# Patient Record
Sex: Male | Born: 1950 | Race: Black or African American | Hispanic: No | Smoking: Former smoker
Health system: Southern US, Community
[De-identification: ages and names within clinical notes are randomized; demographics above are authoritative.]

## PROBLEM LIST (undated history)

## (undated) DIAGNOSIS — R778 Other specified abnormalities of plasma proteins: Secondary | ICD-10-CM

## (undated) DIAGNOSIS — Z992 Dependence on renal dialysis: Secondary | ICD-10-CM

## (undated) DIAGNOSIS — K219 Gastro-esophageal reflux disease without esophagitis: Secondary | ICD-10-CM

## (undated) DIAGNOSIS — N185 Chronic kidney disease, stage 5: Secondary | ICD-10-CM

## (undated) DIAGNOSIS — G4733 Obstructive sleep apnea (adult) (pediatric): Secondary | ICD-10-CM

## (undated) DIAGNOSIS — N529 Male erectile dysfunction, unspecified: Secondary | ICD-10-CM

## (undated) DIAGNOSIS — N400 Enlarged prostate without lower urinary tract symptoms: Secondary | ICD-10-CM

## (undated) DIAGNOSIS — D696 Thrombocytopenia, unspecified: Secondary | ICD-10-CM

## (undated) DIAGNOSIS — N2581 Secondary hyperparathyroidism of renal origin: Secondary | ICD-10-CM

## (undated) DIAGNOSIS — Z9289 Personal history of other medical treatment: Secondary | ICD-10-CM

## (undated) DIAGNOSIS — R7989 Other specified abnormal findings of blood chemistry: Secondary | ICD-10-CM

## (undated) DIAGNOSIS — E669 Obesity, unspecified: Secondary | ICD-10-CM

## (undated) DIAGNOSIS — D649 Anemia, unspecified: Secondary | ICD-10-CM

## (undated) DIAGNOSIS — I10 Essential (primary) hypertension: Secondary | ICD-10-CM

## (undated) HISTORY — DX: Gastro-esophageal reflux disease without esophagitis: K21.9

## (undated) HISTORY — PX: PORTACATH PLACEMENT: SHX2246

## (undated) HISTORY — DX: Male erectile dysfunction, unspecified: N52.9

## (undated) HISTORY — PX: TOTAL HIP ARTHROPLASTY: SHX124

## (undated) HISTORY — DX: Chronic kidney disease, stage 5: N18.5

## (undated) HISTORY — PX: JOINT REPLACEMENT: SHX530

## (undated) HISTORY — PX: COLOSTOMY: SHX63

## (undated) HISTORY — PX: FINGER AMPUTATION: SHX636

## (undated) HISTORY — DX: Benign prostatic hyperplasia without lower urinary tract symptoms: N40.0

## (undated) HISTORY — DX: Obesity, unspecified: E66.9

## (undated) HISTORY — DX: Obstructive sleep apnea (adult) (pediatric): G47.33

## (undated) HISTORY — PX: CHOLECYSTECTOMY: SHX55

---
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
## 2001-05-15 ENCOUNTER — Ambulatory Visit (HOSPITAL_COMMUNITY): Admission: RE | Admit: 2001-05-15 | Discharge: 2001-05-15 | Payer: Self-pay | Admitting: Internal Medicine

## 2001-05-15 ENCOUNTER — Encounter: Payer: Self-pay | Admitting: Internal Medicine

## 2001-06-26 ENCOUNTER — Encounter: Payer: Self-pay | Admitting: Emergency Medicine

## 2001-06-26 ENCOUNTER — Emergency Department (HOSPITAL_COMMUNITY): Admission: EM | Admit: 2001-06-26 | Discharge: 2001-06-26 | Payer: Self-pay | Admitting: Emergency Medicine

## 2002-04-24 ENCOUNTER — Encounter: Payer: Self-pay | Admitting: Internal Medicine

## 2002-04-24 ENCOUNTER — Inpatient Hospital Stay (HOSPITAL_COMMUNITY): Admission: AD | Admit: 2002-04-24 | Discharge: 2002-04-27 | Payer: Self-pay | Admitting: Internal Medicine

## 2002-05-31 ENCOUNTER — Emergency Department (HOSPITAL_COMMUNITY): Admission: EM | Admit: 2002-05-31 | Discharge: 2002-06-01 | Payer: Self-pay | Admitting: Internal Medicine

## 2003-11-18 ENCOUNTER — Emergency Department (HOSPITAL_COMMUNITY): Admission: EM | Admit: 2003-11-18 | Discharge: 2003-11-18 | Payer: Self-pay | Admitting: Emergency Medicine

## 2004-05-14 ENCOUNTER — Emergency Department (HOSPITAL_COMMUNITY): Admission: EM | Admit: 2004-05-14 | Discharge: 2004-05-14 | Payer: Self-pay | Admitting: Emergency Medicine

## 2004-10-12 ENCOUNTER — Inpatient Hospital Stay (HOSPITAL_COMMUNITY): Admission: EM | Admit: 2004-10-12 | Discharge: 2004-10-15 | Payer: Self-pay | Admitting: Emergency Medicine

## 2004-10-21 ENCOUNTER — Ambulatory Visit (HOSPITAL_COMMUNITY): Admission: RE | Admit: 2004-10-21 | Discharge: 2004-10-21 | Payer: Self-pay | Admitting: Internal Medicine

## 2005-01-26 ENCOUNTER — Emergency Department (HOSPITAL_COMMUNITY): Admission: EM | Admit: 2005-01-26 | Discharge: 2005-01-26 | Payer: Self-pay | Admitting: Emergency Medicine

## 2005-04-29 ENCOUNTER — Encounter (INDEPENDENT_AMBULATORY_CARE_PROVIDER_SITE_OTHER): Payer: Self-pay | Admitting: Family Medicine

## 2005-07-02 ENCOUNTER — Encounter (INDEPENDENT_AMBULATORY_CARE_PROVIDER_SITE_OTHER): Payer: Self-pay | Admitting: Family Medicine

## 2005-07-03 ENCOUNTER — Inpatient Hospital Stay (HOSPITAL_COMMUNITY): Admission: EM | Admit: 2005-07-03 | Discharge: 2005-07-06 | Payer: Self-pay | Admitting: *Deleted

## 2005-07-03 ENCOUNTER — Ambulatory Visit: Payer: Self-pay | Admitting: *Deleted

## 2005-07-03 ENCOUNTER — Encounter (INDEPENDENT_AMBULATORY_CARE_PROVIDER_SITE_OTHER): Payer: Self-pay | Admitting: Family Medicine

## 2005-07-13 ENCOUNTER — Ambulatory Visit: Payer: Self-pay | Admitting: *Deleted

## 2005-07-27 ENCOUNTER — Ambulatory Visit: Payer: Self-pay | Admitting: Cardiology

## 2005-08-14 ENCOUNTER — Emergency Department (HOSPITAL_COMMUNITY): Admission: EM | Admit: 2005-08-14 | Discharge: 2005-08-14 | Payer: Self-pay | Admitting: Emergency Medicine

## 2006-06-08 ENCOUNTER — Ambulatory Visit: Payer: Self-pay | Admitting: Family Medicine

## 2006-07-11 ENCOUNTER — Ambulatory Visit: Payer: Self-pay | Admitting: Family Medicine

## 2006-07-12 ENCOUNTER — Encounter (INDEPENDENT_AMBULATORY_CARE_PROVIDER_SITE_OTHER): Payer: Self-pay | Admitting: Family Medicine

## 2006-07-16 ENCOUNTER — Encounter: Payer: Self-pay | Admitting: Family Medicine

## 2006-07-16 DIAGNOSIS — R809 Proteinuria, unspecified: Secondary | ICD-10-CM | POA: Insufficient documentation

## 2006-07-25 ENCOUNTER — Ambulatory Visit: Payer: Self-pay | Admitting: Family Medicine

## 2006-07-26 ENCOUNTER — Encounter (INDEPENDENT_AMBULATORY_CARE_PROVIDER_SITE_OTHER): Payer: Self-pay | Admitting: Family Medicine

## 2006-08-07 DIAGNOSIS — G4733 Obstructive sleep apnea (adult) (pediatric): Secondary | ICD-10-CM

## 2006-08-07 HISTORY — DX: Obstructive sleep apnea (adult) (pediatric): G47.33

## 2006-08-08 ENCOUNTER — Ambulatory Visit: Payer: Self-pay | Admitting: Family Medicine

## 2006-08-17 ENCOUNTER — Encounter (INDEPENDENT_AMBULATORY_CARE_PROVIDER_SITE_OTHER): Payer: Self-pay | Admitting: Family Medicine

## 2006-08-17 ENCOUNTER — Ambulatory Visit: Admission: RE | Admit: 2006-08-17 | Discharge: 2006-08-17 | Payer: Self-pay | Admitting: Family Medicine

## 2006-08-30 ENCOUNTER — Emergency Department (HOSPITAL_COMMUNITY): Admission: EM | Admit: 2006-08-30 | Discharge: 2006-08-30 | Payer: Self-pay | Admitting: Emergency Medicine

## 2006-09-05 ENCOUNTER — Encounter (INDEPENDENT_AMBULATORY_CARE_PROVIDER_SITE_OTHER): Payer: Self-pay | Admitting: Family Medicine

## 2006-09-09 ENCOUNTER — Ambulatory Visit: Payer: Self-pay | Admitting: Pulmonary Disease

## 2006-09-10 ENCOUNTER — Ambulatory Visit: Payer: Self-pay | Admitting: Family Medicine

## 2006-09-18 ENCOUNTER — Encounter (INDEPENDENT_AMBULATORY_CARE_PROVIDER_SITE_OTHER): Payer: Self-pay | Admitting: Family Medicine

## 2006-09-25 ENCOUNTER — Encounter (INDEPENDENT_AMBULATORY_CARE_PROVIDER_SITE_OTHER): Payer: Self-pay | Admitting: Family Medicine

## 2006-10-04 ENCOUNTER — Encounter (INDEPENDENT_AMBULATORY_CARE_PROVIDER_SITE_OTHER): Payer: Self-pay | Admitting: Family Medicine

## 2006-10-08 ENCOUNTER — Telehealth (INDEPENDENT_AMBULATORY_CARE_PROVIDER_SITE_OTHER): Payer: Self-pay | Admitting: Family Medicine

## 2006-10-16 ENCOUNTER — Inpatient Hospital Stay (HOSPITAL_COMMUNITY): Admission: EM | Admit: 2006-10-16 | Discharge: 2006-10-20 | Payer: Self-pay | Admitting: Emergency Medicine

## 2006-10-17 ENCOUNTER — Ambulatory Visit: Payer: Self-pay | Admitting: Cardiovascular Disease

## 2006-10-24 ENCOUNTER — Encounter (INDEPENDENT_AMBULATORY_CARE_PROVIDER_SITE_OTHER): Payer: Self-pay | Admitting: Family Medicine

## 2006-10-29 ENCOUNTER — Ambulatory Visit: Payer: Self-pay | Admitting: Family Medicine

## 2006-11-30 ENCOUNTER — Encounter (INDEPENDENT_AMBULATORY_CARE_PROVIDER_SITE_OTHER): Payer: Self-pay | Admitting: Family Medicine

## 2007-01-15 ENCOUNTER — Emergency Department (HOSPITAL_COMMUNITY): Admission: EM | Admit: 2007-01-15 | Discharge: 2007-01-16 | Payer: Self-pay | Admitting: Emergency Medicine

## 2007-01-24 ENCOUNTER — Emergency Department (HOSPITAL_COMMUNITY): Admission: EM | Admit: 2007-01-24 | Discharge: 2007-01-24 | Payer: Self-pay | Admitting: Emergency Medicine

## 2007-01-24 ENCOUNTER — Telehealth (INDEPENDENT_AMBULATORY_CARE_PROVIDER_SITE_OTHER): Payer: Self-pay | Admitting: Family Medicine

## 2007-01-25 ENCOUNTER — Ambulatory Visit: Payer: Self-pay | Admitting: Family Medicine

## 2007-01-28 ENCOUNTER — Telehealth (INDEPENDENT_AMBULATORY_CARE_PROVIDER_SITE_OTHER): Payer: Self-pay | Admitting: Family Medicine

## 2007-01-29 ENCOUNTER — Telehealth (INDEPENDENT_AMBULATORY_CARE_PROVIDER_SITE_OTHER): Payer: Self-pay | Admitting: *Deleted

## 2007-01-31 ENCOUNTER — Encounter (INDEPENDENT_AMBULATORY_CARE_PROVIDER_SITE_OTHER): Payer: Self-pay | Admitting: Family Medicine

## 2007-01-31 ENCOUNTER — Telehealth (INDEPENDENT_AMBULATORY_CARE_PROVIDER_SITE_OTHER): Payer: Self-pay | Admitting: *Deleted

## 2007-02-01 ENCOUNTER — Telehealth (INDEPENDENT_AMBULATORY_CARE_PROVIDER_SITE_OTHER): Payer: Self-pay | Admitting: Family Medicine

## 2007-02-01 ENCOUNTER — Telehealth (INDEPENDENT_AMBULATORY_CARE_PROVIDER_SITE_OTHER): Payer: Self-pay | Admitting: *Deleted

## 2007-02-05 ENCOUNTER — Telehealth (INDEPENDENT_AMBULATORY_CARE_PROVIDER_SITE_OTHER): Payer: Self-pay | Admitting: *Deleted

## 2007-02-05 ENCOUNTER — Emergency Department (HOSPITAL_COMMUNITY): Admission: EM | Admit: 2007-02-05 | Discharge: 2007-02-05 | Payer: Self-pay | Admitting: Emergency Medicine

## 2007-02-06 ENCOUNTER — Ambulatory Visit: Payer: Self-pay | Admitting: Family Medicine

## 2007-02-06 DIAGNOSIS — Z87898 Personal history of other specified conditions: Secondary | ICD-10-CM | POA: Insufficient documentation

## 2007-02-07 ENCOUNTER — Encounter (INDEPENDENT_AMBULATORY_CARE_PROVIDER_SITE_OTHER): Payer: Self-pay | Admitting: Family Medicine

## 2007-02-07 ENCOUNTER — Telehealth (INDEPENDENT_AMBULATORY_CARE_PROVIDER_SITE_OTHER): Payer: Self-pay | Admitting: Family Medicine

## 2007-02-07 LAB — CONVERTED CEMR LAB
Basophils Relative: 0 % (ref 0–1)
Calcium: 8.7 mg/dL (ref 8.4–10.5)
Chloride: 102 meq/L (ref 96–112)
Eosinophils Absolute: 0.2 10*3/uL (ref 0.0–0.7)
HCT: 47.7 % (ref 39.0–52.0)
Lymphocytes Relative: 17 % (ref 12–46)
Lymphs Abs: 1.5 10*3/uL (ref 0.7–3.3)
MCHC: 31.2 g/dL (ref 30.0–36.0)
MCV: 81.4 fL (ref 78.0–100.0)
Monocytes Absolute: 0.6 10*3/uL (ref 0.2–0.7)
Platelets: 271 10*3/uL (ref 150–400)
Potassium: 3.3 meq/L — ABNORMAL LOW (ref 3.5–5.3)
RBC: 5.86 M/uL — ABNORMAL HIGH (ref 4.22–5.81)
Sodium: 138 meq/L (ref 135–145)
WBC: 9 10*3/uL (ref 4.0–10.5)

## 2007-02-12 ENCOUNTER — Encounter (INDEPENDENT_AMBULATORY_CARE_PROVIDER_SITE_OTHER): Payer: Self-pay | Admitting: Family Medicine

## 2007-02-13 ENCOUNTER — Telehealth (INDEPENDENT_AMBULATORY_CARE_PROVIDER_SITE_OTHER): Payer: Self-pay | Admitting: *Deleted

## 2007-02-14 ENCOUNTER — Encounter (INDEPENDENT_AMBULATORY_CARE_PROVIDER_SITE_OTHER): Payer: Self-pay | Admitting: Family Medicine

## 2007-02-15 ENCOUNTER — Encounter (INDEPENDENT_AMBULATORY_CARE_PROVIDER_SITE_OTHER): Payer: Self-pay | Admitting: Family Medicine

## 2007-02-18 ENCOUNTER — Encounter (INDEPENDENT_AMBULATORY_CARE_PROVIDER_SITE_OTHER): Payer: Self-pay | Admitting: Family Medicine

## 2007-02-20 ENCOUNTER — Encounter (INDEPENDENT_AMBULATORY_CARE_PROVIDER_SITE_OTHER): Payer: Self-pay | Admitting: Family Medicine

## 2007-02-27 ENCOUNTER — Encounter (INDEPENDENT_AMBULATORY_CARE_PROVIDER_SITE_OTHER): Payer: Self-pay | Admitting: Family Medicine

## 2007-03-14 ENCOUNTER — Telehealth (INDEPENDENT_AMBULATORY_CARE_PROVIDER_SITE_OTHER): Payer: Self-pay | Admitting: Family Medicine

## 2007-03-24 ENCOUNTER — Encounter (INDEPENDENT_AMBULATORY_CARE_PROVIDER_SITE_OTHER): Payer: Self-pay | Admitting: Family Medicine

## 2007-03-28 ENCOUNTER — Ambulatory Visit: Payer: Self-pay | Admitting: Family Medicine

## 2007-04-02 ENCOUNTER — Emergency Department (HOSPITAL_COMMUNITY): Admission: EM | Admit: 2007-04-02 | Discharge: 2007-04-02 | Payer: Self-pay | Admitting: Emergency Medicine

## 2007-04-24 ENCOUNTER — Telehealth (INDEPENDENT_AMBULATORY_CARE_PROVIDER_SITE_OTHER): Payer: Self-pay | Admitting: Family Medicine

## 2007-04-26 ENCOUNTER — Ambulatory Visit: Payer: Self-pay | Admitting: Family Medicine

## 2007-04-26 ENCOUNTER — Telehealth (INDEPENDENT_AMBULATORY_CARE_PROVIDER_SITE_OTHER): Payer: Self-pay | Admitting: *Deleted

## 2007-04-30 ENCOUNTER — Ambulatory Visit: Payer: Self-pay | Admitting: Family Medicine

## 2007-05-01 ENCOUNTER — Encounter (INDEPENDENT_AMBULATORY_CARE_PROVIDER_SITE_OTHER): Payer: Self-pay | Admitting: Family Medicine

## 2007-05-01 ENCOUNTER — Telehealth (INDEPENDENT_AMBULATORY_CARE_PROVIDER_SITE_OTHER): Payer: Self-pay | Admitting: *Deleted

## 2007-05-02 ENCOUNTER — Ambulatory Visit (HOSPITAL_COMMUNITY): Admission: RE | Admit: 2007-05-02 | Discharge: 2007-05-02 | Payer: Self-pay | Admitting: Family Medicine

## 2007-05-31 ENCOUNTER — Encounter (INDEPENDENT_AMBULATORY_CARE_PROVIDER_SITE_OTHER): Payer: Self-pay | Admitting: Family Medicine

## 2007-06-19 ENCOUNTER — Ambulatory Visit: Payer: Self-pay | Admitting: Family Medicine

## 2007-07-19 ENCOUNTER — Encounter (INDEPENDENT_AMBULATORY_CARE_PROVIDER_SITE_OTHER): Payer: Self-pay | Admitting: Family Medicine

## 2007-07-20 LAB — CONVERTED CEMR LAB
AST: 10 units/L (ref 0–37)
Albumin: 4.3 g/dL (ref 3.5–5.2)
Alkaline Phosphatase: 150 units/L — ABNORMAL HIGH (ref 39–117)
Basophils Absolute: 0 10*3/uL (ref 0.0–0.1)
Basophils Relative: 0 % (ref 0–1)
CO2: 25 meq/L (ref 19–32)
Chloride: 103 meq/L (ref 96–112)
Creatinine, Ser: 2.53 mg/dL — ABNORMAL HIGH (ref 0.40–1.50)
HCT: 44.5 % (ref 39.0–52.0)
HDL: 47 mg/dL (ref >39)
Hemoglobin: 14 g/dL (ref 13.0–17.0)
LDL Cholesterol: 70 mg/dL (ref 0–99)
MCV: 77 fL — ABNORMAL LOW (ref 78.0–100.0)
PSA: 1.54 ng/mL (ref 0.10–4.00)
Platelets: 238 10*3/uL (ref 150–400)
Potassium: 3.8 meq/L (ref 3.5–5.3)
RBC: 5.78 M/uL (ref 4.22–5.81)
RDW: 18 % — ABNORMAL HIGH (ref 11.5–15.5)
Sodium: 143 meq/L (ref 135–145)
TSH: 1.123 microintl units/mL (ref 0.350–5.50)
Total CHOL/HDL Ratio: 2.8
Triglycerides: 64 mg/dL (ref <150)

## 2007-07-22 ENCOUNTER — Telehealth (INDEPENDENT_AMBULATORY_CARE_PROVIDER_SITE_OTHER): Payer: Self-pay | Admitting: *Deleted

## 2007-08-27 ENCOUNTER — Encounter (INDEPENDENT_AMBULATORY_CARE_PROVIDER_SITE_OTHER): Payer: Self-pay | Admitting: Family Medicine

## 2007-08-28 ENCOUNTER — Ambulatory Visit: Payer: Self-pay | Admitting: Family Medicine

## 2007-08-28 ENCOUNTER — Telehealth (INDEPENDENT_AMBULATORY_CARE_PROVIDER_SITE_OTHER): Payer: Self-pay | Admitting: *Deleted

## 2007-08-31 ENCOUNTER — Emergency Department (HOSPITAL_COMMUNITY): Admission: EM | Admit: 2007-08-31 | Discharge: 2007-08-31 | Payer: Self-pay | Admitting: Emergency Medicine

## 2007-09-03 ENCOUNTER — Telehealth (INDEPENDENT_AMBULATORY_CARE_PROVIDER_SITE_OTHER): Payer: Self-pay | Admitting: Family Medicine

## 2007-09-03 ENCOUNTER — Encounter (INDEPENDENT_AMBULATORY_CARE_PROVIDER_SITE_OTHER): Payer: Self-pay | Admitting: Family Medicine

## 2007-09-05 ENCOUNTER — Encounter (INDEPENDENT_AMBULATORY_CARE_PROVIDER_SITE_OTHER): Payer: Self-pay | Admitting: Family Medicine

## 2007-09-20 ENCOUNTER — Encounter (INDEPENDENT_AMBULATORY_CARE_PROVIDER_SITE_OTHER): Payer: Self-pay | Admitting: Family Medicine

## 2007-09-27 ENCOUNTER — Ambulatory Visit: Payer: Self-pay | Admitting: Family Medicine

## 2007-10-13 ENCOUNTER — Encounter (INDEPENDENT_AMBULATORY_CARE_PROVIDER_SITE_OTHER): Payer: Self-pay | Admitting: Family Medicine

## 2007-10-16 ENCOUNTER — Telehealth (INDEPENDENT_AMBULATORY_CARE_PROVIDER_SITE_OTHER): Payer: Self-pay | Admitting: *Deleted

## 2007-10-17 ENCOUNTER — Encounter (INDEPENDENT_AMBULATORY_CARE_PROVIDER_SITE_OTHER): Payer: Self-pay | Admitting: Family Medicine

## 2007-12-06 ENCOUNTER — Ambulatory Visit: Payer: Self-pay | Admitting: Family Medicine

## 2007-12-24 ENCOUNTER — Telehealth (INDEPENDENT_AMBULATORY_CARE_PROVIDER_SITE_OTHER): Payer: Self-pay | Admitting: *Deleted

## 2008-02-05 ENCOUNTER — Encounter (INDEPENDENT_AMBULATORY_CARE_PROVIDER_SITE_OTHER): Payer: Self-pay | Admitting: Family Medicine

## 2008-02-24 ENCOUNTER — Telehealth (INDEPENDENT_AMBULATORY_CARE_PROVIDER_SITE_OTHER): Payer: Self-pay | Admitting: *Deleted

## 2008-03-09 ENCOUNTER — Ambulatory Visit: Payer: Self-pay | Admitting: Family Medicine

## 2008-03-09 LAB — CONVERTED CEMR LAB
Nitrite: NEGATIVE
Protein, U semiquant: >=300
Specific Gravity, Urine: 1.015
pH: 5.5

## 2008-03-10 ENCOUNTER — Encounter (INDEPENDENT_AMBULATORY_CARE_PROVIDER_SITE_OTHER): Payer: Self-pay | Admitting: Family Medicine

## 2008-03-11 LAB — CONVERTED CEMR LAB
BUN: 28 mg/dL — ABNORMAL HIGH (ref 6–23)
Basophils Absolute: 0 10*3/uL (ref 0.0–0.1)
Chloride: 103 meq/L (ref 96–112)
Eosinophils Absolute: 0.1 10*3/uL (ref 0.0–0.7)
Eosinophils Relative: 2 % (ref 0–5)
Hemoglobin: 13.9 g/dL (ref 13.0–17.0)
Lymphocytes Relative: 17 % (ref 12–46)
MCV: 75.8 fL — ABNORMAL LOW (ref 78.0–100.0)
Monocytes Absolute: 0.6 10*3/uL (ref 0.1–1.0)
Monocytes Relative: 7 % (ref 3–12)
Neutro Abs: 6.3 10*3/uL (ref 1.7–7.7)
Platelets: 275 10*3/uL (ref 150–400)
Sodium: 140 meq/L (ref 135–145)
WBC: 8.6 10*3/uL (ref 4.0–10.5)

## 2008-03-27 ENCOUNTER — Telehealth (INDEPENDENT_AMBULATORY_CARE_PROVIDER_SITE_OTHER): Payer: Self-pay | Admitting: *Deleted

## 2008-04-22 ENCOUNTER — Ambulatory Visit: Payer: Self-pay | Admitting: Family Medicine

## 2008-05-11 ENCOUNTER — Ambulatory Visit: Payer: Self-pay | Admitting: Family Medicine

## 2008-06-29 ENCOUNTER — Telehealth (INDEPENDENT_AMBULATORY_CARE_PROVIDER_SITE_OTHER): Payer: Self-pay | Admitting: *Deleted

## 2008-07-20 ENCOUNTER — Ambulatory Visit: Payer: Self-pay | Admitting: Family Medicine

## 2008-07-22 ENCOUNTER — Encounter (INDEPENDENT_AMBULATORY_CARE_PROVIDER_SITE_OTHER): Payer: Self-pay | Admitting: Family Medicine

## 2008-07-24 LAB — CONVERTED CEMR LAB
AST: 17 units/L (ref 0–37)
Albumin: 3.9 g/dL (ref 3.5–5.2)
BUN: 24 mg/dL — ABNORMAL HIGH (ref 6–23)
CO2: 28 meq/L (ref 19–32)
Calcium: 9.1 mg/dL (ref 8.4–10.5)
Creatinine, Ser: 1.95 mg/dL — ABNORMAL HIGH (ref 0.40–1.50)
Glucose, Bld: 87 mg/dL (ref 70–99)
HDL: 49 mg/dL (ref >39)
LDL Cholesterol: 72 mg/dL (ref 0–99)
PSA: 1.72 ng/mL (ref 0.10–4.00)
Potassium: 3.6 meq/L (ref 3.5–5.3)
Sodium: 138 meq/L (ref 135–145)

## 2008-08-17 ENCOUNTER — Ambulatory Visit: Payer: Self-pay | Admitting: Family Medicine

## 2008-10-05 ENCOUNTER — Ambulatory Visit: Payer: Self-pay | Admitting: Family Medicine

## 2008-11-16 ENCOUNTER — Ambulatory Visit: Payer: Self-pay | Admitting: Family Medicine

## 2008-11-17 ENCOUNTER — Encounter (INDEPENDENT_AMBULATORY_CARE_PROVIDER_SITE_OTHER): Payer: Self-pay | Admitting: Family Medicine

## 2008-11-19 LAB — CONVERTED CEMR LAB
BUN: 28 mg/dL — ABNORMAL HIGH (ref 6–23)
CO2: 20 meq/L (ref 19–32)
Calcium: 8.5 mg/dL (ref 8.4–10.5)
Creatinine, Ser: 2.37 mg/dL — ABNORMAL HIGH (ref 0.40–1.50)
Eosinophils Absolute: 0.2 10*3/uL (ref 0.0–0.7)
HCT: 42.5 % (ref 39.0–52.0)
Hemoglobin: 13.7 g/dL (ref 13.0–17.0)
Lymphocytes Relative: 19 % (ref 12–46)
MCHC: 32.2 g/dL (ref 30.0–36.0)
MCV: 75.1 fL — ABNORMAL LOW (ref 78.0–100.0)
Monocytes Relative: 9 % (ref 3–12)
Neutrophils Relative %: 69 % (ref 43–77)
RBC: 5.66 M/uL (ref 4.22–5.81)
RDW: 16.2 % — ABNORMAL HIGH (ref 11.5–15.5)
WBC: 8.1 10*3/uL (ref 4.0–10.5)

## 2008-11-23 ENCOUNTER — Telehealth (INDEPENDENT_AMBULATORY_CARE_PROVIDER_SITE_OTHER): Payer: Self-pay | Admitting: *Deleted

## 2008-11-23 ENCOUNTER — Ambulatory Visit: Payer: Self-pay | Admitting: Family Medicine

## 2008-11-24 ENCOUNTER — Encounter (INDEPENDENT_AMBULATORY_CARE_PROVIDER_SITE_OTHER): Payer: Self-pay | Admitting: Family Medicine

## 2008-11-25 LAB — CONVERTED CEMR LAB
Iron: 72 ug/dL (ref 42–165)
Saturation Ratios: 27 % (ref 20–55)
TIBC: 271 ug/dL (ref 215–435)
UIBC: 199 ug/dL

## 2008-11-27 ENCOUNTER — Ambulatory Visit (HOSPITAL_COMMUNITY): Admission: RE | Admit: 2008-11-27 | Discharge: 2008-11-27 | Payer: Self-pay | Admitting: Family Medicine

## 2008-11-30 ENCOUNTER — Ambulatory Visit: Payer: Self-pay | Admitting: Family Medicine

## 2008-11-30 ENCOUNTER — Encounter (INDEPENDENT_AMBULATORY_CARE_PROVIDER_SITE_OTHER): Payer: Self-pay | Admitting: *Deleted

## 2008-12-01 ENCOUNTER — Encounter (INDEPENDENT_AMBULATORY_CARE_PROVIDER_SITE_OTHER): Payer: Self-pay | Admitting: Family Medicine

## 2008-12-02 ENCOUNTER — Encounter (INDEPENDENT_AMBULATORY_CARE_PROVIDER_SITE_OTHER): Payer: Self-pay | Admitting: *Deleted

## 2008-12-16 ENCOUNTER — Encounter (INDEPENDENT_AMBULATORY_CARE_PROVIDER_SITE_OTHER): Payer: Self-pay | Admitting: Family Medicine

## 2008-12-29 ENCOUNTER — Telehealth (INDEPENDENT_AMBULATORY_CARE_PROVIDER_SITE_OTHER): Payer: Self-pay | Admitting: *Deleted

## 2009-01-11 ENCOUNTER — Encounter (INDEPENDENT_AMBULATORY_CARE_PROVIDER_SITE_OTHER): Payer: Self-pay | Admitting: Family Medicine

## 2009-01-15 ENCOUNTER — Ambulatory Visit: Payer: Self-pay | Admitting: Family Medicine

## 2009-01-15 DIAGNOSIS — J309 Allergic rhinitis, unspecified: Secondary | ICD-10-CM | POA: Insufficient documentation

## 2009-01-16 ENCOUNTER — Encounter (INDEPENDENT_AMBULATORY_CARE_PROVIDER_SITE_OTHER): Payer: Self-pay | Admitting: Family Medicine

## 2009-01-18 LAB — CONVERTED CEMR LAB
Alkaline Phosphatase: 98 units/L (ref 39–117)
BUN: 25 mg/dL — ABNORMAL HIGH (ref 6–23)
Basophils Absolute: 0 10*3/uL (ref 0.0–0.1)
Basophils Relative: 0 % (ref 0–1)
Eosinophils Absolute: 0.3 10*3/uL (ref 0.0–0.7)
Eosinophils Relative: 3 % (ref 0–5)
Glucose, Bld: 97 mg/dL (ref 70–99)
HCT: 42.3 % (ref 39.0–52.0)
Hemoglobin: 14.4 g/dL (ref 13.0–17.0)
MCHC: 34 g/dL (ref 30.0–36.0)
Monocytes Absolute: 0.9 10*3/uL (ref 0.1–1.0)
RDW: 16.2 % — ABNORMAL HIGH (ref 11.5–15.5)
Total Bilirubin: 0.5 mg/dL (ref 0.3–1.2)

## 2009-01-22 ENCOUNTER — Ambulatory Visit: Payer: Self-pay | Admitting: Family Medicine

## 2009-01-22 DIAGNOSIS — R718 Other abnormality of red blood cells: Secondary | ICD-10-CM | POA: Insufficient documentation

## 2009-01-22 DIAGNOSIS — E876 Hypokalemia: Secondary | ICD-10-CM | POA: Insufficient documentation

## 2009-01-23 ENCOUNTER — Encounter (INDEPENDENT_AMBULATORY_CARE_PROVIDER_SITE_OTHER): Payer: Self-pay | Admitting: Family Medicine

## 2009-01-25 ENCOUNTER — Encounter (INDEPENDENT_AMBULATORY_CARE_PROVIDER_SITE_OTHER): Payer: Self-pay | Admitting: Family Medicine

## 2009-01-25 LAB — CONVERTED CEMR LAB
Potassium: 3.6 meq/L (ref 3.5–5.3)
Sodium, Ur: 91 meq/L

## 2009-02-17 ENCOUNTER — Ambulatory Visit: Payer: Self-pay | Admitting: Family Medicine

## 2009-02-18 ENCOUNTER — Encounter (INDEPENDENT_AMBULATORY_CARE_PROVIDER_SITE_OTHER): Payer: Self-pay | Admitting: Family Medicine

## 2009-03-09 LAB — CONVERTED CEMR LAB: Uric Acid, Serum: 9.5 mg/dL — ABNORMAL HIGH (ref 4.0–7.8)

## 2009-03-17 ENCOUNTER — Telehealth (INDEPENDENT_AMBULATORY_CARE_PROVIDER_SITE_OTHER): Payer: Self-pay | Admitting: *Deleted

## 2009-04-05 ENCOUNTER — Encounter (INDEPENDENT_AMBULATORY_CARE_PROVIDER_SITE_OTHER): Payer: Self-pay | Admitting: Family Medicine

## 2009-04-16 ENCOUNTER — Ambulatory Visit: Payer: Self-pay | Admitting: Family Medicine

## 2009-05-15 ENCOUNTER — Emergency Department (HOSPITAL_COMMUNITY): Admission: EM | Admit: 2009-05-15 | Discharge: 2009-05-15 | Payer: Self-pay | Admitting: Emergency Medicine

## 2010-02-22 ENCOUNTER — Emergency Department (HOSPITAL_COMMUNITY): Admission: EM | Admit: 2010-02-22 | Discharge: 2010-02-22 | Payer: Self-pay | Admitting: Emergency Medicine

## 2010-03-04 ENCOUNTER — Emergency Department (HOSPITAL_COMMUNITY): Admission: EM | Admit: 2010-03-04 | Discharge: 2010-03-04 | Payer: Self-pay | Admitting: Emergency Medicine

## 2010-04-18 ENCOUNTER — Emergency Department (HOSPITAL_COMMUNITY)
Admission: EM | Admit: 2010-04-18 | Discharge: 2010-04-18 | Payer: Self-pay | Source: Home / Self Care | Admitting: Emergency Medicine

## 2010-07-15 ENCOUNTER — Ambulatory Visit: Payer: Self-pay | Admitting: Vascular Surgery

## 2010-08-25 ENCOUNTER — Ambulatory Visit
Admission: RE | Admit: 2010-08-25 | Discharge: 2010-08-25 | Payer: Self-pay | Source: Home / Self Care | Attending: Vascular Surgery | Admitting: Vascular Surgery

## 2010-08-25 ENCOUNTER — Ambulatory Visit: Admit: 2010-08-25 | Payer: Self-pay | Admitting: Vascular Surgery

## 2010-08-26 NOTE — Assessment & Plan Note (Signed)
OFFICE VISIT  ZEBULAN, HINSHAW DOB:  21-Jan-1951                                       08/25/2010 UXLKG#:[id number]  CHIEF COMPLAINT:  Needs dialysis access.  HISTORY OF PRESENT ILLNESS:  The patient is a 60 year old male referred by Dr. Kathrene Bongo for evaluation and placement of hemodialysis access. He is currently not on hemodialysis.  He has previously had no other access procedures.  He is right-handed.  CHRONIC MEDICAL PROBLEMS:  Include hypertension, obstructive sleep apnea, obesity and gout.  These are all currently controlled and followed by Dr. Kathrene Bongo and the patient's primary care doctor, Marvell Fuller.  There are several pages of medical records from Dr. Kathrene Bongo that were reviewed today as well as lab work, which shows his most recent serum creatinine was 4.3.  This was dated 05/27/2010.  SOCIAL HISTORY:  He is single, has 3 children.  He is a nonsmoker, nonconsumer of alcohol.  FAMILY HISTORY:  Unremarkable for vascular disease at a young age.  REVIEW OF SYSTEMS:  Full 12 point review of systems were performed with the patient today.  Please see intake referral form for details.  All review of systems were negative.  MEDICATIONS:  Include Norvasc, clonidine, triamterene, hydrochlorothiazide, aspirin, metoprolol, allopurinol, lisinopril, Kapidex, calcitriol.  ALLERGIES:  He has an allergy listed to penicillin.  PHYSICAL EXAM:  Vital signs:  Blood pressure is 152/98 in the left arm, heart rate 65 and regular, oxygen saturation is 99%.  Temperature is 98.4.  HEENT:  Unremarkable.  Neck:  Has 2+ carotid pulses without bruit.  Chest:  Clear to auscultation.  Cardiac:  Regular rate and rhythm without murmur.  Abdomen:  Soft, nontender, nondistended. Musculoskeletal:  Shows no major joint deformities.  Neurologic:  He has symmetric upper extremity and lower extremity motor strength which is 5/5.  Skin:  Has no open ulcers or  rashes.  He has 2+ brachial and radial pulses bilaterally.  He has an easily palpable forearm vein on placement of a tourniquet.  He had a vein mapping ultrasound today which I reviewed which shows the vein is greater than 3 mm in diameter throughout the left forearm, greater than 4 mm in diameter throughout the right forearm.  Basilic vein was between 50 and 70 mm diameter on the right and 50 mm in diameter on the left.  I believe the best option for the patient at this point would be placement of a right radiocephalic fistula.  Although he is right-handed his right vein is of larger caliber and should be better for a fistula creation.  The risks, benefits, possible complications and procedure details of fistula creation including but not limited to bleeding, infection, nonmaturation of the fistula were explained to the patient today.  He understands and agrees to proceed.  This fistula placement is scheduled within the next 2 weeks.    Janetta Hora. Fields, MD Electronically Signed  CEF/MEDQ  D:  08/25/2010  T:  08/26/2010  Job:  [id number]  cc:   Cecille Aver, M.D.

## 2010-09-09 NOTE — Procedures (Unsigned)
CEPHALIC VEIN MAPPING  INDICATION:  Chronic kidney disease.  HISTORY:  EXAM: The right cephalic vein is compressible.  Diameter measurements range from 0.71 to 0.36 cm.  The right basilic vein is compressible.  Diameter measurements range from 0.72 to 0.47 cm.  The left cephalic vein is compressible.  Diameter measurements range from 0.56 to 0.50 cm.  The left basilic vein is compressible.  Diameter measurements range from 0.41 to 0.24 cm.  See attached worksheet for all measurements.  IMPRESSION:  Patent right and left cephalic and basilic veins with diameter measurements as described above.  ___________________________________________ Janetta Hora. Fields, MD  OD/MEDQ  D:  08/25/2010  T:  08/25/2010  Job:  [id number]

## 2010-09-12 ENCOUNTER — Ambulatory Visit (HOSPITAL_COMMUNITY): Payer: Medicare HMO

## 2010-09-12 ENCOUNTER — Ambulatory Visit (HOSPITAL_COMMUNITY)
Admission: RE | Admit: 2010-09-12 | Discharge: 2010-09-12 | Disposition: A | Discharge status: Home / Self Care | Payer: Medicare HMO | Source: Ambulatory Visit | Attending: Vascular Surgery | Admitting: Vascular Surgery

## 2010-09-12 DIAGNOSIS — Z01818 Encounter for other preprocedural examination: Secondary | ICD-10-CM | POA: Insufficient documentation

## 2010-09-12 DIAGNOSIS — N186 End stage renal disease: Secondary | ICD-10-CM | POA: Insufficient documentation

## 2010-09-12 DIAGNOSIS — Z79899 Other long term (current) drug therapy: Secondary | ICD-10-CM | POA: Insufficient documentation

## 2010-09-12 DIAGNOSIS — Z01812 Encounter for preprocedural laboratory examination: Secondary | ICD-10-CM | POA: Insufficient documentation

## 2010-09-12 DIAGNOSIS — I120 Hypertensive chronic kidney disease with stage 5 chronic kidney disease or end stage renal disease: Secondary | ICD-10-CM

## 2010-09-12 LAB — CBC
HCT: 40.6 % (ref 39.0–52.0)
MCH: 25.3 pg — ABNORMAL LOW (ref 26.0–34.0)
MCV: 77.9 fL — ABNORMAL LOW (ref 78.0–100.0)
Platelets: 234 10*3/uL (ref 150–400)
RBC: 5.21 MIL/uL (ref 4.22–5.81)
RDW: 15.3 % (ref 11.5–15.5)
WBC: 10.9 10*3/uL — ABNORMAL HIGH (ref 4.0–10.5)

## 2010-09-12 LAB — BASIC METABOLIC PANEL
BUN: 41 mg/dL — ABNORMAL HIGH (ref 6–23)
Chloride: 103 mEq/L (ref 96–112)
Creatinine, Ser: 5.13 mg/dL — ABNORMAL HIGH (ref 0.4–1.5)
Glucose, Bld: 107 mg/dL — ABNORMAL HIGH (ref 70–99)
Potassium: 3.7 mEq/L (ref 3.5–5.1)

## 2010-09-12 LAB — SURGICAL PCR SCREEN: Staphylococcus aureus: NEGATIVE

## 2010-09-28 NOTE — Op Note (Signed)
  NAME:  Matthew Kane, Matthew Kane NO.:  [id number]  MEDICAL RECORD NO.:  [id number]           PATIENT TYPE:  O  LOCATION:  SDSC                         FACILITY:  [facility]  PHYSICIAN:  Janetta Hora. Jetta Murray, MD  DATE OF BIRTH:  01-27-1951  DATE OF PROCEDURE:  09/12/2010 DATE OF DISCHARGE:  09/12/2010                              OPERATIVE REPORT   PROCEDURE:  Right radiocephalic AV fistula.  PREOPERATIVE DIAGNOSIS:  End-stage renal disease.  POSTOPERATIVE DIAGNOSIS:  End-stage renal disease.  ANESTHESIA:  Local with IV sedation.  ASSISTANT:  Lilli Few, PA-C.  OPERATIVE FINDINGS: 1. A 3-mm cephalic vein. 2. A 2.5-mm radial artery.  OPERATIVE DETAILS:  After obtaining informed consent, the patient was taken to the operating room.  The patient was placed in supine position on the operating table.  After adequate sedation, the patient's entire right upper extremity was prepped and draped in usual sterile fashion. Local anesthesia was infiltrated midway between the cephalic vein and radial artery.  A longitudinal incision was made in this location and carried down through subcutaneous tissues down to the level of cephalic vein.  The cephalic vein was dissected free circumferentially.  Small side branches were ligated and divided between silk ties.  The vein was approximately 3 mm in diameter.  Next, the radial artery was dissected free in the  medial portion incision.  This had some slight thickening, was approximately 2.5 mm in diameter.  This was dissected free circumferentially and small side branches ligated and divided between silk ties or clips.  The patient was then given 5000 units of intravenous heparin.  Small bulldog clamps were used to control the artery proximally and distally. The distal cephalic vein was ligated with a 2-0 silk tie and the vein transected and swung over the level of the artery.  The vein was gently distended with heparinized saline and  flushed thoroughly.  Dilators were passed down the vein, and this was found to accept up to a 3.5-mm dilator.  Next, a longitudinal opening was made in the radial artery. The vein was cut to length and sewn end of vein to side of artery using a running 7-0 Prolene suture.  Just prior to completion, the anastomosis was forebled and back bled and thoroughly flushed.  Anastomosis was secured.  Clamps released.  There was palpable thrill in the proximal fistula immediately.  Hemostasis was obtained.  Doppler was used to evaluate the fistula, and there was good Doppler flow in the fistula up to the midforearm.  Next, the subcutaneous tissues were reapproximated using running 3-0 Vicryl suture.  The skin was closed with a 4-0 Vicryl subcuticular stitch.  The patient tolerated the procedure well and there were no complications.  Instrument, sponge, and needle counts were correct at the end of the case.  The patient was taken to the recovery room in stable condition.     Janetta Hora. Wandy Bossler, MD     CEF/MEDQ  D:  09/12/2010  T:  09/13/2010  Job:  [id number]  Electronically Signed by Fabienne Bruns MD on 09/28/2010 03:08:13 PM

## 2010-09-29 ENCOUNTER — Ambulatory Visit (INDEPENDENT_AMBULATORY_CARE_PROVIDER_SITE_OTHER): Payer: Medicare HMO

## 2010-09-29 DIAGNOSIS — N186 End stage renal disease: Secondary | ICD-10-CM

## 2010-09-29 NOTE — Assessment & Plan Note (Signed)
OFFICE VISIT  COLTON, TASSIN DOB:  [birth year]                                       09/29/2010 ZOXWR#:[id number]  Patient is a 60 year old gentleman who is not yet on hemodialysis but has end-stage renal disease, who had a right Cimino fistula placed on September 12, 2010.  He returns today for a follow-up visit.  He has no signs of steal, no numbness, tingling in the right hand.  He can use his right hand without difficulty.  PHYSICAL EXAMINATION:  Vital signs:  Heart rate 91, saturations 96, respiratory rate is 10.  Right upper extremity is warm and pink.  He has a good grip.  He has palpable distal pulses.  He has a good thrill and bruit in the Cimino fistula, which is already easily palpable.  ASSESSMENT/PLAN:  Functioning right Cimino fistula with no signs of steal.  Plan is to have him come back in 4-6 weeks with Dr. Darrick Penna for a final check.  Della Goo, PA-C  Charles E. Fields, MD Electronically Signed  RR/MEDQ  D:  09/29/2010  T:  09/29/2010  Job:  [id number]

## 2010-10-20 LAB — BASIC METABOLIC PANEL
CO2: 27 mEq/L (ref 19–32)
Calcium: 8.6 mg/dL (ref 8.4–10.5)
Creatinine, Ser: 4.48 mg/dL — ABNORMAL HIGH (ref 0.4–1.5)
GFR calc Af Amer: 16 mL/min — ABNORMAL LOW (ref >60)
Glucose, Bld: 135 mg/dL — ABNORMAL HIGH (ref 70–99)

## 2010-10-27 ENCOUNTER — Ambulatory Visit (INDEPENDENT_AMBULATORY_CARE_PROVIDER_SITE_OTHER): Payer: Medicare HMO | Admitting: Vascular Surgery

## 2010-10-27 DIAGNOSIS — N186 End stage renal disease: Secondary | ICD-10-CM

## 2010-10-28 NOTE — Assessment & Plan Note (Signed)
OFFICE VISIT  Matthew Kane, Matthew Kane DOB:  1950/12/16                                       10/27/2010 [id number]#:[id number]  The patient returns for followup today.  He had a right radiocephalic AV fistula placed on 21/30/8657.  He is currently not on dialysis.  He denies any numbness or tingling in his right hand.  He has been exercising the fistula.  PHYSICAL EXAM:  Vital signs:  Blood pressure is 177/118 in the left arm, heart rate is 69 and regular.  Temperature is 97.9.  Right upper extremity has an easily palpable thrill over the fistula.  The proximal 3-4 cm seems to be developing well but it is more difficult to palpate in the forearm.  He has no evidence of steal on physical exam.  At this point I believe the patient should continue to exercise his fistula.  We will see him back in 2 months' time with a duplex ultrasound to see if the fistula has matured as far as depth and diameter are concerned.  Hopefully this will continue to develop over time but it is fairly small at this point.  He will see me again in 2 months.    Janetta Hora. Fields, MD Electronically Signed  CEF/MEDQ  D:  10/27/2010  T:  10/28/2010  Job:  [id number]  cc:   Cecille Aver, M.D.

## 2010-11-24 ENCOUNTER — Encounter: Payer: Self-pay | Admitting: Cardiology

## 2010-12-23 NOTE — Cardiovascular Report (Signed)
NAME:  Matthew Kane, Matthew Kane NO.:  [id number]   MEDICAL RECORD NO.:  [id number]          PATIENT TYPE:  INP   LOCATION:  3728                         FACILITY:  [facility]   PHYSICIAN:  Jonelle Sidle, M.D. LHCDATE OF BIRTH:  [birth year]   DATE OF PROCEDURE:  07/05/2005  DATE OF DISCHARGE:                              CARDIAC CATHETERIZATION   PRIMARY CARE PHYSICIAN:  Dr. Carylon Perches   PRIMARY CARDIOLOGIST:  Dr. Vida Roller   INDICATION:  Mr. Kimura is a 60 year old male with a history of renal  insufficiency, hypertension, and recent presentation with chest pain.  He  had minor troponin I elevations in the range of 0.15 suggesting possible non-  ST elevation myocardial infarction.  He has been hydrated and received  Mucomyst and has a creatinine of 2 on the day of catheterization.  Selective  coronary angiography has been requested to outline the coronary anatomy.  The risks and potential benefits of the procedure have been explained to the  patient and informed consent was obtained prior to proceeding.   PROCEDURES PERFORMED:  Selective coronary angiography.   ACCESS AND EQUIPMENT:  The area about the right femoral artery was  anesthetized with 1% lidocaine and a 6-French sheath was placed in the right  femoral artery via the modified Seldinger technique.  Standard preformed 6-  [place] JL4 and JR4 catheters were used for selective coronary angiography.  All exchanges were made over a wire.  Hydralazine 20 mg IV and labetalol 10  mg IV were given during the procedure to obtain better better blood pressure  control.  The patient tolerated the procedure well without immediate  complications.   HEMODYNAMIC RESULTS:  Aorta 155/96 (following treatment).   ANGIOGRAPHIC FINDINGS:  1.  In general, the coronary arteries are very large and were difficult to      completely opacify.  2.  The left main coronary artery is large and free of significant flow-      limiting  coronary atherosclerosis.  This vessel gives rise to left      anterior descending, the circumflex, and a small ramus intermedius      branch.  3.  The left anterior descending is a large caliber vessel with a large      bifurcating proximal diagonal branch that is followed by small diagonal      branch.  The vessel bifurcates towards the apex.  Minor luminal      irregularities are noted but there is no obvious flow-limiting stenosis      noted.  4.  There is a small ramus intermedius branch without significant flow-      limiting stenosis.  5.  The circumflex coronary artery is a large vessel that provides four      obtuse marginal branches, the second of which is the largest and      bifurcates.  Minor luminal irregularities are noted without flow-      limiting stenosis.  6.  The right coronary artery is a large dominant vessel with large      posterior descending branch.  There is a  right ventricular marginal      branch noted proximally.  No significant flow-limiting coronary      atherosclerosis is noted with only minor luminal irregularities.   DIAGNOSIS:  No significant flow-limiting coronary atherosclerosis noted in  the major epicardial vessels.  The patient's coronary arteries are very  large and were difficult to completely opacify, although there looks to be  only minor luminal irregularities without any stenotic atherosclerosis.   DISCUSSION:  I reviewed the results with the patient.  Would anticipate  further titration of antihypertensive therapy and general risk factor  modification.  The patient will receive continued hydration following the  procedure with follow-up BMET in the morning.           ______________________________  Jonelle Sidle, M.D. [facility]     SGM/MEDQ  D:  07/05/2005  T:  07/06/2005  Job:  [id number]   cc:   Kingsley Callander. Ouida Sills, MD  Fax: [phone]   Vida Roller, M.D.  Fax: [phone]

## 2010-12-23 NOTE — Discharge Summary (Signed)
NAME:  Matthew Kane, Matthew Kane NO.:  [id number]   MEDICAL RECORD NO.:  [id number]          PATIENT TYPE:  INP   LOCATION:  A320                          FACILITY:  [facility]   PHYSICIAN:  Osvaldo Shipper, MD     DATE OF BIRTH:  Apr 03, 1951   DATE OF ADMISSION:  10/16/2006  DATE OF DISCHARGE:  03/15/2008LH                               DISCHARGE SUMMARY   DISCHARGE DIAGNOSES:  1. Chest pain of unclear etiology, negative workup, resolved.  2. Poorly controlled hypertension.   Please refer H&P dictated at the time of admission for details regarding  patient's presenting illness.   BRIEF HOSPITAL COURSE:  Briefly, this is a 60 year old African-American  male with a history of hypertension who presented with right-sided chest  pain. It was noted that patient actually had a cardiac catheterization  about two years which did not show any coronary artery disease. His EKG  showed some left axis deviation and possible Q waves in the  anterolateral leads. The patient ruled out for acute coronary syndrome.  He was seen by [facility] who performed inpatient stress test  which was negative. He also had a mildly abnormal D-dimer for which he  underwent a VQ scan which was low probability. His chest patient had  resolved within a few hours of arriving to the ED. He had to wait three  days to undergo VQ scan because he underwent a Cardiolite scan for the  stress test. The patient's blood pressure was not optimally controlled  and hence required adjustment to some of his medications. On the day of  discharge, his vital signs were improved, and he was considered stable  for discharge.   DISCHARGE MEDICATIONS:  1. Norvasc 10 mg p.o. daily.  2. Clonidine 0.3 mg b.i.d.  3. Enteric-coated aspirin 81 mg daily.  4. Maxzide 1 tablet daily.  5. He was also on lisinopril which he was asked to continue.   Other instructions are provided by Dr. Sherle Poe who discharged this  patient.   FOLLOW UP:  He was asked to follow up with his primary medical doctor.   Apparently, this discharge took less than 30 minutes.      Osvaldo Shipper, MD  Electronically Signed    GK/MEDQ  D:  10/22/2006  T:  10/22/2006  Job:  [id number]

## 2010-12-23 NOTE — H&P (Signed)
NAME:  Matthew Kane, Matthew Kane NO.:  [id number]   MEDICAL RECORD NO.:  [id number]          PATIENT TYPE:  INP   LOCATION:  NA                           FACILITY:  [facility]   PHYSICIAN:  Kingsley Callander. Ouida Sills, MD       DATE OF BIRTH:  Mar 11, 1951   DATE OF ADMISSION:  07/03/2005  DATE OF DISCHARGE:  LH                                HISTORY & PHYSICAL   CHIEF COMPLAINT:  Chest pain.   HISTORY OF PRESENT ILLNESS:  This patient is a 60 year old African-American  male who developed substernal chest pain after awakening from sleep and  getting up to go to the bathroom.  His pain persisted and lasted only about  10 minutes after presenting to the emergency room and receiving treatment  with sublingual nitroglycerin.  He did not have radiation of pain.  He had a  tightness in the anterior chest.  There was no diaphoresis or vomiting.  He  had mild shortness of breath.  He has a history of hypertension.  He has  been poorly compliant with followup.  He also has chronic renal failure.  He  has not had diabetes.  He does not smoke.  He was last seen by me in March  when he was hospitalized for pneumonia.   He was to have been taking Norvasc, Avapro, Lasix, and Toprol XL for  hypertension then but is not taking them now.  He was quite hypertensive on  presentation to the emergency room.  His initial EKG was abnormal but  appears consistent with one from 2000.  It is changed, though, from one  between 2000 and now.  His initial cardiac markers were abnormal.   PAST MEDICAL HISTORY:  1.  Status post hip replacements.  2.  Hypertension.  3.  Chronic renal failure.  4.  History of hypokalemia.  5.  History of pneumonia.   MEDICATIONS:  Unclear.   ALLERGIES:  PENICILLIN and LOTREL.   FAMILY HISTORY:  His father died of lung cancer at 19.  His mother died at  3 of a heart attack.  A brother died in a car accident.   SOCIAL HISTORY:  He does not smoke or drink.   REVIEW OF SYSTEMS:  No  recent exertional chest pain.  No change in bowel  habits or difficulty voiding.  No fevers or cough.   PHYSICAL EXAMINATION:  VITAL SIGNS:  Temperature 98.1, pulse 80, blood  pressure initially 184/130, respirations 20, O2 saturation 97% on room air.  GENERAL:  Alert, oriented, and in no distress.  HEENT:  No scleral icterus.  Pharynx is unremarkable.  NECK:  No JVD or thyromegaly.  LUNGS: Clear.  HEART:  Regular with no murmurs.  ABDOMEN: Obese, nontender.  No hepatosplenomegaly.  EXTREMITIES:  No cyanosis, clubbing, or edema.  NEUROLOGIC:  At baseline.   LABORATORY DATA:  White count 10.4, hemoglobin 16, platelets 205,000.  Sodium 137, potassium 2.9, bicarb 33, glucose 128, BUN 19, creatinine 2.2,  calcium 8.8.  Troponin I's were 0.15, 0.12, and 0.07.  CK-MBs were 5.7, 5.3,  and 2.2.  Chest x-ray reveals a tortuous aorta but no acute infiltrate.   His EKG reveals normal sinus rhythm with anterior ST segment elevation,  probable left ventricular hypertrophy and nonspecific T wave changes.   IMPRESSION:  1.  Chest pain.  His EKG changes appear chronic.  His initial cardiac      markers are abnormal.  He will be hospitalized in the CCU.  A [facility] consultation will be obtained. His case was discussed with      Dr. Dorethea Clan.  2.  Hypertension.  He has been started initially on Lopressor in the      emergency room.  Will re-add his medications.  He has previously been on      Norvasc 10, Lasix 80, Toprol XL 200, Avapro 300/hydrochlorothiazide      12.5, and clonidine 0.1 twice daily at the time of his last office      visit.  3.  Hypokalemia.  We will supplement orally.  4.  Chronic renal failure, stable.  5.  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      Kingsley Callander. Ouida Sills, MD  Electronically Signed     ROF/MEDQ  D:  07/03/2005  T:  07/03/2005  Job:  [id number]

## 2010-12-23 NOTE — H&P (Signed)
NAME:  Matthew Kane, Matthew Kane NO.:  [id number]   MEDICAL RECORD NO.:  [id number]          PATIENT TYPE:  INP   LOCATION:  A302                          FACILITY:  [facility]   PHYSICIAN:  Kingsley Callander. Ouida Sills, MD       DATE OF BIRTH:  May 29, 1951   DATE OF ADMISSION:  10/12/2004  DATE OF DISCHARGE:  LH                                HISTORY & PHYSICAL   CHIEF COMPLAINT:  Cough and shortness of breath.   HISTORY OF PRESENT ILLNESS:  This patient is a 60 year old African-American  male who presented to the emergency room with a three-day history of cough,  shortness of breath and a sense of increased difficulty breathing.  He was  found to have an oxygen saturation initially of 89%.  He had experienced  fever to 101.  He was coughing up yellow sputum.  He is a nonsmoker.  His  initial chest x-ray revealed increased vascular markings with no area of  consolidation.   PAST MEDICAL HISTORY:  1.  Status post left total hip replacement.  2.  Hypertension.  3.  Chronic renal failure.  4.  Hypokalemia.   MEDICATIONS:  He is unclear of which medications he has recently used.  When  I last saw him in the office in September 2004, he was to have been taking:   1.  Norvasc 10 mg a day.  2.  Lasix 80 mg a day.  3.  Toprol XL 200 mg a day.  4.  Avapro 300/12.5 mg a day.  5.  Clonidine 0.1 mg b.i.d.   ALLERGIES:  LOTREL and PENICILLIN.   SOCIAL HISTORY:  He lives with his wife.  He does not use alcohol, tobacco  or drugs.   FAMILY HISTORY:  His father died of lung cancer at age 41.  His mother died  of a heart attack at 75.   REVIEW OF SYSTEMS:  He describes an episode of vomiting one day prior to  admission.  He has not had chest pain, abdominal pain or difficulty voiding.   PHYSICAL EXAMINATION:  VITAL SIGNS:  Temperature 100.8, pulse 112,  respirations 24, blood pressure 179/79.  GENERAL:  An ill-appearing, overweight male.  HEENT:  The conjunctivae are mildly red.  Pharynx is  unremarkable.  NECK:  Supple without thyromegaly or JVD.  LUNGS:  Right basilar rales.  CARDIAC:  Tachycardic with no murmurs.  ABDOMEN:  Obese, nontender, no hepatosplenomegaly.  EXTREMITIES:  No calf tenderness, no cyanosis, clubbing or edema.  NEUROLOGIC:  Grossly intact.   LABORATORY DATA:  An ABG on 2 L revealed a pH of 7.43, PCO2 47 and a PO2 of  63.  White count 11.8, hemoglobin 15.1, platelets 201, 91 segs, 3 lymphs, 5  monos.  Sodium 134, potassium 2.8, bicarb 30, glucose 121, BUN 12,  creatinine 1.9, calcium 8.5.  BNP less than 30.  Urinalysis revealed 11-20  white cells, 7-10 red cells, with greater than 300 mg/dl of protein.  His  EKG revealed sinus rhythm with PVCs.   IMPRESSION:  1.  Probable pneumonia.  He has a  fever, leukocytosis and hypoxia.  He will      be treated with supplemental oxygen and IV antibiotics, including      ceftriaxone and Zithromax.  A sputum culture will be obtained, blood      cultures were drawn in the ER.  2.  Possible urinary tract infection.  Check culture.  3.  Hypertension.  Restart his usual medications.  4.  Chronic renal failure.  Stable.  5.  Hypokalemia.  Will supplement intravenously and orally.  6.  Status post left hip replacement.      ROF/MEDQ  D:  10/13/2004  T:  10/13/2004  Job:  [id number]

## 2010-12-23 NOTE — Discharge Summary (Signed)
NAME:  Matthew Kane, Matthew Kane NO.:  [id number]   MEDICAL RECORD NO.:  [id number]          PATIENT TYPE:  INP   LOCATION:  3728                         FACILITY:  [facility]   PHYSICIAN:  Pricilla Riffle, M.D.    DATE OF BIRTH:  Nov 07, 1950   DATE OF ADMISSION:  07/04/2005  DATE OF DISCHARGE:  07/06/2005                                 DISCHARGE SUMMARY   DISCHARGE DIAGNOSES:  1.  Hypertension, uncontrolled.  2.  Chest pain with minor troponin I elevations in the range of 0.15      suggesting possible non-ST segment elevation myocardial infarction      status post cardiac catheterization on July 05, 2005, by Dr. Dionicio Stall showing no significant flow limiting coronary atherosclerosis      noted in the major epicardial vessels.  3.  Renal insufficiency with creatinine of 2.2 precatheterization which      apparently has been noted in patient's past medical history.  4.  Elevated hemoglobin A1c 6.7.   PAST MEDICAL HISTORY:  1.  Renal insufficiency.  2.  Hypertension.  3.  Benign prostatic hypertrophy.  4.  History of pneumonia March 2006 with questionable history of sleep      apnea.  5.  History of total hip replacement in 2001 at [facility].  6.  Status post bilateral total hip replacements.  7.  Status post cholecystectomy.   ALLERGIES:  LOTREL and PENICILLIN.   PROCEDURE:  Cardiac catheterization on July 05, 2005, done by Dr. Simona Huh.   HOSPITAL COURSE:  Matthew Kane is a 60 year old African American gentleman  with past medical history significant for hypertension and renal  insufficiency who initially presented to [facility] with complaints  of chest discomfort that awoke him from sleep on the day of admission.  Blood pressure at that time was 184/130.  He was given sublingual  nitroglycerin which relieved his chest discomfort.  Since admission, his  pains did not return.  Patient stated he was not taking his medications for  the  last several months.  He was previously on four or five blood pressure  medications.  Initial blood work showed a sodium of 3.2.  Hemoglobin 16,  hematocrit 47.  BUN 22, creatinine 2.  LFTs within normal limits. Cardiac  enzymes showing a troponin of 0.17 and 0.17.  TSH within normal limits.  Patient was admitted for further evaluation at [facility].  We were  asked to consult. Patient had echocardiogram  done on July 03, 2005, at  [facility] prior to transporting to [facility].  Patient found to have normal systolic function with EF 50 to 55%.  There was inferior septal hypokinesis with moderate concentric left  ventricular hypertrophy.  It was felt patient needed further evaluation.  Patient was transferred to [facility] for cardiac  catheterization.  Patient was hydrated with fluids prior to procedure, taken  to the catheterization lab, results as stated above.  Patient tolerated the  procedure without complications .  Post catheterization,  cath site stable  without hematoma.  Blood pressure 165/116.  Patient being discharged home to  follow up with Dr. Dorethea Clan in [place].   Lab work this admission includes a lipid panel with total cholesterol 146,  triglycerides 198, HDL 35 and LDL of 71.  Prior to discharge, hemoglobin  13.9 with hematocrit 41.4.  BMET on July 06, 2005, with sodium 139,  potassium 3.8, glucose 79, BUN 18, creatinine 1.8.  Hemoglobin A1c 6.7.   Chest x-ray:  Stable chest without acute interval changes.   DISPOSITION:  Patient has been given [facility] post cardiac  catheterization discharge instructions. He can return to work on Monday,  July 10, 2005.  He needs to follow a low salt, low sugar diet.  He may  shower, no tub bathing x2 days.   MEDICATIONS:  1.  Norvasc 5 mg p.o. daily.  2.  Catapres 0.2 mg patch daily.  3.  Lopressor 50 mg p.o. b.i.d.  4.  Aspirin 325 mg daily.  5.   Prinizide 20/12.5 mg daily.   I scheduled him a follow-up appointment with Amy at [facility] July 13, 2005, at 2 p.m. at which time he will have his blood  pressure rechecked, a BMET drawn and a post cath check.  Follow-up  appointment with Dr. Dorethea Clan on July 21, 2005, at 1:45.   DURATION OF DISCHARGE:  30 minutes.      Dorian Pod, NP    ______________________________  Pricilla Riffle, M.D.    MB/MEDQ  D:  07/06/2005  T:  07/07/2005  Job:  [id number]   cc:   Kingsley Callander. Ouida Sills, MD  Fax: [phone]   Vida Roller, M.D.  Fax: [phone]

## 2010-12-23 NOTE — Procedures (Signed)
NAME:  Matthew Kane, DAKIN NO.:  [id number]   MEDICAL RECORD NO.:  [id number]          PATIENT TYPE:  OUT   LOCATION:  SLEEP LAB                     FACILITY:  [facility]   PHYSICIAN:  Barbaraann Share, MD,FCCPDATE OF BIRTH:  Mar 23, 1951   DATE OF STUDY:  08/17/2006                            NOCTURNAL POLYSOMNOGRAM   REFERRING PHYSICIAN:  Franchot Heidelberg MD   INDICATION FOR THE STUDY:  Hypersomnia with sleep apnea.   EPWORTH SCORE:  17.   SLEEP ARCHITECTURE:  The patient had total sleep time of 272 minutes  with decreased REM and no slow wave sleep.  The patient had a normal  sleep onset latency but onset of REM was prolonged at 255 minutes.  Sleep efficiency was very poor at 65%.   RESPIRATORY DATA:  The patient underwent split night protocol where he  was found to have 162 obstructive events in the first 98 minutes of  sleep.  This gave him a respiratory disturbance index over that time  period of 100 events per hour.  The events were not positional and there  was loud snoring noted throughout.  By protocol, the patient was then  placed on a medium Respironics full face mask and ultimately titrated to  12 cmH2O with what appears to be excellent control of the obstructive  events.   OXYGEN DATA:  There was O2 desaturation as low as 75% with the patient's  apnea.   CARDIAC DATA:  Frequent PVCs were noted throughout.   MOVEMENT/PARASOMNIA:  The patient was found to have 94 leg jerks with  less than one per hour resulting in arousal or awakening.  This is  considered clinically insignificant.   IMPRESSION/RECOMMENDATIONS:  1. Split night study reveals very severe obstructive sleep      apnea/hypopnea syndrome with a respiratory disturbance index of 100      events per hour and O2 desaturation as low as 75% in the first half      of the night.  The patient was then placed on CPAP with a medium      Respironics full face mask and titrated to a final pressure of   12 cm with excellent control.  2. Frequent PVCs noted throughout, however, none known appeared to be      overly clinically significant.      Barbaraann Share, MD,FCCP  Diplomate, American Board of Sleep  Medicine  Electronically Signed     [facility]/MEDQ  D:  09/07/2006 15:25:14  T:  09/07/2006 17:15:21  Job:  [id number]

## 2010-12-23 NOTE — H&P (Signed)
NAME:  Matthew Kane, Matthew Kane NO.:  [id number]   MEDICAL RECORD NO.:  [id number]           PATIENT TYPE:   LOCATION:                                 FACILITY:   PHYSICIAN:  [place] Bing, M.D. [facility] OF BIRTH:  Sep 08, 1950   DATE OF ADMISSION:  07/04/2005  DATE OF DISCHARGE:                                HISTORY & PHYSICAL   REFERRING PHYSICIAN:   HISTORY OF PRESENT ILLNESS:  Matthew Kane is a 60 year old gentleman with past  medical history significant for hypertension and renal insufficiency who was  admitted to [facility] with complaints of chest discomfort.  He  described substernal severe chest discomfort that awakened him from sleep  the day of admission.  He had no associated shortness of breath.  Elevated  blood pressure upon admission at 184/130.  He was given sublingual  nitroglycerin relieving his chest discomfort.  Since admission his pain has  not returned.   PAST MEDICAL HISTORY:  1.  Renal insufficiency.  2.  Hypertension.  3.  BPH.  4.  History of pneumonia March of 2006 with questionable history of sleep      apnea.  5.  Gives a history of a cardiac evaluation prior to his total hip      replacement in 2001 at [facility] in [place].  6.  Status post bilateral total hip replacement.  7.  Status post cholecystectomy.   ALLERGIES:  LOTREL, PENICILLIN.   MEDICATIONS PRIOR TO ADMISSION:  Patient was not taking his medications for  the last several months.  He was supposed to be on four to five blood  pressure medications and potassium supplement at an unknown dose.   MEDICATIONS IN THE HOSPITAL:  1.  Aspirin 325 mg daily.  2.  Lopressor 50 mg q.6h.  3.  KCl 20 mEq q.i.d. x8 doses.  4.  IV normal saline 30 mL/hour.  5.  Nitroglycerin as needed.  6.  Morphine as needed.   SOCIAL HISTORY:  Mr. Pantano lives in [place] with his wife.  He works in  the school system.  He has grown children.  Minimal tobacco history with  cessation 30 years ago.  Denies any illicit drug use or alcohol use.   FAMILY HISTORY:  Mother is deceased at 54 years old secondary to MI.  Father  is deceased at 43 years old secondary to lung cancer.   REVIEW OF SYSTEMS:  CONSTITUTIONAL:  Negative for fevers and chills.  HEENT:  Positive for headaches after taking nitroglycerin.  No dizziness or  congestion.  SKIN:  No rashes or lesions.  CARDIOPULMONARY:  See HPI.  GU:  Positive for frequency and nocturia up to six to seven times per night.  No  dysuria or straining.  NEURO/PSYCH:  No weakness or numbness.  MUSCULOSKELETAL:  No arthralgias or myalgias.  GI:  No nausea, vomiting,  diarrhea, or blood per rectum or abdominal discomfort.   All other systems reviewed are negative except per HPI.   PHYSICAL EXAMINATION:  VITAL SIGNS:  Temperature 98.4, pulse 66,  respirations 24, blood pressure 143/101, oxygen  saturation 96% on 2 L,  weight 292 pounds.  GENERAL:  Well-developed, well-nourished male in no acute distress.  HEENT:  Normocephalic, atraumatic.  Pupils are equal, round, and reactive to  light.  NECK:  No carotid bruits.  No jugular venous distention.  CARDIOVASCULAR:  He has a regular rate and rhythm.  Normal S1 and S2,  positive S4.  LUNGS:  Clear to auscultation bilaterally.  ABDOMEN:  Obese, soft.  Active bowel sounds.  EXTREMITIES:  No clubbing, cyanosis, edema.  Distal pulses are intact  bilaterally.  NEUROLOGIC:  He is alert and oriented x3 and grossly nonfocal.   Chest x-ray reveals no active disease.  Electrocardiogram reveals sinus  rhythm, at 70 beats per minute, left axis deviation and some ST elevation in  the anterior leads, T-wave inversion in the lateral leads, normal PR  interval, QRS duration, and QTC.   LABORATORY DATA:  White blood cells 10.4, hemoglobin 16, hematocrit 47,  platelets 205.  INR 0.9.  Sodium 129, potassium 3.2, chloride 96,  bicarbonate 29, glucose 131, BUN 22, creatinine 2, calcium 8.1.   LFTs within  normal limits with mildly low albumin at 3.1.  Cardiac enzymes:  Total CK #1  151, #2 153, CK-MB #1 5.5, #2 6.1, troponin 0.17 and 0.17.  TSH within  normal limits.   IMPRESSION AND PLAN:  1.  Chest discomfort relieved by nitroglycerin, abnormal electrocardiogram,      mildly abnormal cardiac enzymes.  Continue serial cardiac enzymes.      Further evaluation with echocardiogram has been performed.  This is      pending at the time of this dictation.  We will transfer him to [facility] for further evaluation with a cardiac catheterization once      his renal function stabilizes and his blood pressure has decreased.  2.  Hypertension.  Elevated blood pressure upon admission has improved with      treatment with nitroglycerin drip and Lopressor.  He is also on Norvasc      5 mg daily.  Will continue to monitor and add additional      antihypertensives as necessary.  3.  Renal insufficiency has improved somewhat since admission.  Continue      hydration.  Will likely need bicarbonate protocol prior to cardiac      catheterization.   Patient was interviewed and examined by Dr. Donnamarie Rossetti.  He agrees with  above assessment and plan.     Amy Mercy Riding, P.A. [facility]      [place] Bing, M.D. [facility]  Electronically Signed   AB/MEDQ  D:  07/04/2005  T:  07/04/2005  Job:  [id number]

## 2010-12-23 NOTE — Discharge Summary (Signed)
NAME:  Matthew Kane, Matthew Kane NO.:  [id number]   MEDICAL RECORD NO.:  [id number]          PATIENT TYPE:  INP   LOCATION:  A302                          FACILITY:  [facility]   PHYSICIAN:  Kingsley Callander. Ouida Sills, MD       DATE OF BIRTH:  [birth year]   DATE OF ADMISSION:  10/12/2004  DATE OF DISCHARGE:  03/11/2006LH                                 DISCHARGE SUMMARY   DISCHARGE DIAGNOSES:  1.  Community-acquired pneumonia.  2.  Hypertension.  3.  Chronic renal failure.  4.  Hypokalemia.   HOSPITAL COURSE:  This patient is a 60 year old male who presented with  cough and shortness of breath.  His oxygen saturation was 89%.  He was  febrile to 101 degrees.  His white count was 11.8.  He was hospitalized and  treated for pneumonia with IV Rocephin and Zithromax.  His chest x-ray  revealed a right lower lobe infiltrate.  Blood cultures have been negative.  His urinalysis revealed white cells and red cells, but his culture is  negative.  His symptoms improved.  His fever resolved.  He was felt to be  stable for discharge on October 15, 2004.  His antihypertensive regimen was  reinitiated.  His hypokalemia was corrected.  He will follow up in the  office in 1 week.   DISCHARGE MEDICATIONS:  1.  Ceftin 500 mg b.i.d. for 7 days.  2.  Norvasc 10 mg daily.  3.  Avapro 300 mg daily.  4.  Lasix 80 mg daily.  5.  Potassium 20 mEq b.i.d.  6.  Toprol 100 mg daily.      ROF/MEDQ  D:  10/15/2004  T:  10/15/2004  Job:  [id number]

## 2010-12-23 NOTE — Consult Note (Signed)
NAME:  Matthew Kane, Matthew Kane NO.:  [id number]   MEDICAL RECORD NO.:  [id number]          PATIENT TYPE:  INP   LOCATION:  A210                          FACILITY:  [facility]   PHYSICIAN:  Noralyn Pick. Eden Emms, MD, FACCDATE OF BIRTH:  [birth year]   DATE OF CONSULTATION:  10/17/2006  DATE OF DISCHARGE:                                 CONSULTATION   Matthew Kane is a 60 year old patient admitted early this morning by  [facility] for chest pain.  The pain is somewhat atypical, it was  central, there was no pleuritic component, there was some radiation to  the shoulders.  The pain lasted for about an hour.  He has a previous  history of cath in 2006 with no epicardial coronary disease; at the time  his troponins were elevated.  He has coronary risk factors which include  significant hypertension and family history of coronary disease.   The patient is currently pain-free.  His troponins are mildly elevated  and his CPKs are negative.  He had labile T-wave changes in his EKG with  occasional PVCs.   Of note, his blood pressure was significantly elevated on admission.   PAST MEDICAL HISTORY:  1. Mild renal insufficiency with creatinines around 2.  2. BP.  3. Has had previous history of pneumonia.  4. Had a heart cath in 2006 which had no epicardial coronary disease.  5. Status post bilateral hip replacements.  6. Status post cholecystectomy.   HE IS ALLERGIC TO:  1. LOTREL.  2. PENICILLIN.   MEDICATION LIST:  Is appended in the chart.  He was supposed to be  taking:  1. An aspirin a day.  2. Clonidine 0.2 mg b.i.d.  3. Lisinopril/hydrochlorothiazide 20/12.5.   He lives in [place] with his wife.  He had previously worked in the  school system.  He has grown children.  He quit tobacco 30 years ago.  He does not use drug or alcohol.   His mother died at age 19 of an MI, father died at age 53 of lung  cancer.   REVIEW OF SYSTEMS:  Otherwise negative.  In particular, he has not  had a  fever or productive cough, there has been no history of PE or DVT.   EXAM:  Currently, the blood pressure is 160/88, pulse is 64 and regular.  HEENT:  Normal.  There is no carotid bruits.  LUNGS:  Clear.  There is an S1, S2 with an S4 gallop.  ABDOMEN:  Benign.  He is status post bilateral hip replacement.  His  distal pulses are intact with no edema.   LAB RESULTS:  Remarkable for a hematocrit of 42.9, white count is normal  at 9.2.  His potassium is 3.5.  His BUN is 25, creatinine is 2.2, CPKs  are negative, 228 and 166, with relative indices less than 4.  The BNP  is less than 30.  His troponins are mildly elevated at 0.13 and 0.12.   His chest x-ray shows cardiomegaly with low lung volumes, no pulmonary  edema.   IMPRESSION:  Somewhat atypical chest pain in a  60 year old with normal  cath in 2006, although he has labile T wave changes and elevated  troponin and his CPKs are not worrisome, and he had a cath in 2006 with  no epicardial or coronary disease.  Given his creatinine of 2.2, I think  it would be reasonable to start with an adenosine Myoview study.  We  will try to schedule this for tomorrow since there are already six cases  scheduled for today.  In regards to his current therapy, it would be  worthwhile to hold his diuretic given his renal insufficiency.  He may  need further workup for renal artery stenosis in terms of his  hypertension, this may be important in regards to him being on  lisinopril as well.   He will have a 2D echocardiogram today.  If he has abnormal wall motion  we will start a beta-blocker and consider hydration and transfer to [facility]  for cath.  As long as his LV function is normal, we will plan on doing a  Myoview study in the morning.   He will continue subcu Lovenox.      Noralyn Pick. Eden Emms, MD, [facility]  Electronically Signed     PCN/MEDQ  D:  10/17/2006  T:  10/18/2006  Job:  [id number]

## 2010-12-23 NOTE — Consult Note (Signed)
NAME:  Matthew Kane, Matthew Kane NO.:  [id number]   MEDICAL RECORD NO.:  [id number]          PATIENT TYPE:  INP   LOCATION:  IC07                          FACILITY:  [facility]   PHYSICIAN:  Vida Roller, M.D.   DATE OF BIRTH:  1951-07-22   DATE OF CONSULTATION:  07/03/2005  DATE OF DISCHARGE:                                   CONSULTATION   CARDIOLOGY CONSULTATION:   PRIMARY CARE PHYSICIAN:  Dr. Carylon Perches   HISTORY OF PRESENT ILLNESS:  Matthew Kane is a 60 year old gentleman seen in  consultation for evaluation of chest discomfort and abnormal cardiac  markers.  His past medical history is significant for hypertension and  chronic renal insufficiency.  He presented to the emergency department last  evening with complaints of substernal chest discomfort.  He reports the  chest pain felt like a hammer beating in his chest.  This started at rest  last evening and intermittently awoke him from sleep during the night.  He  also notes that yesterday during the day he had intermittent ache in  bilateral arms.  In the emergency department, he was given three baby  aspirin and two to three sublingual nitroglycerin with relief of his pain.  He has had no recurrence of his pain since admission.  His blood pressure on  admission was elevated markedly at 184/130.  He states he has not been  taking his blood pressure medicines for the last several months.  He has had  a diagnosis of hypertension for the last 10 years and has been on  medications for 10 years up until approximately 3 months ago.   Patient does report some dyspnea on exertion which he feels is chronic.  He  denies any orthopnea, PND, he has occasional palpitations, denies any  presyncope or syncope, denies any shortness of breath at rest, denies  coughing or wheezing.   PAST MEDICAL HISTORY:  Sleep apnea unclear how this has been diagnosed; he  states he has not had a sleep study and does not use CPAP.  Chronic renal  insufficiency.  Hypertension.  BPH.  History of pneumonia in March 2006  status post total hip replacement in 2001 and status post cholecystectomy.  He had a stress test prior to his total hip replacement at [facility]  [place] that was negative per his report.  Otherwise, he has had no cardiac workup.   SOCIAL HISTORY:  Matthew Kane lives in [place] with his wife.  He works for  the school system.  He has healthy grown children.  He has minimal history  of tobacco abuse with cessation 30 years ago.  Denies any illicit drug use  or alcohol use.  Does not follow any exercise program or diet.   FAMILY HISTORY:  Mother is deceased at 3 years old secondary to MI.  Father  is deceased at 17 years old secondary to lung cancer.   MEDICATIONS PRIOR TO ADMISSION:  Toprol 50 mg daily.  Three or four other  blood pressure pills that he cannot recall.  Potassium at an  unknown dose.  Patient states he has not taken any of his medications in several months.   MEDICATIONS IN THE HOSPITAL:  Aspirin 325 mg daily, Lopressor 50 mg q.6 h.,  KCl 20 mEq q.i.d. x8 doses, IV normal saline at 30 mL/hr, nitroglycerin as  needed and morphine as needed.   ALLERGIES:  LOTREL and PENICILLIN.   REVIEW OF SYSTEMS:  CONSTITUTIONAL:  No fevers or chills.  HEENT:  Headache  since taking nitroglycerin.  No dizziness or congestion.  SKIN:  No rashes  or lesions.  CARDIOPULMONARY:  Per HPI.  GU:  Positive frequency and  nocturia up to six to seven times per night.  No dysuria or straining.  NEUROPSYCH:  No weakness or numbness.  MUSCULOSKELETAL:  No myalgias or  arthralgias.  GI:  No nausea, vomiting, diarrhea, bright red blood per  rectum, no abdominal discomfort.   All other systems reviewed are negative except for HPI.   PHYSICAL EXAMINATION:  VITAL SIGNS:  Blood pressure is 162/107, pulse is 72  and regular, respirations 18, oxygen saturation is 98% on 2 L/min.  GENERAL:   Well-developed, well-nourished male in no acute distress.  HEENT:  Normocephalic, atraumatic.  Pupils equal, round and reactive to  light and extraocular movements are intact.  NECK:  Neck is supple with no JVD, no carotid bruits, no lymphadenopathy is  noted.  CARDIOVASCULAR:  He has a regular rate and rhythm, normal S1 and S2,  positive S4.  RESPIRATORY:  His lungs are clear to auscultation bilaterally.  SKIN:  Skin is warm and dry with no rashes or lesions noted.  ABDOMEN:  Abdomen is obese, soft, nontender, with active bowel sounds and no  hepatosplenomegaly is noted.  GU AND RECTAL:  Exams deferred.  EXTREMITIES:  No cyanosis, clubbing, or edema.  Distal pulses are intact  bilaterally.  No joint deformity is noted.  NEUROLOGIC:  He is alert and oriented x3.  Grossly nonfocal exam.   CHEST X-RAY:  No acute disease.   ELECTROCARDIOGRAM:  Sinus rhythm at 73 beats per minute, left axis  deviation, normal intervals, ST elevation in the anterior leads and some T  wave inversion in lateral leads.   LABORATORY DATA:  White blood cells 10.4, hemoglobin 16, hematocrit 47,  platelets 205.  Sodium 137, potassium 2.9, chloride 100, bicarb 33, BUN 19,  creatinine 2.2 and glucose 128.  Total bilirubin 0.5, alk phos 89, AST 22,  ALT 20, total protein 6.5, and albumin 3.1.  INR is 0.9.  Calcium of 8.8.  Point-of-care cardiac markers:  Myoglobin (1) 205, (2) 223, and (3) 292.  Troponins (1) 0.15, (2) 0.12, and (3) 0.07.   IMPRESSION AND PLAN:  Non-ST elevation myocardial infarction in setting of  severe hypertension secondary to medical noncompliance.  Chest discomfort  was relieved by nitroglycerin and has not returned.  Blood pressure is still  quite elevated.  We will start intravenous nitroglycerin to help with this.  He will need further evaluation with a cardiac catheterization.  We will  transfer him to [facility] for further evaluation of this.  We will obtain a 2-D echocardiogram and  serial cardiac enzymes for further evaluation.  Considering his renal insufficiency and  hypertension, we will need to hydrate Matthew Kane and treat his hypertension  prior to sending him to the cardiac catheterization lab.   We appreciate this consult and be happy to follow this patient along with  you.      Amy Mercy Riding, P.A. [facility]  Vida Roller, M.D.  Electronically Signed    AB/MEDQ  D:  07/03/2005  T:  07/03/2005  Job:  [id number]

## 2010-12-23 NOTE — Procedures (Signed)
NAME:  Matthew Kane, Matthew Kane NO.:  [id number]   MEDICAL RECORD NO.:  [id number]          PATIENT TYPE:  INP   LOCATION:  IC07                          FACILITY:  [facility]   PHYSICIAN:  Vida Roller, M.D.   DATE OF BIRTH:  05-23-1951   DATE OF PROCEDURE:  07/03/2005  DATE OF DISCHARGE:                                  ECHOCARDIOGRAM   REFERRING PHYSICIAN:  Dr. Carylon Perches.   Tape number [id number]. Tape count 860 through 1329. This is a man with chest  pain and hypertension. Technical quality of the study is difficult.   M-MODE TRACING:  Aorta is 37 mm.   Left atrium is 43 mm.   Septum is 18 mm.   Posterior wall is 14 mm.   Left ventricular diastolic dimension is 51 mm.   Left ventricular systolic dimension is 36 mm.   TWO-D AND DOPPLER IMAGING:  Left ventricle is normal size. There is a low  normal ejection fraction of 50-55%. There is inferior septal hypokinesis  with moderate concentric left ventricular hypertrophy.   Right ventricle is normal size with normal systolic function.   Both atria are enlarged.   The aortic valve is sclerotic with no stenosis or regurgitation.   The mitral valve has mild annular calcification and mild regurgitation.   Tricuspid valve with trace regurgitation.   No pericardial effusion.      Vida Roller, M.D.  Electronically Signed     JH/MEDQ  D:  07/03/2005  T:  07/04/2005  Job:  [id number]   cc:   Kingsley Callander. Ouida Sills, MD  Fax: [phone]

## 2010-12-23 NOTE — Discharge Summary (Signed)
NAME:  Matthew Kane, Matthew Kane NO.:  [id number]   MEDICAL RECORD NO.:  [id number]                   PATIENT TYPE:  INP   LOCATION:  A319                                 FACILITY:  [facility]   PHYSICIAN:  Kingsley Callander. Ouida Sills, M.D.                  DATE OF BIRTH:  21-Aug-1950   DATE OF ADMISSION:  04/24/2002  DATE OF DISCHARGE:  04/27/2002                                 DISCHARGE SUMMARY   DISCHARGE DIAGNOSES:  1. Anasarca.  2. Status post recent left hip replacement.  3. Chronic renal failure.  4. Severe hypertension.  5. Postoperative anemia.  6. Ongoing anticoagulation.   HOSPITAL COURSE:  This patient is a 60 year old male who presented with  massive swelling of his arms, legs, hands, and feet.  He had recently  undergone a left hip replacement at [facility].  He had been taking  possible contributing medications including indomethacin, Minoxidil, and  Lotrel.  He has had an elevated creatinine of 2.2.  Indomethacin was  stopped.  His creatinine improved to 2.0.  Minoxidil was stopped.  He had  previously tolerated Minoxidil without swelling.  He had recently been on  clindamycin also which was stopped.  His left hip is healing well although  the staples remain in place and will be removed by the home health nurse on  May 01, 2002.   He was treated with IV Lasix.  His swelling improved.  His repeat  electrolytes revealed a potassium of 3.7 and sodium of 135.   He had a postoperative anemia with a hemoglobin of 9.3.  He was started on  ferrous sulfate t.i.d.   He was well anticoagulated on Coumadin with an INR of 2.3 and will be  continued on Coumadin 6 mg a day.   He underwent an ultrasound of the legs which revealed no evidence of DVT;  however, the mid portion of the left superficial femoral vein could not be  well visualized due to his prominent edema.   His chest x-ray revealed no infiltrate.   He was improved and stable for discharge at a  weight of 319 on April 27, 2002.  He will follow up in the office in one week.  He will take no further  doses of indomethacin, Minoxidil, or clindamycin.   DISCHARGE MEDICATIONS:  1. Lasix 80 mg q.d.  2. Lotrel 5/10 two q.d.  3. Coumadin 6 mg q.d.  4. Potassium 20 mEq q.d.  5. Toprol-XL 50 mg q.d.  6.     Avalide 300/12.5 mg q.d.  7. Ferrous sulfate 325 mg t.i.d. with meals.  8. Tylox one q.4h. p.r.n.  Kingsley Callander. Ouida Sills, M.D.    ROF/MEDQ  D:  04/27/2002  T:  04/29/2002  Job:  [id number]

## 2010-12-23 NOTE — H&P (Signed)
NAME:  Matthew Kane, Matthew Kane NO.:  [id number]   MEDICAL RECORD NO.:  [id number]          PATIENT TYPE:  INP   LOCATION:  A210                          FACILITY:  [facility]   PHYSICIAN:  Skeet Latch, DO    DATE OF BIRTH:  Feb 06, 1951   DATE OF ADMISSION:  10/16/2006  DATE OF DISCHARGE:  LH                              HISTORY & PHYSICAL   PRIMARY CARE PHYSICIAN:  Dr. Erby Pian.   CHIEF COMPLAINT:  Chest pain.   HISTORY OF PRESENT ILLNESS:  This is a 60 year old African American male  who presents complaining of right-sided chest pain.  The patient states  he has been having this chest discomfort for approximately 2 weeks.  The  patient states the pain is worse on exertion, but denies any nausea,  vomiting, lightheadedness or sweating.  The patient states that the pain  does not radiate and stays on the right side of the chest and describes  it as an aching sensation.  The patient states that he noticed the pain  started approximately 2 weeks ago after trying to put on his socks.  The  patient states the pain has continued for approximately 2 weeks and he  contacted his PCP and the staff told the patient that he could not be  seen without any appointment.  The patient states that he did a lot of  moving in and out of his car today and states that the pain got severe  and decided to come to the emergency room for evaluation.  The patient  does state that he had a similar episode approximately 1 year ago and  went to [facility] and states that he believes he had a stress test that  was negative.   PAST MEDICAL HISTORY:  Positive for hypertension.   PAST SURGICAL HISTORY:  1. Cholecystectomy.  2. Bilateral hip replacements in 2001/2002.   SOCIAL HISTORY:  He denies any tobacco, alcohol or illicit drug use.   ALLERGIES:  PENICILLIN.   CURRENT MEDICATIONS:  1. Aspirin 81 mg daily.  2. Clonidine 0.2 mg b.i.d.  3. Lisinopril/hydrochlorothiazide 20/12.5 mg daily.   REVIEW OF SYSTEMS:  GENERAL:  Denies any weight change, fatigue,  weakness, fever or chills.  SKIN:  Denies any skin, hair or nail  changes, itching rash.  HEENT:  Denies any headache, hearing problems,  earaches, sneezing, stiffness, sore throat, swollen neck.  CARDIAC:  Positive for chest pain.  Denies any dyspnea or palpitation.  RESPIRATORY:  Denies any shortness of breath, cough or hemoptysis.  GI:  Denies any problems with appetite, nausea or vomiting, or abdominal  pain.  URINARY:  Denies any frequency, hesitancy, urgency, dysuria or  hematuria.  MUSCULOSKELETAL:  Denies any arthritic-type pain, except for  bilateral hips.  NEUROLOGIC:  Denies any numbness, tingling, tremors,  weakness or paralysis.   PHYSICAL EXAMINATION:  VITAL SIGNS:  Temperature is 98.7 degrees, pulse  68, respirations 20 and last blood pressure was 159/104.  GENERAL:  He appears stated age, in no acute distress.  The patient is  pleasant and cooperative.  HEENT:  Head is atraumatic,  normocephalic.  PERRLA.  EOMI.  NECK:  Supple.  No JVD and no thyromegaly appreciated.  HEART:  Regular rate and rhythm.  No rubs, gallops or murmurs noted.  LUNGS:  Clear to auscultation bilaterally.  No rales, rhonchi or  wheezing.  ABDOMEN:  Obese, soft, nontender and non-distended.  Positive bowel  sounds.  MUSCULOSKELETAL:  The patient does have some right chest wall discomfort  with movement of his right upper extremity.  NEUROLOGICAL:  He is alert and oriented x3.  Cranial nerves II-XII are  grossly intact bilaterally.   LABORATORY DATA:  White blood cell count is 11.1, hemoglobin 15.1,  hematocrit 46.4 and platelets are 230,000.  Sodium 135, potassium 3.0,  chloride 95, CO2 is 32, glucose 112, BUN 26, creatinine 2.37.  Calcium  9.1.  First troponin was less than 0.05; second troponin was 0.07.  Myoglobin:  First set 339, second set 217.   Chest x-ray shows cardiomegaly with low lung volumes, no airspace edema  or  focal consolidation.   ASSESSMENT:  1. Chest pain.  2. Hypertension.  3. Obesity.  4. Hypokalemia.   PLAN:  The patient will be admitted to telemetry unit with diagnosis of  chest pain.  The patient will be placed on cardiac II diet.  The patient  will be placed on bedrest for 24 hours and his diet will be advanced as  tolerated.  The patient will be placed on IV normal saline with 20 mEq  of potassium at 100 mL an hour with vital signs q.4 h.  First EKG was  abnormal and did show some T-wave changes in lateral leads; we will  repeat EKG in the a.m. and we will get cardiac enzymes q.6 h. x3.  We  will also get a Cardiology consult., repeat a.m. labs, CBC, CMP, BNP,  TSH and urine with C&S.  We will also add a lipid panel to the a.m.  labs.  The patient will be placed on aspirin daily.  The patient will  also be given a nitroglycerin patch q.6 h.  The patient will be given IV  pain medication as well as beta blocker.  The patient probably will need  a stress test in the near future; will defer to Cardiology, based on the  patient's second troponin being a little elevated.  At this time, the  patient is pain-free and is stable.  As mentioned, the patient will be  given oral as well as IV pain medications.  The patient will be placed  on GI as well as DVT prophylaxis.  The patient will be followed  carefully.      Skeet Latch, DO  Electronically Signed     SM/MEDQ  D:  10/17/2006  T:  10/18/2006  Job:  [id number]

## 2010-12-23 NOTE — Procedures (Signed)
NAME:  Matthew Kane, Matthew Kane NO.:  [id number]   MEDICAL RECORD NO.:  [id number]          PATIENT TYPE:  INP   LOCATION:  A210                          FACILITY:  [facility]   PHYSICIAN:  Noralyn Pick. Eden Emms, MD, FACCDATE OF BIRTH:  03/27/1951   DATE OF PROCEDURE:  10/17/2006  DATE OF DISCHARGE:                                ECHOCARDIOGRAM   INDICATIONS:  Sleep apnea, hypertension, chest pain.   Left ventricular cavity size was normal.  There was moderate LVH, septal  thickness was 18 mm with mild basal septal hypertrophy.  There were no  regional wall motion abnormalities.  EF was 60%.  There was mild left  atrial enlargement.  There was trivial MR with mild mitral valve  thickening.  Right-sided cardiac chambers were normal.  There was mild  TR.  Aortic valve was not well-visualized but appeared mildly sclerotic.  There is no stenosis.  Subcostal imaging revealed no atrial septal  defect, no source of embolus and no effusion.   M-mode measurements included the following:  Aortic root diameter 39 mm,  left atrial diameter 42 mm, septal thickness 18 mm, LV diastolic  dimension 46 mm, LV systolic dimension 36 mm.   FINAL IMPRESSION:  1. Moderate left ventricular hypertrophy, ejection fraction 60%, no      regional wall motion abnormality.  2. Mild left atrial enlargement.  3. Trivial mitral regurgitation.  4. Aortic valve sclerosis.  5. Normal right-sided cardiac chambers.  6. No pericardial effusion.      Noralyn Pick. Eden Emms, MD, [facility]  Electronically Signed     PCN/MEDQ  D:  10/17/2006  T:  10/18/2006  Job:  [id number]   cc:   Dr. Rito Ehrlich

## 2010-12-23 NOTE — H&P (Signed)
NAME:  Matthew Kane, Matthew Kane NO.:  [id number]   MEDICAL RECORD NO.:  [id number]                   PATIENT TYPE:  INP   LOCATION:  A319                                 FACILITY:  [facility]   PHYSICIAN:  Kingsley Callander. Ouida Sills, M.D.                  DATE OF BIRTH:  May 27, 1951   DATE OF ADMISSION:  04/24/2002  DATE OF DISCHARGE:                                HISTORY & PHYSICAL   CHIEF COMPLAINT:  Swelling.   HISTORY OF PRESENT ILLNESS:  This patient is a 60 year old, African-American  male who presented with massive swelling of the legs and arms.  He had  recently had a left hip replacement at [facility].  He has underlying  chronic renal failure and hypertension.  He has multiple possible  contributing factors to this swelling including recent use of indomethacin,  minoxidil, amlodipine and Lotrel.  His hypertension is now controlled.  His  weight is up approximately 20 pounds.  He is anticoagulated with Coumadin  and still has staples in place in the left hip.  He has had no definite  evidence of DVT, but he has had a great deal of swelling in the left leg.  He has tenderness associated with his edema.   PAST MEDICAL HISTORY:  1. Osteoarthritis, status post bilateral hip replacements with the last on     September 4.  Preoperative cardiac catheterization was normal. 2.     Hypertension.  2. Chronic renal failure with a creatinine of approximately 2.  3. Status post cholecystectomy.   MEDICATIONS:  1. Coumadin 6 mg q.d.  2. Indomethacin 75 mg q.d.  3. Potassium 20 mEq q.d.  4. Lasix 80 mg b.i.d. p.o.  5. Clindamycin 150 mg three q.i.d.  6. Avalide 300/12.5 q.d.  7. Lotrel 10/40 q.d.  8. Toprol XL 50 mg q.d.  9. Minoxidil 20 mg q.d.   ALLERGIES:  PENICILLIN.   FAMILY HISTORY:  Father died of lung cancer at age 32.  Mother died of heart  attack at age 44.   SOCIAL HISTORY:  He is employed.  He does not smoke cigarettes, use alcohol  or use recreational  drugs.   REVIEW OF SYMPTOMS:  RESPIRATORY:  No chest pain, fever or cough.  He was  thought to possibly have sleep apnea while at [facility].  GI:  No problems  with his bowel habits or urination.   PHYSICAL EXAMINATION:  VITAL SIGNS:  Temperature 99.3, pulse 87,  respirations 20, blood pressure 156/79.  GENERAL:  Alert, oriented, obese, African-American male.  HEENT:  Eyes and pharynx unremarkable.  No JVD or thyromegaly.  LUNGS:  Clear.  HEART:  Regular with no murmurs.  ABDOMEN:  Obese, nontender with no palpable organomegaly.  EXTREMITIES:  There is massive edema in the legs, feet, arms and hands.  He  has tenderness in the legs.  NEUROLOGIC:  Grossly intact.  He is walking with a walker.  SKIN:  His left hip is healing well.  Staples are still in place.   LABORATORY DATA AND X-RAY FINDINGS:  INR 2.3.  Sodium 137, potassium 3.6,  bicarb 31, glucose 113, BUN 13, creatinine 2.2.  SGOT 19, calcium 8.9,  albumin 3.3.  Hemoglobin 9.3, white count 7.7, platelets 286.   IMPRESSION:  1. Anasarca.  2. Severe hypertension.  3. Chronic renal insufficiency.  4. Postoperative anemia.   PLAN:  1. He is being hospitalized for further evaluation and to exclude possible     DVT following hip surgery.  He is satisfactorily anticoagulated at this     point.  He has several possible contributors to his swelling including     the recent addition of indomethacin at [facility].  He has been on     minoxidil and amlodipine for some time and has not experienced edema from     them.  His creatinine is minimally elevated from his preop level of 2.0.  2. Start iron.                                               Kingsley Callander. Ouida Sills, M.D.    ROF/MEDQ  D:  04/25/2002  T:  04/25/2002  Job:  [id number]

## 2010-12-29 ENCOUNTER — Ambulatory Visit: Payer: Medicare HMO | Admitting: Vascular Surgery

## 2011-01-11 ENCOUNTER — Encounter (HOSPITAL_COMMUNITY): Payer: Medicare HMO | Attending: Nephrology

## 2011-01-11 DIAGNOSIS — D509 Iron deficiency anemia, unspecified: Secondary | ICD-10-CM | POA: Insufficient documentation

## 2011-01-12 ENCOUNTER — Ambulatory Visit (INDEPENDENT_AMBULATORY_CARE_PROVIDER_SITE_OTHER): Payer: Medicare HMO | Admitting: Vascular Surgery

## 2011-01-12 ENCOUNTER — Encounter (INDEPENDENT_AMBULATORY_CARE_PROVIDER_SITE_OTHER): Payer: Medicare HMO

## 2011-01-12 DIAGNOSIS — N186 End stage renal disease: Secondary | ICD-10-CM

## 2011-01-12 DIAGNOSIS — T82598A Other mechanical complication of other cardiac and vascular devices and implants, initial encounter: Secondary | ICD-10-CM

## 2011-01-13 NOTE — Assessment & Plan Note (Signed)
OFFICE VISIT  Matthew Kane, BAIZ DOB:  03/24/1951                                       01/12/2011 EAVWU#:[id number]  The patient returns for followup today after placement of a right radiocephalic fistula in February of 2012.  He is currently not on dialysis.  He has no symptoms of steal and denies numbness, tingling or aching in his hand.  REVIEW OF SYSTEMS:  He has no shortness of breath, skin itching, nausea or vomiting.  PHYSICAL EXAM:  Blood pressure is 143/89 in the left arm, heart rate 69 and regular, respirations 18.  Right upper extremity has an easily palpable thrill and audible bruit.  In his radiocephalic fistula the fistula is palpable up to the mid forearm.  He had a duplex ultrasound today performed of the fistula which shows the diameter is now between 60 and 80 mm and the depth from the skin is less than 6 mm throughout its entire course.  There were a few side branches in the mid forearm but I do not believe that these are inhibiting development of the fistula.  At this point I believe the best option for the patient would be to follow up on an as-needed basis.  His fistula should be ready for use if required in the future.  If he has any further problems he will return for further followup.    Janetta Hora. Joanny Dupree, MD Electronically Signed  CEF/MEDQ  D:  01/13/2011  T:  01/13/2011  Job:  [id number]  cc:   Cecille Aver, M.D.

## 2011-01-18 ENCOUNTER — Encounter (HOSPITAL_COMMUNITY): Payer: Medicare HMO

## 2011-01-27 NOTE — Procedures (Unsigned)
VASCULAR LAB EXAM  INDICATION:  Follow-up of right radiocephalic Cimino fistula.  HISTORY: Diabetes:  No. Cardiac:  No. Hypertension:  Yes.  EXAM:  A right radiocephalic Cimino fistula.  IMPRESSION:  Patent (maturing) right radiocephalic Cimino fistula with multiple branches as illustrated on the attached diagram.  Increased velocities at the anastomoses snuffbox at 6.46 m/s over 4.18 m/s as well as increased velocities within the cephalic past the anastomoses at 6.04 m/s over 4.17 m/s.  ___________________________________________ Janetta Hora. Fields, MD  OD/MEDQ  D:  01/12/2011  T:  01/12/2011  Job:  [id number]

## 2011-02-14 ENCOUNTER — Encounter: Payer: Self-pay | Admitting: Cardiology

## 2011-04-10 ENCOUNTER — Emergency Department (HOSPITAL_COMMUNITY)
Admission: EM | Admit: 2011-04-10 | Discharge: 2011-04-10 | Disposition: A | Discharge status: Home / Self Care | Payer: Medicare HMO | Attending: Emergency Medicine | Admitting: Emergency Medicine

## 2011-04-10 ENCOUNTER — Emergency Department (HOSPITAL_COMMUNITY): Payer: Medicare HMO

## 2011-04-10 DIAGNOSIS — M79606 Pain in leg, unspecified: Secondary | ICD-10-CM

## 2011-04-10 DIAGNOSIS — M79609 Pain in unspecified limb: Secondary | ICD-10-CM | POA: Insufficient documentation

## 2011-04-10 DIAGNOSIS — I129 Hypertensive chronic kidney disease with stage 1 through stage 4 chronic kidney disease, or unspecified chronic kidney disease: Secondary | ICD-10-CM | POA: Insufficient documentation

## 2011-04-10 DIAGNOSIS — N189 Chronic kidney disease, unspecified: Secondary | ICD-10-CM | POA: Insufficient documentation

## 2011-04-10 HISTORY — DX: Essential (primary) hypertension: I10

## 2011-04-10 LAB — BASIC METABOLIC PANEL
BUN: 73 mg/dL — ABNORMAL HIGH (ref 6–23)
Creatinine, Ser: 12.29 mg/dL — ABNORMAL HIGH (ref 0.50–1.35)
GFR calc Af Amer: 5 mL/min — ABNORMAL LOW (ref >60)
GFR calc non Af Amer: 4 mL/min — ABNORMAL LOW (ref >60)
Potassium: 4.2 mEq/L (ref 3.5–5.1)

## 2011-04-10 MED ORDER — ENOXAPARIN SODIUM 80 MG/0.8ML ~~LOC~~ SOLN
SUBCUTANEOUS | Status: AC
  Filled 2011-04-10: qty 1.6

## 2011-04-10 MED ORDER — ENOXAPARIN SODIUM 120 MG/0.8ML ~~LOC~~ SOLN
1.0000 mg/kg | Freq: Once | SUBCUTANEOUS | Status: AC
  Administered 2011-04-10: 111.1 mg via SUBCUTANEOUS
  Filled 2011-04-10: qty 0.8

## 2011-04-10 MED ORDER — HYDROCODONE-ACETAMINOPHEN 5-325 MG PO TABS: 1.0000 | ORAL_TABLET | ORAL | Status: AC | PRN

## 2011-04-10 NOTE — ED Notes (Signed)
Pain to right calf area that stared Saturday, pt states that he had a cramp in calf area sat night  And has continued pain since, pt admits to swelling to back of calf area. Cms intact.

## 2011-04-10 NOTE — ED Notes (Signed)
Pt reports waking Saturday a.m with pain to the back of his right leg.  Pt reports pain to the area ever since.  Pt denies any swelling or heat to the area.

## 2011-04-11 ENCOUNTER — Other Ambulatory Visit (HOSPITAL_COMMUNITY): Payer: Self-pay | Admitting: Emergency Medicine

## 2011-04-11 ENCOUNTER — Ambulatory Visit (HOSPITAL_COMMUNITY)
Admit: 2011-04-11 | Discharge: 2011-04-11 | Disposition: A | Discharge status: Home / Self Care | Payer: Medicare HMO | Source: Ambulatory Visit | Attending: Emergency Medicine | Admitting: Emergency Medicine

## 2011-04-11 DIAGNOSIS — M7989 Other specified soft tissue disorders: Secondary | ICD-10-CM | POA: Insufficient documentation

## 2011-04-11 NOTE — ED Provider Notes (Signed)
History     CSN: [id number] Arrival date & time: 04/10/2011 12:22 PM  Chief Complaint  Patient presents with  . Leg Pain   Patient is a 60 y.o. male presenting with leg pain. The history is provided by the patient.  Leg Pain  The incident occurred 2 days ago. The incident occurred at home. There was no injury mechanism (He reports waking with a cramp in his left lower leg 2 nights ago,  stood up to walk it off,  and since has had increased pain and a tight feeling in his calf muscle.). The pain is present in the left leg. The quality of the pain is described as aching. The pain is at a severity of 4/10. The pain is moderate. The pain has been constant since onset. Pertinent negatives include no numbness, no inability to bear weight, no loss of motion, no muscle weakness and no loss of sensation. The symptoms are aggravated by activity. He has tried nothing for the symptoms.    Past Medical History  Diagnosis Date  . Hypertension   . Renal disorder     Past Surgical History  Procedure Date  . Hip surgery     No family history on file.  History  Substance Use Topics  . Smoking status: Never Smoker   . Smokeless tobacco: Not on file  . Alcohol Use: No      Review of Systems  Constitutional: Negative for fever.  HENT: Negative for congestion, sore throat and neck pain.   Eyes: Negative.   Respiratory: Negative for chest tightness and shortness of breath.   Cardiovascular: Negative for chest pain.  Gastrointestinal: Negative for nausea and abdominal pain.  Genitourinary: Negative.   Musculoskeletal: Positive for myalgias. Negative for back pain, joint swelling and arthralgias.  Skin: Negative.  Negative for rash and wound.  Neurological: Negative for dizziness, weakness, light-headedness, numbness and headaches.  Hematological: Negative.   Psychiatric/Behavioral: Negative.     Physical Exam  BP 146/92  Pulse 66  Temp(Src) 98.6 F (37 C) (Oral)  Resp 20  Ht 6' (1.829  m)  Wt 245 lb (111.131 kg)  BMI 33.23 kg/m2  SpO2 97%  Physical Exam  Nursing note and vitals reviewed. Constitutional: He is oriented to person, place, and time. He appears well-developed and well-nourished.  HENT:  Head: Normocephalic and atraumatic.  Eyes: Conjunctivae are normal.  Neck: Normal range of motion.  Cardiovascular: Normal rate, regular rhythm, normal heart sounds and intact distal pulses.   Pulmonary/Chest: Effort normal and breath sounds normal. He has no wheezes.  Abdominal: Soft. Bowel sounds are normal. There is no tenderness.  Musculoskeletal: Normal range of motion. He exhibits tenderness. He exhibits no edema.       Left lower leg: He exhibits tenderness. He exhibits no swelling, no edema and no deformity.       ttp left upper calf,  No palpable cords.  Negative homans sign.  Neurological: He is alert and oriented to person, place, and time.  Skin: Skin is warm and dry.  Psychiatric: He has a normal mood and affect.    ED Course  Procedures  MDM Discussed case with Dr. Estell Harpin who did agree with getting doppler US.  Scheduled for am.  Patient was treated with IM lovenox prior to dispo home today.  Discussed lab results with patient, specifically his elevated creatinine level.  He states Dr. Kathrene Bongo is following him for chronic renal failure and in fact has a AV graft in place  in anticipation of dialysis soon.  He is scheduled to see her in the morning.  Stressed with pt that he needs to let her know his creatinine level today.  Patient understands,  Also states he feels fine, with no weakness,  Nausea,  Vomiting,  Which are the signs he was told to watch for by renal.  Potassium stable today.   Patient returned to ed for Venous doppler which was negative.  Still with complaint of pain.  Referral to Dr Romeo Apple given.  Tramadol #20 prescribed.  Pt also states he did see Dr. Kathrene Bongo this am and is being scheduled for his first dialysis next  week.      Candis Musa, PA 04/16/11 1542

## 2011-04-18 NOTE — ED Provider Notes (Addendum)
Medical screening examination/treatment/procedure(s) were conducted as a shared visit with non-physician practitioner(s) and myself.  I personally evaluated the patient during the encounter   Benny Lennert, MD 04/18/11 1036         pe tender left upper calf  Benny Lennert, MD 04/21/11 [phone]

## 2011-05-09 ENCOUNTER — Ambulatory Visit (HOSPITAL_COMMUNITY)
Admission: RE | Admit: 2011-05-09 | Discharge: 2011-05-09 | Disposition: A | Discharge status: Home / Self Care | Payer: Medicare HMO | Source: Ambulatory Visit | Attending: Surgery | Admitting: Surgery

## 2011-05-09 DIAGNOSIS — T82898A Other specified complication of vascular prosthetic devices, implants and grafts, initial encounter: Secondary | ICD-10-CM

## 2011-05-09 DIAGNOSIS — Y849 Medical procedure, unspecified as the cause of abnormal reaction of the patient, or of later complication, without mention of misadventure at the time of the procedure: Secondary | ICD-10-CM | POA: Insufficient documentation

## 2011-05-09 DIAGNOSIS — N186 End stage renal disease: Secondary | ICD-10-CM | POA: Insufficient documentation

## 2011-05-09 DIAGNOSIS — I120 Hypertensive chronic kidney disease with stage 5 chronic kidney disease or end stage renal disease: Secondary | ICD-10-CM | POA: Insufficient documentation

## 2011-05-09 DIAGNOSIS — Z992 Dependence on renal dialysis: Secondary | ICD-10-CM | POA: Insufficient documentation

## 2011-05-09 DIAGNOSIS — I708 Atherosclerosis of other arteries: Secondary | ICD-10-CM

## 2011-05-09 LAB — POCT I-STAT, CHEM 8
Creatinine, Ser: 12.6 mg/dL — ABNORMAL HIGH (ref 0.50–1.35)
Glucose, Bld: 88 mg/dL (ref 70–99)
Hemoglobin: 9.2 g/dL — ABNORMAL LOW (ref 13.0–17.0)
TCO2: 23 mmol/L (ref 0–100)

## 2011-05-10 ENCOUNTER — Other Ambulatory Visit (HOSPITAL_COMMUNITY): Payer: Self-pay | Admitting: Nephrology

## 2011-05-10 DIAGNOSIS — N186 End stage renal disease: Secondary | ICD-10-CM

## 2011-05-11 ENCOUNTER — Encounter (HOSPITAL_COMMUNITY): Payer: Self-pay | Admitting: *Deleted

## 2011-05-11 ENCOUNTER — Emergency Department (HOSPITAL_COMMUNITY): Payer: Medicare HMO

## 2011-05-11 ENCOUNTER — Ambulatory Visit (HOSPITAL_COMMUNITY)
Admission: RE | Admit: 2011-05-11 | Discharge: 2011-05-11 | Disposition: A | Discharge status: Home / Self Care | Payer: Medicare HMO | Source: Ambulatory Visit | Attending: Nephrology | Admitting: Nephrology

## 2011-05-11 ENCOUNTER — Emergency Department (HOSPITAL_COMMUNITY)
Admission: EM | Admit: 2011-05-11 | Discharge: 2011-05-11 | Disposition: A | Discharge status: Home / Self Care | Payer: Medicare HMO | Source: Home / Self Care | Attending: Emergency Medicine | Admitting: Emergency Medicine

## 2011-05-11 ENCOUNTER — Other Ambulatory Visit (HOSPITAL_COMMUNITY): Payer: Self-pay | Admitting: Nephrology

## 2011-05-11 DIAGNOSIS — I120 Hypertensive chronic kidney disease with stage 5 chronic kidney disease or end stage renal disease: Secondary | ICD-10-CM | POA: Insufficient documentation

## 2011-05-11 DIAGNOSIS — N186 End stage renal disease: Secondary | ICD-10-CM

## 2011-05-11 DIAGNOSIS — Y832 Surgical operation with anastomosis, bypass or graft as the cause of abnormal reaction of the patient, or of later complication, without mention of misadventure at the time of the procedure: Secondary | ICD-10-CM | POA: Insufficient documentation

## 2011-05-11 DIAGNOSIS — M109 Gout, unspecified: Secondary | ICD-10-CM | POA: Insufficient documentation

## 2011-05-11 DIAGNOSIS — Z992 Dependence on renal dialysis: Secondary | ICD-10-CM | POA: Insufficient documentation

## 2011-05-11 DIAGNOSIS — T82898A Other specified complication of vascular prosthetic devices, implants and grafts, initial encounter: Secondary | ICD-10-CM | POA: Insufficient documentation

## 2011-05-11 DIAGNOSIS — T8243XA Leakage of vascular dialysis catheter, initial encounter: Secondary | ICD-10-CM

## 2011-05-11 LAB — CBC
MCH: 26.1 pg (ref 26.0–34.0)
MCHC: 32.9 g/dL (ref 30.0–36.0)
Platelets: 214 10*3/uL (ref 150–400)

## 2011-05-11 LAB — BASIC METABOLIC PANEL
BUN: 53 mg/dL — ABNORMAL HIGH (ref 6–23)
Chloride: 99 mEq/L (ref 96–112)
Creatinine, Ser: 12.81 mg/dL — ABNORMAL HIGH (ref 0.50–1.35)
GFR calc Af Amer: 4 mL/min — ABNORMAL LOW (ref >90)
GFR calc non Af Amer: 4 mL/min — ABNORMAL LOW (ref >90)

## 2011-05-11 LAB — PROTIME-INR: Prothrombin Time: 13.7 seconds (ref 11.6–15.2)

## 2011-05-11 NOTE — ED Notes (Signed)
Pt reports he had a new port-a-cath inserted at [facility] earlier today, pt was advised to have site checked if he noticed any leaking, pt reports he has noticed some light red leaking around site, pt denies any pain

## 2011-05-11 NOTE — ED Notes (Signed)
Pt left the er stating no needs 

## 2011-05-11 NOTE — ED Provider Notes (Signed)
History    Scribed for No att. providers found, the patient was seen in room APAH1/APAH1. This chart was scribed by Katha Cabal. This patient's care was started at 19:55.     CSN: [id number] Arrival date & time: 05/11/2011  7:43 PM  Chief Complaint  Patient presents with  . Wound Check    (Consider location/radiation/quality/duration/timing/severity/associated sxs/prior treatment) HPI  Matthew Kane is a 60 y.o. male who presents to the Emergency Department complaining of new port-a-cath bleeding that began suddenly today.  Patient had Dialysis catheter placed today on right side of his chest. Bleeding stopped while in ED.  Denies chest pain, SOB, abdominal pain and urinary problems. Patient has been on dialysis for last 3.5 weeks.      PAST MEDICAL HISTORY:  Past Medical History  Diagnosis Date  . Hypertension   . Renal disorder     PAST SURGICAL HISTORY:  Past Surgical History  Procedure Date  . Hip surgery   . Portacath placement     FAMILY HISTORY:  No family history on file.   SOCIAL HISTORY: History   Social History  . Marital Status: Divorced    Spouse Name: N/A    Number of Children: N/A  . Years of Education: N/A   Social History Main Topics  . Smoking status: Never Smoker   . Smokeless tobacco: None  . Alcohol Use: No  . Drug Use: No  . Sexually Active:    Other Topics Concern  . None   Social History Narrative  . None    Review of Systems 10 Systems reviewed and are negative for acute change except as noted in the HPI.  Allergies  Penicillins  Home Medications   Current Outpatient Rx  Name Route Sig Dispense Refill  . AMLODIPINE BESYLATE 10 MG PO TABS Oral Take 10 mg by mouth daily.      . ASPIRIN EC 81 MG PO TBEC Oral Take 162 mg by mouth daily.      Marland Kitchen CALCITRIOL 0.5 MCG PO CAPS Oral Take 0.5 mcg by mouth daily.      Marland Kitchen CLONIDINE HCL 0.3 MG PO TABS Oral Take 0.3 mg by mouth 2 (two) times daily.      Marland Kitchen METOPROLOL TARTRATE 50 MG  PO TABS Oral Take 50 mg by mouth 2 (two) times daily.      Marland Kitchen OMEPRAZOLE 40 MG PO CPDR Oral Take 40 mg by mouth daily.      Marland Kitchen POTASSIUM CHLORIDE 10 MEQ PO TBCR Oral Take 10 mEq by mouth daily.      . ALLOPURINOL 100 MG PO TABS Oral Take 100 mg by mouth daily.        BP 174/95  Pulse 87  Temp(Src) 98.8 F (37.1 C) (Oral)  Ht 6' (1.829 m)  Wt 250 lb (113.399 kg)  BMI 33.91 kg/m2  SpO2 94%  Physical Exam  Nursing note and vitals reviewed. Constitutional: He is oriented to person, place, and time. He appears well-developed and well-nourished. No distress.  HENT:  Head: Normocephalic and atraumatic.  Eyes: Conjunctivae and EOM are normal.  Neck: Neck supple.  Cardiovascular: Normal rate and regular rhythm.   No murmur heard. Pulmonary/Chest: Effort normal. No respiratory distress.       Right sided chest dialysis catheter is dry clean and intact   Abdominal: Soft. There is no tenderness. There is no rebound and no guarding.  Musculoskeletal: Normal range of motion. He exhibits no edema.  Right forearm fistula with good bruit and thrill  Neurological: He is alert and oriented to person, place, and time. No sensory deficit.  Skin: Skin is warm and dry. No rash noted.  Psychiatric: He has a normal mood and affect. His behavior is normal.    ED Course  Procedures (including critical care time)  Labs Reviewed - No data to display OTHER DATA REVIEWED: Nursing notes, vital signs, and past medical records reviewed.   DIAGNOSTIC STUDIES: Oxygen Saturation is 94% on room air, normal by my interpretation.       LABS / RADIOLOGY:  Results for orders placed during the hospital encounter of 05/11/11  CBC      Component Value Range   WBC 10.9 (*) 4.0 - 10.5 (K/uL)   RBC 3.56 (*) 4.22 - 5.81 (MIL/uL)   Hemoglobin 9.3 (*) 13.0 - 17.0 (g/dL)   HCT 98.1 (*) 19.1 - 52.0 (%)   MCV 79.5  78.0 - 100.0 (fL)   MCH 26.1  26.0 - 34.0 (pg)   MCHC 32.9  30.0 - 36.0 (g/dL)   RDW 47.8   29.5 - 62.1 (%)   Platelets 214  150 - 400 (K/uL)  PROTIME-INR      Component Value Range   Prothrombin Time 13.7  11.6 - 15.2 (seconds)   INR 1.03  0.00 - 1.49   BASIC METABOLIC PANEL      Component Value Range   Sodium 139  135 - 145 (mEq/L)   Potassium 4.0  3.5 - 5.1 (mEq/L)   Chloride 99  96 - 112 (mEq/L)   CO2 25  19 - 32 (mEq/L)   Glucose, Bld 112 (*) 70 - 99 (mg/dL)   BUN 53 (*) 6 - 23 (mg/dL)   Creatinine, Ser 30.86 (*) 0.50 - 1.35 (mg/dL)   Calcium 9.2  8.4 - 57.8 (mg/dL)   GFR calc non Af Amer 4 (*) >90 (mL/min)   GFR calc Af Amer 4 (*) >90 (mL/min)   Dg Chest 2 View  05/11/2011  *RADIOLOGY REPORT*  Clinical Data: Dialysis catheter placed earlier today, now with leak  CHEST - 2 VIEW  Comparison: 09/12/2010; fluoroscopic guided dialysis catheter placement - earlier same day  Findings:  Unchanged borderline enlarged cardiac silhouette and mild tortuosity of the descending thoracic aorta.  Unchanged positioning of right internal jugular approach dialysis catheter with tips overlying the superior aspect of the right atrium.  No focal parenchymal opacities.  There is persistent blunting of the bilateral costophrenic angles without definite pleural effusion. No pneumothorax.  Unchanged bones.  IMPRESSION: 1.  Unchanged positioning of the right jugular approach dialysis catheter with tips overlying the expected location of the superior aspect of the right atrium. 2.  No acute cardiopulmonary disease.  Original Report Authenticated By: Waynard Reeds, M.D.   Ir Fluoro Guide Cv Line Right  05/11/2011  *RADIOLOGY REPORT*  Clinical Data/Indication: RENAL FAILURE  TUNNELED DIALYSIS CATHETER PLACEMENT, ULTRASOUND GUIDANCE FOR VASCULAR ACCESS  Sedation: Versed 2 mg, Fentanyl 100 mcg.  Total Moderate Sedation Time: 15 minutes.  Fluoroscopy Time: 0.6 minutes.  Procedure: The procedure, risks, benefits, and alternatives were explained to the patient. Questions regarding the procedure were encouraged  and answered. The patient understands and consents to the procedure.  The right neck was prepped with betadine in a sterile fashion, and a sterile drape was applied covering the operative field. A sterile gown and sterile gloves were used for the procedure. 1% lidocaine into the skin and subcutaneous  tissue.  The right internal jugular vein was noted to be patent initially with ultrasound.  Under sonographic guidance, a micropuncture needle was inserted into the right IJ vein (Ultrasound and fluoroscopic image documentation was performed). It was removed over an 018 wire which was upsized to an Amplatz.  This was advanced into the IVC.  A small incision was made in the right upper chest.  The tunneling device was utilized to advance the 23 centimeter tip to cuff catheter from the chest incision and out the neck incision.  A peel- away sheath was advanced over the Amplatz wire.  The leading edge of the catheter was then advanced through the peel-away sheath. The peel-away sheath was removed.  It was flushed and instilled with heparin.  The chest incision was closed with a 0 Prolene pursestring stitch.  The neck incision was closed with a 4-0 Vicryl subcuticular stitch.  No complications.  FINDINGS: The image demonstrates placement of a tunneled dialysis catheter with its tip in the right atrium.  IMPRESSION: Successful right IJ vein tunneled dialysis catheter with its tip in the right atrium.  Original Report Authenticated By: Donavan Burnet, M.D.   Ir US Guide Vasc Access Right  05/11/2011  *RADIOLOGY REPORT*  Clinical Data/Indication: RENAL FAILURE  TUNNELED DIALYSIS CATHETER PLACEMENT, ULTRASOUND GUIDANCE FOR VASCULAR ACCESS  Sedation: Versed 2 mg, Fentanyl 100 mcg.  Total Moderate Sedation Time: 15 minutes.  Fluoroscopy Time: 0.6 minutes.  Procedure: The procedure, risks, benefits, and alternatives were explained to the patient. Questions regarding the procedure were encouraged and answered. The patient  understands and consents to the procedure.  The right neck was prepped with betadine in a sterile fashion, and a sterile drape was applied covering the operative field. A sterile gown and sterile gloves were used for the procedure. 1% lidocaine into the skin and subcutaneous tissue.  The right internal jugular vein was noted to be patent initially with ultrasound.  Under sonographic guidance, a micropuncture needle was inserted into the right IJ vein (Ultrasound and fluoroscopic image documentation was performed). It was removed over an 018 wire which was upsized to an Amplatz.  This was advanced into the IVC.  A small incision was made in the right upper chest.  The tunneling device was utilized to advance the 23 centimeter tip to cuff catheter from the chest incision and out the neck incision.  A peel- away sheath was advanced over the Amplatz wire.  The leading edge of the catheter was then advanced through the peel-away sheath. The peel-away sheath was removed.  It was flushed and instilled with heparin.  The chest incision was closed with a 0 Prolene pursestring stitch.  The neck incision was closed with a 4-0 Vicryl subcuticular stitch.  No complications.  FINDINGS: The image demonstrates placement of a tunneled dialysis catheter with its tip in the right atrium.  IMPRESSION: Successful right IJ vein tunneled dialysis catheter with its tip in the right atrium.  Original Report Authenticated By: Donavan Burnet, M.D.     ED COURSE / COORDINATION OF CARE:  Orders Placed This Encounter  Procedures  . DG Chest 2 View         MDM   MDM: RIJ dialysis catheter placed today.  Patient concerned because he saw some oozing of blood.  Dressing changed prior to my evaluation.  Catheter site clean, dry, intact.  NO active bleeding.   IMPRESSION: Diagnoses that have been ruled out:  Diagnoses that are still under consideration:  Final diagnoses:  Leakage of vascular dialysis catheter      MEDICATIONS GIVEN IN THE E.D. Scheduled Meds:   Continuous Infusions:      DISCHARGE MEDICATIONS: New Prescriptions   No medications on file    I personally performed the services described in this documentation, which was scribed in my presence.  The recorded information has been reviewed and considered.         Glynn Octave, MD 05/12/11 1155

## 2011-05-11 NOTE — ED Notes (Signed)
Pt had a port a cath inserted today and the incision was oozing with evidence of dried blood. Dressing removed and replaced using sterile technique, no noted bleeding pt to follow up with the nurse in the morning for assessment of site

## 2011-05-12 ENCOUNTER — Emergency Department (HOSPITAL_COMMUNITY)
Admission: EM | Admit: 2011-05-12 | Discharge: 2011-05-12 | Discharge status: Against Medical Advice | Payer: Medicare HMO | Attending: Emergency Medicine | Admitting: Emergency Medicine

## 2011-05-12 ENCOUNTER — Encounter (HOSPITAL_COMMUNITY): Payer: Self-pay | Admitting: *Deleted

## 2011-05-12 ENCOUNTER — Emergency Department (HOSPITAL_COMMUNITY)
Admission: EM | Admit: 2011-05-12 | Discharge: 2011-05-13 | Disposition: A | Discharge status: Home / Self Care | Payer: Medicare HMO | Attending: Emergency Medicine | Admitting: Emergency Medicine

## 2011-05-12 ENCOUNTER — Ambulatory Visit (HOSPITAL_COMMUNITY)
Admission: RE | Admit: 2011-05-12 | Discharge: 2011-05-12 | Disposition: A | Discharge status: Home / Self Care | Payer: Medicare HMO | Source: Ambulatory Visit | Attending: Nephrology | Admitting: Nephrology

## 2011-05-12 ENCOUNTER — Other Ambulatory Visit (HOSPITAL_COMMUNITY): Payer: Self-pay | Admitting: Nephrology

## 2011-05-12 DIAGNOSIS — M7989 Other specified soft tissue disorders: Secondary | ICD-10-CM | POA: Insufficient documentation

## 2011-05-12 DIAGNOSIS — IMO0001 Reserved for inherently not codable concepts without codable children: Secondary | ICD-10-CM | POA: Insufficient documentation

## 2011-05-12 DIAGNOSIS — T82898A Other specified complication of vascular prosthetic devices, implants and grafts, initial encounter: Secondary | ICD-10-CM | POA: Insufficient documentation

## 2011-05-12 DIAGNOSIS — Z0389 Encounter for observation for other suspected diseases and conditions ruled out: Secondary | ICD-10-CM | POA: Insufficient documentation

## 2011-05-12 DIAGNOSIS — Z992 Dependence on renal dialysis: Secondary | ICD-10-CM | POA: Insufficient documentation

## 2011-05-12 DIAGNOSIS — Y849 Medical procedure, unspecified as the cause of abnormal reaction of the patient, or of later complication, without mention of misadventure at the time of the procedure: Secondary | ICD-10-CM | POA: Insufficient documentation

## 2011-05-12 DIAGNOSIS — Z79899 Other long term (current) drug therapy: Secondary | ICD-10-CM | POA: Insufficient documentation

## 2011-05-12 DIAGNOSIS — R609 Edema, unspecified: Secondary | ICD-10-CM

## 2011-05-12 DIAGNOSIS — N186 End stage renal disease: Secondary | ICD-10-CM | POA: Insufficient documentation

## 2011-05-12 DIAGNOSIS — I742 Embolism and thrombosis of arteries of the upper extremities: Secondary | ICD-10-CM | POA: Insufficient documentation

## 2011-05-12 NOTE — ED Notes (Signed)
Pt informed of the consult to vascular surgery.

## 2011-05-12 NOTE — ED Notes (Signed)
Right arm and hand

## 2011-05-12 NOTE — ED Provider Notes (Signed)
History     CSN: [id number] Arrival date & time: 05/12/2011  9:30 PM  Chief Complaint  Patient presents with  . Arm Swelling    (Consider location/radiation/quality/duration/timing/severity/associated sxs/prior treatment) HPI Comments: Patient c/o pain and swelling of his right right arm since yesterday.  Patient had a dialysis catheter placed in right chest on 05/11/11 and presented to ED later that evening with bleeding around the port-a cath.  He had his dialysis treatment today and noticed swelling to his arm.  He was sent to have an ultrasound of the arm and instructed to go to [facility] for further evaluation.  Patient states that he went to the ED there and he became tired of waiting and decided to come by to [facility].  He denies numbness, weakness or discoloration of the arm, shortness of breath, chest pain or fever.   Patient is a 60 y.o. male presenting with extremity pain. The history is provided by the patient.  Extremity Pain This is a new problem. The current episode started yesterday. The problem occurs constantly. The problem has been gradually worsening. Associated symptoms include myalgias. Pertinent negatives include no abdominal pain, arthralgias, chest pain, chills, coughing, diaphoresis, fatigue, fever, headaches, nausea, neck pain, numbness, rash, sore throat, swollen glands, vomiting or weakness. Exacerbated by: movement, palpation. He has tried nothing for the symptoms. The treatment provided no relief.    Past Medical History  Diagnosis Date  . Hypertension   . Renal disorder     Past Surgical History  Procedure Date  . Hip surgery   . Portacath placement     History reviewed. No pertinent family history.  History  Substance Use Topics  . Smoking status: Never Smoker   . Smokeless tobacco: Not on file  . Alcohol Use: No      Review of Systems  Constitutional: Negative for fever, chills, diaphoresis and fatigue.  HENT: Negative for sore throat,  facial swelling, trouble swallowing, neck pain and neck stiffness.   Respiratory: Negative for cough, chest tightness, shortness of breath and wheezing.   Cardiovascular: Negative for chest pain and palpitations.  Gastrointestinal: Negative for nausea, vomiting, abdominal pain and blood in stool.  Genitourinary: Negative for dysuria, hematuria and flank pain.  Musculoskeletal: Positive for myalgias. Negative for back pain, arthralgias and gait problem.  Skin: Negative for color change, pallor, rash and wound.  Neurological: Negative for dizziness, weakness, numbness and headaches.  Hematological: Does not bruise/bleed easily.  Psychiatric/Behavioral: Negative for confusion and decreased concentration.  All other systems reviewed and are negative.    Allergies  Penicillins  Home Medications   Current Outpatient Rx  Name Route Sig Dispense Refill  . AMLODIPINE BESYLATE 10 MG PO TABS Oral Take 10 mg by mouth daily.      . ASPIRIN EC 81 MG PO TBEC Oral Take 162 mg by mouth daily.      Marland Kitchen CALCITRIOL 0.5 MCG PO CAPS Oral Take 0.5 mcg by mouth daily.      Marland Kitchen CLONIDINE HCL 0.3 MG PO TABS Oral Take 0.3 mg by mouth 2 (two) times daily.      Marland Kitchen METOPROLOL TARTRATE 50 MG PO TABS Oral Take 50 mg by mouth 2 (two) times daily.      Marland Kitchen OMEPRAZOLE 40 MG PO CPDR Oral Take 40 mg by mouth daily.      Marland Kitchen POTASSIUM CHLORIDE 10 MEQ PO TBCR Oral Take 10 mEq by mouth daily.      . ALLOPURINOL 100 MG PO  TABS Oral Take 100 mg by mouth daily.        BP 181/109  Pulse 84  Temp(Src) 99 F (37.2 C) (Oral)  Resp 20  Ht 6' (1.829 m)  Wt 250 lb (113.399 kg)  BMI 33.91 kg/m2  SpO2 98%  Physical Exam  Nursing note and vitals reviewed. Constitutional: He is oriented to person, place, and time. He appears well-developed and well-nourished. No distress.  HENT:  Head: Normocephalic and atraumatic.  Mouth/Throat: Oropharynx is clear and moist.  Neck: Normal range of motion. Neck supple. No JVD present. No  thyromegaly present.  Cardiovascular: Normal rate, regular rhythm and normal heart sounds.   Pulmonary/Chest: Effort normal and breath sounds normal. No respiratory distress. He exhibits no tenderness.       Patient has a dialysis catheter to right chest.  Dressing in place.  Appears clean and dry  Abdominal: Soft. He exhibits no distension. There is no tenderness.  Musculoskeletal: Normal range of motion. He exhibits edema. He exhibits no tenderness.       Moderate edema of the right arm that extends from the fingers to the mid upper arm.  Fistula of the right forearm with a good bruit and thrill.  No discoloration of the arm.  Pt has full ROM of the arm and fingers.  Distal sensation is intact.  CR 2 sec.  Lymphadenopathy:    He has no cervical adenopathy.  Neurological: He is alert and oriented to person, place, and time. No cranial nerve deficit. He exhibits normal muscle tone. Coordination normal.  Skin: Skin is warm and dry.    ED Course  Procedures (including critical care time)  Labs Reviewed - No data to display Dg Chest 2 View  05/11/2011  *RADIOLOGY REPORT*  Clinical Data: Dialysis catheter placed earlier today, now with leak  CHEST - 2 VIEW  Comparison: 09/12/2010; fluoroscopic guided dialysis catheter placement - earlier same day  Findings:  Unchanged borderline enlarged cardiac silhouette and mild tortuosity of the descending thoracic aorta.  Unchanged positioning of right internal jugular approach dialysis catheter with tips overlying the superior aspect of the right atrium.  No focal parenchymal opacities.  There is persistent blunting of the bilateral costophrenic angles without definite pleural effusion. No pneumothorax.  Unchanged bones.  IMPRESSION: 1.  Unchanged positioning of the right jugular approach dialysis catheter with tips overlying the expected location of the superior aspect of the right atrium. 2.  No acute cardiopulmonary disease.  Original Report Authenticated By:  Waynard Reeds, M.D.   US Venous Img Upper Uni Right  05/12/2011  *RADIOLOGY REPORT*  Clinical Data: End-stage renal disease on hemodialysis, hypertension, swelling right upper extremity, failed dialysis fistula right distal arm, right IJ catheter placement on 05/11/2011  RIGHT UPPER EXTREMITY VENOUS ULTRASOUND  Comparison: None  Findings: Right internal jugular, subclavian, axillary, basilic, brachial, radial, and ulnar veins appear patent and compressible. Spontaneous venous flow is present within these venous segments. No intraluminal thrombus identified within these segments.  A palpable thrill is identified in the right forearm along the radial border, likely the outflow from dialysis fistula originating at the right wrist.  I suspect this is a radial artery fistula anastomosis at the radial border of the wrist.  At this site, at the junction of the fistula with the right radial artery, high velocity arterial flow is seen with significant turbulence on color Doppler imaging and spectral broadening on waveform analysis.  Peak systolic velocity is 733 cm/sec consistent with a  hemodynamically significant anastomotic stricture.  Additionally, at the mid forearm portion of the venous outflow from the fistula, a segment of intraluminal thrombus is identified.  Observed superficial thrombus is nonocclusive, with a small amount of residual spontaneous flow and compressibility present.  IMPRESSION: No evidence of deep venous thrombosis in the upper right arm. AV graft at right wrist with evidence of stricture at the arterial anastomoses and a segment of superficial thrombus within the venous outflow from the right forearm fistula.  Findings called to the referring physician's office by the technologist prior to dictation of this report; patient instructed to present to the [facility] Emergency Department for further evaluation.  Original Report Authenticated By: Lollie Marrow, M.D.   Ir Fluoro Guide Cv  Line Right  05/11/2011  *RADIOLOGY REPORT*  Clinical Data/Indication: RENAL FAILURE  TUNNELED DIALYSIS CATHETER PLACEMENT, ULTRASOUND GUIDANCE FOR VASCULAR ACCESS  Sedation: Versed 2 mg, Fentanyl 100 mcg.  Total Moderate Sedation Time: 15 minutes.  Fluoroscopy Time: 0.6 minutes.  Procedure: The procedure, risks, benefits, and alternatives were explained to the patient. Questions regarding the procedure were encouraged and answered. The patient understands and consents to the procedure.  The right neck was prepped with betadine in a sterile fashion, and a sterile drape was applied covering the operative field. A sterile gown and sterile gloves were used for the procedure. 1% lidocaine into the skin and subcutaneous tissue.  The right internal jugular vein was noted to be patent initially with ultrasound.  Under sonographic guidance, a micropuncture needle was inserted into the right IJ vein (Ultrasound and fluoroscopic image documentation was performed). It was removed over an 018 wire which was upsized to an Amplatz.  This was advanced into the IVC.  A small incision was made in the right upper chest.  The tunneling device was utilized to advance the 23 centimeter tip to cuff catheter from the chest incision and out the neck incision.  A peel- away sheath was advanced over the Amplatz wire.  The leading edge of the catheter was then advanced through the peel-away sheath. The peel-away sheath was removed.  It was flushed and instilled with heparin.  The chest incision was closed with a 0 Prolene pursestring stitch.  The neck incision was closed with a 4-0 Vicryl subcuticular stitch.  No complications.  FINDINGS: The image demonstrates placement of a tunneled dialysis catheter with its tip in the right atrium.  IMPRESSION: Successful right IJ vein tunneled dialysis catheter with its tip in the right atrium.  Original Report Authenticated By: Donavan Burnet, M.D.   Ir US Guide Vasc Access Right  05/11/2011   *RADIOLOGY REPORT*  Clinical Data/Indication: RENAL FAILURE  TUNNELED DIALYSIS CATHETER PLACEMENT, ULTRASOUND GUIDANCE FOR VASCULAR ACCESS  Sedation: Versed 2 mg, Fentanyl 100 mcg.  Total Moderate Sedation Time: 15 minutes.  Fluoroscopy Time: 0.6 minutes.  Procedure: The procedure, risks, benefits, and alternatives were explained to the patient. Questions regarding the procedure were encouraged and answered. The patient understands and consents to the procedure.  The right neck was prepped with betadine in a sterile fashion, and a sterile drape was applied covering the operative field. A sterile gown and sterile gloves were used for the procedure. 1% lidocaine into the skin and subcutaneous tissue.  The right internal jugular vein was noted to be patent initially with ultrasound.  Under sonographic guidance, a micropuncture needle was inserted into the right IJ vein (Ultrasound and fluoroscopic image documentation was performed). It was removed over an 018  wire which was upsized to an Amplatz.  This was advanced into the IVC.  A small incision was made in the right upper chest.  The tunneling device was utilized to advance the 23 centimeter tip to cuff catheter from the chest incision and out the neck incision.  A peel- away sheath was advanced over the Amplatz wire.  The leading edge of the catheter was then advanced through the peel-away sheath. The peel-away sheath was removed.  It was flushed and instilled with heparin.  The chest incision was closed with a 0 Prolene pursestring stitch.  The neck incision was closed with a 4-0 Vicryl subcuticular stitch.  No complications.  FINDINGS: The image demonstrates placement of a tunneled dialysis catheter with its tip in the right atrium.  IMPRESSION: Successful right IJ vein tunneled dialysis catheter with its tip in the right atrium.  Original Report Authenticated By: Donavan Burnet, M.D.        MDM    I have reviewed patient's vitals , nursing notes and  previous medical records.  Patient also seen by EDP.  I will consult vascular surgery  11:34 PM Consulted Dr. Durwin Nora, vascular surgeon. Discussed [place] report, per Dr. Durwin Nora,  patient will need to have non-emergent fistulagram.  He recommended pt to elevate his arm and call his office on Monday for appt.    I have advised patient of my consult and Dr. Darletta Moll instructions.  Patient verbalized understanding and agrees to care plan.         Waleska Buttery L. Demaris Bousquet, [place] 05/13/11 1635

## 2011-05-12 NOTE — ED Notes (Signed)
MD at bedside. 

## 2011-05-12 NOTE — ED Notes (Signed)
Pt reports increased swelling to the right arm. Distal more swelling noted, skin taunt. Increased pain stated. Was seen here & mc today.

## 2011-05-12 NOTE — ED Notes (Signed)
Pt had a portacath placed yesterday. Pt states he now has swelling to arm and hand. Pulses present.

## 2011-05-12 NOTE — ED Notes (Signed)
Pt c/o swelling in right arm.

## 2011-05-13 NOTE — ED Provider Notes (Signed)
ED PROVIDER NOTE:  Medical screening examination/treatment/procedure(s) were conducted as a shared visit with non-physician practitioner(s) and myself.  I personally evaluated the patient during the encounter  PATIENT SEEN BY ME AND PLAN WAS TO DW VASCULAR SURGERY WHICH WAS DONE. [place] STUDY FROM TODAY REVIEWED WITH THEM AND PHYSICAL FINDINGS OKAY TO GO HOME THEY WILL FOLLOW UP IN OFFICE. MIDLEVEL SPOKE WITH DR Durwin Nora.   Shelda Jakes, MD 05/13/11 0005

## 2011-05-15 ENCOUNTER — Other Ambulatory Visit: Payer: Medicare HMO

## 2011-05-16 ENCOUNTER — Ambulatory Visit (HOSPITAL_COMMUNITY)
Admission: RE | Admit: 2011-05-16 | Discharge: 2011-05-16 | Disposition: A | Discharge status: Home / Self Care | Payer: Medicare HMO | Source: Ambulatory Visit | Attending: Vascular Surgery | Admitting: Vascular Surgery

## 2011-05-16 ENCOUNTER — Ambulatory Visit (INDEPENDENT_AMBULATORY_CARE_PROVIDER_SITE_OTHER): Payer: Medicare HMO | Admitting: Vascular Surgery

## 2011-05-16 DIAGNOSIS — Y832 Surgical operation with anastomosis, bypass or graft as the cause of abnormal reaction of the patient, or of later complication, without mention of misadventure at the time of the procedure: Secondary | ICD-10-CM

## 2011-05-16 DIAGNOSIS — T82898A Other specified complication of vascular prosthetic devices, implants and grafts, initial encounter: Secondary | ICD-10-CM | POA: Insufficient documentation

## 2011-05-16 DIAGNOSIS — N186 End stage renal disease: Secondary | ICD-10-CM | POA: Insufficient documentation

## 2011-05-16 DIAGNOSIS — I871 Compression of vein: Secondary | ICD-10-CM

## 2011-05-16 DIAGNOSIS — Z48812 Encounter for surgical aftercare following surgery on the circulatory system: Secondary | ICD-10-CM

## 2011-05-16 DIAGNOSIS — M7989 Other specified soft tissue disorders: Secondary | ICD-10-CM | POA: Insufficient documentation

## 2011-05-16 DIAGNOSIS — T82598A Other mechanical complication of other cardiac and vascular devices and implants, initial encounter: Secondary | ICD-10-CM

## 2011-05-16 DIAGNOSIS — I708 Atherosclerosis of other arteries: Secondary | ICD-10-CM

## 2011-05-16 LAB — POCT I-STAT, CHEM 8
BUN: 47 mg/dL — ABNORMAL HIGH (ref 6–23)
Hemoglobin: 9.9 g/dL — ABNORMAL LOW (ref 13.0–17.0)
Sodium: 134 mEq/L — ABNORMAL LOW (ref 135–145)
TCO2: 21 mmol/L (ref 0–100)

## 2011-05-16 LAB — POCT ACTIVATED CLOTTING TIME: Activated Clotting Time: 133 seconds

## 2011-05-17 LAB — POCT ACTIVATED CLOTTING TIME: Activated Clotting Time: 166 seconds

## 2011-05-18 NOTE — Procedures (Unsigned)
VASCULAR LAB EXAM  INDICATION:  Complication of right arterial venous fistula.  HISTORY: Diabetes:  No. Cardiac:  No. Hypertension:  Yes.  EXAM:  Right radiocephalic arteriovenous fistula duplex.  IMPRESSION: 1. Elevated velocities at the anastomosis and venous outflow at the     wrist level, which may suggest stenosis. 2. No evidence of thrombus identified in the right venous outflow or     deep venous system to the level of the subclavian vein. 3. Depth and diameters are as noted on worksheet. 4. 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  ___________________________________________ Janetta Hora. Fields, MD  SH/MEDQ  D:  05/16/2011  T:  05/16/2011  Job:  [id number]

## 2011-05-19 ENCOUNTER — Telehealth: Payer: Self-pay

## 2011-05-19 NOTE — Telephone Encounter (Signed)
Phonecall from pt. w/ c/o swelling in right arm.  Pt. is s/p right arm fistulogram w/angioplasty of (R) cephalic vn. and angioplasty of brachial/axillary vn. of 10/9.  States arm is very tight involving hand and arm.  States that there is a burning in the puncture site from procedure.  Also states there is a blister at site of tape.  Questioned about reddness or inflammation.  States that the elbow area is somewhat inflammed.  Denies any numbness or tingling of hand.  Pt. States swelling has been there for about 3 wks.  States not really sure if swelling has gotten much worse since procedure.  Discussed w/ Dr. Imogene Burn.  Advised that pt. Go to ER if sx's worsen over weekend, and to bring in on 10/15 for evaluation.  Appt given to pt. For 10/15 @ 9:15 AM.  Advised to go to ER if swelling worsens, numbness in fingers occurs, increase in pain, fever/chills occur.  Pt. Verb. Understanding.

## 2011-05-22 ENCOUNTER — Encounter: Payer: Self-pay | Admitting: Surgery

## 2011-05-22 ENCOUNTER — Ambulatory Visit (INDEPENDENT_AMBULATORY_CARE_PROVIDER_SITE_OTHER): Payer: Medicare HMO | Admitting: Surgery

## 2011-05-22 VITALS — BP 165/109 | HR 66 | Temp 97.9°F | Ht 72.0 in | Wt 240.0 lb

## 2011-05-22 DIAGNOSIS — T82898A Other specified complication of vascular prosthetic devices, implants and grafts, initial encounter: Secondary | ICD-10-CM

## 2011-05-22 DIAGNOSIS — N186 End stage renal disease: Secondary | ICD-10-CM

## 2011-05-22 NOTE — Progress Notes (Signed)
[facility] [place]   Patient name: Matthew Kane MRN: [id number] DOB: 1950-12-31 Sex: male     Chief Complaint  Patient presents with  . Follow-up    Continued burning in LUE after fistulogram on 05/16/11    ( Pt has HD on TUESDAY, THURSDAY and SATURDAY via Catheter)    HISTORY OF PRESENT ILLNESS: The patient is back today for followup. He is status post right radiocephalic fistula by Dr. Darrick Penna in February of 2012. Since that time he is initiated renal replacement therapy. Approximately 3 weeks ago he began having difficulty with his access as well as swelling in the arm. He is noted to have had some infiltration with access. He was scheduled for a fistulogram this was done on October 2 at that time I found an outflow stenosis at the brachials axillary junction which was treated with balloon venoplasty up to a 14 x 4 Atlas balloo . He had a good result with this. Unfortunately he developed worsening swelling in his arm radiology was asked to put in a permanent dialysis catheter this was done on the right side, the arm of the swelling. And he came back for a repeat fistulogram on October 9 the entire venous system does appear to be patent I did intervene on the proximal cephalic vein as well as the brachial axillary junction again the patient was very sensitive to the balloons going in and out of the inside of his pain he did not tolerate this very well despite the sedation he was left with a satisfactory result his back in today the office stating that he still had a significant swelling in his right arm interestingly the swelling is from his antecubital crease distally Past Medical History  Diagnosis Date  . Hypertension   . Renal disorder   . OSA (obstructive sleep apnea)   . Obesity   . Gout     Past Surgical History  Procedure Date  . Hip surgery   . Portacath placement   . Av fistula placement     History   Social History  . Marital Status: Divorced   Spouse Name: N/A    Number of Children: N/A  . Years of Education: N/A   Occupational History  . Not on file.   Social History Main Topics  . Smoking status: Never Smoker   . Smokeless tobacco: Not on file  . Alcohol Use: No  . Drug Use: No  . Sexually Active:    Other Topics Concern  . Not on file   Social History Narrative  . No narrative on file    No family history on file.  Allergies as of 05/22/2011 - Review Complete 05/22/2011  Allergen Reaction Noted  . Penicillins  10/29/2006    Current Outpatient Prescriptions on File Prior to Visit  Medication Sig Dispense Refill  . allopurinol (ZYLOPRIM) 100 MG tablet Take 100 mg by mouth daily.        Marland Kitchen amLODipine (NORVASC) 10 MG tablet Take 10 mg by mouth daily.        Marland Kitchen aspirin EC 81 MG tablet Take 162 mg by mouth daily.        . calcitRIOL (ROCALTROL) 0.5 MCG capsule Take 0.5 mcg by mouth daily.        . cloNIDine (CATAPRES) 0.3 MG tablet Take 0.3 mg by mouth 2 (two) times daily.        . metoprolol (LOPRESSOR) 50 MG tablet Take 50 mg by mouth  2 (two) times daily.        Marland Kitchen omeprazole (PRILOSEC) 40 MG capsule Take 40 mg by mouth daily.        . potassium chloride (KLOR-CON) 10 MEQ CR tablet Take 10 mEq by mouth daily.           REVIEW OF SYSTEMS: No chest pain no shortness of breath positive for right arm swelling limited use of his right arm PHYSICAL EXAMINATION: General: The patient appears their stated age.  Vital signs are BP 165/109  Pulse 66  Temp(Src) 97.9 F (36.6 C) (Oral)  Ht 6' (1.829 m)  Wt 240 lb (108.863 kg)  BMI 32.55 kg/m2 Pulmonary: Respirations are nonlabored Neurologic: No focal weakness or paresthesias are detected, Skin: There are no ulcer or rashes noted. Psychiatric: The patient has normal affect. Cardiovascular: There is a regular rate and rhythm without significant murmur appreciated. Palpable thrill over the right forearm fistula the right arm is very edematous from the antecubital crease  distally   Diagnostic Studies I reviewed his previous fistulogram Assessment: End-stage renal disease with swollen right arm status post AV fistula Plan: By fistulogram the patient has a wide lid and the patent venous outflow tract with mild narrowing at the proximal cephalic vein as well as the brachial axillary junction. Interestingly his swelling began to the antecubital crease and goes down towards his fingers he is very minimal swelling in his upper arm this raises the possibility that the swelling could be due to prior access site issues versus possibly infection regardless the patient has a fistula that is not being used at this time in addition his arm is so swollen that he does not have function of his hand. This is his right hand and he is right-hand dominant. I feel that most likely her most prudent course of action is going to be to ligate his right arm fistula and place a fistula in his left cephalic vein. I have reviewed his vein mapping from the first part of this year and he does have an adequate cephalic vein measuring 0.24 2.41 cousin is somewhat complicated picture of discussed this case with Dr. Hart Rochester inserted and the pictures and he is in agreement that with a poorly frosting fistula as well as lifestyle altering edema the most prudent course would be ligation  I'm going to place the patient in an Ace wrap to see if we can't get some improvement with the swelling have also encouraged and keep his arm elevated if he is no better in one week's time he is to be placed on the operating room schedule for ligation of his right radiocephalic fistula and creation of a new left arm fistula either at the wrist with antecubital crease   V. Charlena Cross, M.D. [facility] [place] Office: [phone]

## 2011-05-23 ENCOUNTER — Encounter: Payer: Self-pay | Admitting: Vascular Surgery

## 2011-05-24 LAB — BASIC METABOLIC PANEL
BUN: 21
Creatinine, Ser: 2.09 — ABNORMAL HIGH
GFR calc non Af Amer: 33 — ABNORMAL LOW
Potassium: 3 — ABNORMAL LOW

## 2011-05-24 LAB — CBC
Platelets: 219
WBC: 15 — ABNORMAL HIGH

## 2011-05-24 LAB — DIFFERENTIAL
Lymphocytes Relative: 11 — ABNORMAL LOW
Lymphs Abs: 1.6
Neutrophils Relative %: 80 — ABNORMAL HIGH

## 2011-05-25 LAB — COMPREHENSIVE METABOLIC PANEL
AST: 28
BUN: 19
CO2: 30
Chloride: 100
Creatinine, Ser: 2.22 — ABNORMAL HIGH
GFR calc non Af Amer: 31 — ABNORMAL LOW
Total Bilirubin: 0.4

## 2011-05-25 LAB — DIFFERENTIAL
Basophils Absolute: 0.1
Basophils Relative: 1
Eosinophils Relative: 3
Lymphocytes Relative: 19
Neutro Abs: 8 — ABNORMAL HIGH

## 2011-05-25 LAB — CBC
HCT: 41.7
MCV: 77.8 — ABNORMAL LOW
RBC: 5.35
WBC: 11.2 — ABNORMAL HIGH

## 2011-05-25 LAB — B-NATRIURETIC PEPTIDE (CONVERTED LAB): Pro B Natriuretic peptide (BNP): <30

## 2011-05-25 LAB — BLOOD GAS, ARTERIAL
Acid-Base Excess: 6.2 — ABNORMAL HIGH
FIO2: 21
Patient temperature: 37
pCO2 arterial: 51.9 — ABNORMAL HIGH

## 2011-05-31 ENCOUNTER — Ambulatory Visit (HOSPITAL_COMMUNITY)
Admission: RE | Admit: 2011-05-31 | Discharge: 2011-05-31 | Disposition: A | Discharge status: Home / Self Care | Payer: Medicare HMO | Source: Ambulatory Visit | Attending: Vascular Surgery | Admitting: Vascular Surgery

## 2011-05-31 DIAGNOSIS — G4733 Obstructive sleep apnea (adult) (pediatric): Secondary | ICD-10-CM | POA: Insufficient documentation

## 2011-05-31 DIAGNOSIS — E669 Obesity, unspecified: Secondary | ICD-10-CM | POA: Insufficient documentation

## 2011-05-31 DIAGNOSIS — I120 Hypertensive chronic kidney disease with stage 5 chronic kidney disease or end stage renal disease: Secondary | ICD-10-CM | POA: Insufficient documentation

## 2011-05-31 DIAGNOSIS — N186 End stage renal disease: Secondary | ICD-10-CM | POA: Insufficient documentation

## 2011-05-31 DIAGNOSIS — Z992 Dependence on renal dialysis: Secondary | ICD-10-CM | POA: Insufficient documentation

## 2011-05-31 DIAGNOSIS — T82898A Other specified complication of vascular prosthetic devices, implants and grafts, initial encounter: Secondary | ICD-10-CM

## 2011-05-31 DIAGNOSIS — K219 Gastro-esophageal reflux disease without esophagitis: Secondary | ICD-10-CM | POA: Insufficient documentation

## 2011-05-31 HISTORY — PX: AV FISTULA PLACEMENT: SHX1204

## 2011-05-31 LAB — POCT I-STAT 4, (NA,K, GLUC, HGB,HCT)
HCT: 33 % — ABNORMAL LOW (ref 39.0–52.0)
Hemoglobin: 11.2 g/dL — ABNORMAL LOW (ref 13.0–17.0)

## 2011-05-31 LAB — SURGICAL PCR SCREEN
MRSA, PCR: NEGATIVE
Staphylococcus aureus: NEGATIVE

## 2011-06-06 NOTE — Op Note (Signed)
NAME:  Matthew Kane, Matthew Kane NO.:  [id number]  MEDICAL RECORD NO.:  [id number]  LOCATION:  [facility]                         FACILITY:  [facility]  PHYSICIAN:  Juleen China IV, MDDATE OF BIRTH:  [birth year]  DATE OF PROCEDURE:  05/16/2011 DATE OF DISCHARGE:  05/16/2011                              OPERATIVE REPORT   PREOPERATIVE DIAGNOSIS:  Right arm swelling.  POSTOPERATIVE DIAGNOSIS:  Right arm swelling.  PROCEDURE PERFORMED: 1. Ultrasound access, right arm fistula. 2. Right arm fistulogram. 3. Angioplasty, right cephalic vein. 4. Angioplasty of brachial/axillary vein. 5. Followup x2.  INDICATION:  Mr. Polio comes in having recently undergone a fistulogram.  He has developed severe swelling in his right arm.  PROCEDURE:  The patient was identified in the holding area, taken to room #8, placed supine on the table.  The right arm was prepped and draped in usual fashion.  Time-out was called.  The right arm fistula was evaluated by ultrasound and found to be widely patent, easily compressible.  Right arm fistula was then accessed under ultrasound guidance with a micropuncture needle an 0.018 wire was advanced without resistance into the fistula and a micropuncture sheath was placed. Contrast injections were performed through the sheath.  FINDINGS:  Again visualized the widely patent cephalic vein and the forearm.  It branches just distal to the antecubital crease and to the cephalic vein branching into the deep system.  Both veins in the upper arm appeared to be widely patent.  There does appear to be luminal irregularity within the cephalic vein as it enters the deep system.  The previously treated brachial axillary junction was patent but that does appear to be narrowed.  No prominent collaterals were visualized.  Because of the patient's symptoms following most recent intervention, I felt it was pressing to proceed.  Over a Versacore wire, a 7-French short  sheath was placed.  The patient was given heparin.  I first navigated a Versacore wire into the cephalic vein in the upper arm and into the deep system.  I initially performed balloon angioplasty of the proximal cephalic vein using a 5 x 4 balloon taken to nominal pressure. Followup study revealed suboptimal results.  I therefore elected to upsize to a 7 x 4 balloon.  Again angioplasty was performed in this area.  There did appear to be improvement in blood flow across this area.  Next I felt it was necessary to treat the brachial axillary junction since that was the area that had been treated last time.  The Versacore wire was withdrawn and then renavigated into the brachial vein and into the central venous system.  I initially performed angioplasty at the brachial axillary junction with a 10 x 4 Dorado balloon.  This was taken to 6 atmospheres where the patient experienced significant distress.  There was a tight waist on the balloon at this area.  I then performed a followup study which revealed improved but suboptimal results.  I therefore elected to upsize to a 12 x 4 balloon.  A 12 x 4 balloon was then inflated to nominal pressure in the area of concern at the brachial axillary junction.  A completion study was performed.  At this time, I felt like there was improved flow across this area.  At this point in time, decision made to terminate the procedure. Catheters and wires were removed.  The patient will have sheath removed if his coagulation profile is acceptable.  IMPRESSION: 1. Successful angioplasty of the proximal cephalic vein using a 7 x 4     balloon. 2. Successful angioplasty of the brachial axillary junction using a 12     x 4 balloon. 3. Should the patient have continued swelling in his arm,     consideration must be given to stenting of the brachial axillary     junction.  I was reluctant to do that today because that could     jeopardize his fistula and future  upper arm access on the right     side.     Jorge Ny, MD     VWB/MEDQ  D:  05/16/2011  T:  05/17/2011  Job:  [id number]  Electronically Signed by Arelia Longest IV MD on 06/06/2011 09:46:07 PM

## 2011-06-06 NOTE — Op Note (Signed)
NAME:  Matthew Kane, Matthew Kane NO.:  [id number]  MEDICAL RECORD NO.:  [id number]  LOCATION:  [facility]                         FACILITY:  [facility]  PHYSICIAN:  Juleen China IV, MDDATE OF BIRTH:  May 24, 1951  DATE OF PROCEDURE:  05/09/2011 DATE OF DISCHARGE:  05/09/2011                              OPERATIVE REPORT   PREOPERATIVE DIAGNOSIS:  Poorly-functioning right arm arteriovenous fistula.  POSTOPERATIVE DIAGNOSIS:  Poorly-functioning right arm arteriovenous fistula.  PROCEDURES PERFORMED: 1. Ultrasound access, right upper arm arteriovenous fistula. 2. Fistulogram. 3. Angioplasty, right subclavian vein x1.  INDICATIONS:  This is a 60 year old gentleman with end-stage renal disease who dialyzed through a right arm fistula.  He has been having trouble with dialysis.  He comes in today for fistulogram.  PROCEDURE IN DETAIL:  The patient was identified in the holding room and taken to room 8, and placed supine on the table.  The right arm was prepped and draped in the usual fashion.  Time-out was called. Ultrasound was used to evaluate the cephalic vein, and appeared to be widely patent.  A digital ultrasound image was acquired.  Right arm cephalic vein fistula was then accessed under ultrasound guidance with a micropuncture needle.  An 0.18 wire was then advanced into the fistula without resistance.  A micropuncture sheath was placed.  Contrast injections were then performed through the sheath.  FINDINGS:  The cephalic vein fistula is widely patent.  The cephalic vein near the arterial venous anastomosis is poorly visualized; however, it does appear to be a smaller caliber than the rest of the cephalic vein.  This could potentially represent the spasm to all proximal branches.  The remaining portion of the cephalic vein in the forearm is patent.  Just before the cephalic vein reaches the antecubital crease, the main branches as well as to the cephalic and deep  system.  No significant stenosis within the elbow or cephalic vein joints.  The subclavian vein, there does appear to be luminal narrowing here though no pronounced collaterals.  After the above images were obtained, decision was made to intervene over a Bentson wire, exchanged out for a 7-French sheath.  I then placed a Versacore wire through the deep venous system and to the right atrium. I then performed balloon angioplasty of the axillary subclavian vein junction.  This was done with  an 8 x 4 Mustang balloon taken to 20 atmospheres and  held up for 1 minute.  Completion arteriogram revealed no significant changes, therefore I selected a 10 x 4 Dorado balloon. The balloon was taken to 16 atmospheres and held up for 45 seconds. There was modest improvement in the luminal narrowing and the subclavian vein.  Due to the patient's discomfort, I elected not to continue on with the procedure.  I do feel there is mild improvement in the blood flow across the subclavian vein.  At this point, wires and balloons were removed and sheath was removed and manual pressure was used for hemostasis.  IMPRESSION: 1. Luminal narrowing at the axillary subclavian vein junction of     questionable significance.  This was  angioplastied using an 8 and     10-mm balloon with modest  improvement. 2. The arteriovenous anastomosis was poorly visualized due to the     patient's discomfort. 3. Fistula management:  If the patient continues to have problems with     dialysis, he will brought in for a duplex of the fistula.  He does     have multiple branches that may require ligation.     Jorge Ny, MD     VWB/MEDQ  D:  05/09/2011  T:  05/10/2011  Job:  [id number]  Electronically Signed by Arelia Longest IV MD on 06/06/2011 09:46:02 PM

## 2011-06-06 NOTE — Op Note (Signed)
NAME:  Matthew Kane, Matthew Kane NO.:  [id number]  MEDICAL RECORD NO.:  [id number]  LOCATION:  [facility]                         FACILITY:  [facility]  PHYSICIAN:  Janetta Hora. Ikeya Brockel, MD  DATE OF BIRTH:  10-Oct-1950  DATE OF PROCEDURE:  05/31/2011 DATE OF DISCHARGE:                              OPERATIVE REPORT   PROCEDURE:: 1. Left radiocephalic arteriovenous fistula. 2. Ligation of right radiocephalic arteriovenous fistula.  PREOPERATIVE DIAGNOSIS:  End-stage renal disease.  POSTOPERATIVE DIAGNOSIS:  End-stage renal disease.  ANESTHESIA:  Local with IV sedation.  ASSISTING:  Pecola Leisure, PA  FINDINGS: 1. A 3 mm left cephalic vein. 2. Right radiocephalic fistula ligated.  OPERATIVE DETAILS:  After obtaining informed consent, the patient was taken to the operating room.  The patient was placed in supine position on the operative table.  After adequate sedation, the patient's entire left upper extremity was prepped and draped in the usual sterile fashion.  Local anesthesia was infiltrated near the left wrist.  This was infiltrated midway between the cephalic vein and radial artery. Longitudinal incisions were made in this location, carried down through subcutaneous tissues down to the level of the left cephalic vein. Cephalic vein was dissected free circumferentially and small side branches were ligated between ties or clips.  Next, the radial artery was dissected free in the medial portion incision.  The patient was then given 5000 units of intravenous heparin.  Distal cephalic vein was ligated with 2-0 silk tie.  The vein was transected and swung over the level of the artery and gently distended with heparinized saline.  It was then marked for orientation.  Vessel loops were used to control the artery proximally and distally.  Longitudinal opening was made in the radial artery and the vein was sewn end to vein to side of artery using running 7-0 Prolene suture.  Just  prior to completion, anastomosis, was fore-bled, back bled, and thoroughly flushed.  Anastomosis was secured. Vessel loops were released.  There was palpable thrill in the fistula immediately.  Hemostasis was obtained.  Subcutaneous tissues were reapproximated using running 3-0 Vicryl suture.  Skin was closed with a 4-0 Vicryl subcuticular stitch.  Dermabond was applied.  Attention was then turned to the patient's right upper extremity.  The patient had a preexisting right radiocephalic AV fistula but was having symptoms of venous hypertension.  Patient's entire right upper extremity was prepped and draped in usual sterile fashion.  Local anesthesia was infiltrated at a preexisting longitudinal scar near the right wrist. Incision was carried down through the subcutaneous tissues down to the level of the AV fistula.  The fistula was dissected free circumferentially just above the level of the arterial anastomosis. This had an easily palpable thrill within it.  The fistula was approximately 4.5 to 5 mm in diameter.  After dissecting this free circumferentially, 2-0 silk tie was placed around this and the fistula was ligated.  There was no further palpable thrill.  Doppler was used to evaluate the radial and ulnar arteries, and there was still good flow in both of these.  Hemostasis was obtained with direct pressure. Subcutaneous tissues reapproximated using running 3-0 Vicryl suture. Skin was closed with  4-0 Vicryl subcuticular stitch.  Dermabond was applied.  The patient tolerated procedure well.  There were no complications.  Instrument, sponge, needle counts correct at the end of the case.  The patient was taken to the recovery room in stable condition.     Janetta Hora. Korry Dalgleish, MD     CEF/MEDQ  D:  05/31/2011  T:  05/31/2011  Job:  [id number]  Electronically Signed by Fabienne Bruns MD on 06/06/2011 01:00:45 PM

## 2011-06-21 ENCOUNTER — Ambulatory Visit (INDEPENDENT_AMBULATORY_CARE_PROVIDER_SITE_OTHER): Payer: Medicare HMO | Admitting: Urgent Care

## 2011-06-21 ENCOUNTER — Encounter: Payer: Self-pay | Admitting: Urgent Care

## 2011-06-21 VITALS — BP 150/100 | HR 76 | Temp 98.1°F | Ht 72.0 in | Wt 232.6 lb

## 2011-06-21 DIAGNOSIS — K219 Gastro-esophageal reflux disease without esophagitis: Secondary | ICD-10-CM

## 2011-06-21 DIAGNOSIS — Z1211 Encounter for screening for malignant neoplasm of colon: Secondary | ICD-10-CM

## 2011-06-21 NOTE — Assessment & Plan Note (Signed)
Matthew Kane is a 60 y.o. male with history of chronic kidney disease. He is undergoing workup for kidney transplant. He needs a screening colonoscopy. He denies any GI symptoms at this time except for rare diarrhea and heartburn.  I have discussed risks & benefits which include, but are not limited to, bleeding, infection, perforation & drug reaction.  The patient agrees with this plan & written consent will be obtained.

## 2011-06-21 NOTE — Progress Notes (Signed)
Cc to PCP 

## 2011-06-21 NOTE — Assessment & Plan Note (Signed)
Rare symptoms. When necessary PPI.

## 2011-06-21 NOTE — Patient Instructions (Signed)
Colonoscopy with Dr. Darrick Penna as planned.

## 2011-06-21 NOTE — Progress Notes (Signed)
Primary Care Physician:  Evlyn Courier, MD Primary Gastroenterologist:  Dr. Darrick Penna Nephrologist: Dr. Elvis Coil  Chief Complaint  Patient presents with  . Colonoscopy    HPI:  Matthew Kane is a 60 y.o. male here as a new patient to set up a screening colonoscopy. He has history of chronic kidney disease and is on dialysis. He is going to [facility] for preop testing for renal transplant. He was told he needed to have a screening colonoscopy. He occasionally has loose stools 3-4 times times per week, nothing more than 2-3 stools per day.  Denies abd pain.  History of chronic GERD on prilosec 20mg  prn.  Tells me since she's been on dialysis he has had very little GERD symptoms. He has had a weight loss of 30# intentionally since going on dialysis.   Denies any nausea, vomiting, dysphagia, odynophagia or anorexia.   Denies any lower GI symptoms including constipation, rectal bleeding, melena or weight loss.  Past Medical History  Diagnosis Date  . Hypertension   . CKD (chronic kidney disease)     on dialysis T/TH/Sat   . OSA (obstructive sleep apnea)   . Obesity   . Gout   . Leg cramps   . GERD (gastroesophageal reflux disease)   . ED (erectile dysfunction)   . Prostatic hypertrophy, benign   . Sleep apnea   . Allergic rhinitis    Past Surgical History  Procedure Date  . Hip surgery     left  . Portacath placement   . Av fistula placement   . Cholecystectomy   . Finger amputation     right middle   Current Outpatient Prescriptions  Medication Sig Dispense Refill  . allopurinol (ZYLOPRIM) 100 MG tablet Take 100 mg by mouth daily.        Marland Kitchen amLODipine (NORVASC) 10 MG tablet Take 10 mg by mouth daily.        Marland Kitchen aspirin EC 81 MG tablet Take 162 mg by mouth daily.        . calcitRIOL (ROCALTROL) 0.5 MCG capsule Take 0.5 mcg by mouth daily.        . cloNIDine (CATAPRES) 0.3 MG tablet Take 0.3 mg by mouth 2 (two) times daily.        Marland Kitchen omeprazole (PRILOSEC) 40  MG capsule Take 40 mg by mouth daily.        Marland Kitchen triamterene-hydrochlorothiazide (DYAZIDE) 37.5-25 MG per capsule Take 1 capsule by mouth every morning.        . multivitamin (RENA-VIT) TABS tablet Take 1 capsule by mouth Daily.      . potassium chloride (KLOR-CON) 10 MEQ CR tablet Take 10 mEq by mouth daily.         Allergies as of 06/21/2011 - Review Complete 06/21/2011  Allergen Reaction Noted  . Penicillins  10/29/2006   Family History:There is no known family history of colorectal carcinoma , liver disease, or inflammatory bowel disease.  Problem Relation Age of Onset  . Lung cancer Father 75  . Liver cancer Father 46  . Heart attack Mother 53  . Cancer Brother     unk   History   Social History  . Marital Status: Divorced    Spouse Name: N/A    Number of Children: 3  . Years of Education: N/A   Occupational History  . disabled    Social History Main Topics  . Smoking status: Never Smoker   . Smokeless tobacco: Not on file  .  Alcohol Use: No  . Drug Use: No  . Sexually Active: Not on file   Other Topics Concern  . Not on file   Social History Narrative   Lives alone   Review of Systems: Gen: Denies any fever, chills, sweats, anorexia, fatigue, weakness, malaise, weight loss, and sleep disorder CV: Denies chest pain, angina, palpitations, syncope, orthopnea, PND, peripheral edema, and claudication. Resp: Denies dyspnea at rest, dyspnea with exercise, cough, sputum, wheezing, coughing up blood, and pleurisy. GI: Denies vomiting blood, jaundice, and fecal incontinence.   Denies dysphagia or odynophagia. GU : Denies urinary burning, blood in urine, urinary frequency, urinary hesitancy, nocturnal urination, and urinary incontinence. MS: Denies joint pain, limitation of movement, and swelling, stiffness, low back pain, extremity pain. Denies muscle weakness, cramps, atrophy.  Derm: Denies rash, itching, dry skin, hives, moles, warts, or unhealing ulcers.  Psych: Denies  depression, anxiety, memory loss, suicidal ideation, hallucinations, paranoia, and confusion. Heme: Denies bruising, bleeding, and enlarged lymph nodes.  Physical Exam: BP 150/100  Pulse 76  Temp(Src) 98.1 F (36.7 C) (Temporal)  Ht 6' (1.829 m)  Wt 232 lb 9.6 oz (105.507 kg)  BMI 31.55 kg/m2 General:   Alert,  Well-developed, well-nourished, pleasant and cooperative in NAD Head:  Normocephalic and atraumatic. Eyes:  Sclera clear, no icterus.   Conjunctiva pink. Ears:  Normal auditory acuity. Nose:  No deformity, discharge,  or lesions. Mouth:  No deformity or lesions,oropharynx pink & moist. Neck:  Supple; no masses or thyromegaly. Lungs:  Clear throughout to auscultation.   Right upper anterior chest with AV fistula and dressing intact. No wheezes, crackles, or rhonchi. No acute distress. Heart:  Regular rate and rhythm; no murmurs, clicks, rubs,  or gallops. Abdomen:  Soft, nontender and nondistended. No masses, hepatosplenomegaly or hernias noted. Normal bowel sounds, without guarding, and without rebound.   Rectal:  Deferred until time of colonoscopy.   Msk:  Symmetrical without gross deformities. Normal posture. Pulses:  Normal pulses noted. Extremities:  Without clubbing or edema. Neurologic:  Alert and  oriented x4;  grossly normal neurologically. Skin:  Intact without significant lesions or rashes. Cervical Nodes:  No significant cervical adenopathy. Psych:  Alert and cooperative. Normal mood and affect.

## 2011-07-03 ENCOUNTER — Encounter (HOSPITAL_COMMUNITY): Payer: Self-pay | Admitting: Pharmacy Technician

## 2011-07-07 ENCOUNTER — Telehealth: Payer: Self-pay | Admitting: Gastroenterology

## 2011-07-07 NOTE — Telephone Encounter (Signed)
pts procedure moved to 07/12/11- he is aware of new arrival time and instruction dates

## 2011-07-11 MED ORDER — SODIUM CHLORIDE 0.45 % IV SOLN
Freq: Once | INTRAVENOUS | Status: AC
  Administered 2011-07-12: 10:00:00 via INTRAVENOUS

## 2011-07-12 ENCOUNTER — Encounter (HOSPITAL_COMMUNITY): Payer: Self-pay

## 2011-07-12 ENCOUNTER — Other Ambulatory Visit: Payer: Self-pay | Admitting: Gastroenterology

## 2011-07-12 ENCOUNTER — Encounter (HOSPITAL_COMMUNITY)
Admission: RE | Disposition: A | Discharge status: Home / Self Care | Payer: Self-pay | Source: Ambulatory Visit | Attending: Gastroenterology

## 2011-07-12 ENCOUNTER — Ambulatory Visit (HOSPITAL_COMMUNITY)
Admission: RE | Admit: 2011-07-12 | Discharge: 2011-07-12 | Disposition: A | Discharge status: Home / Self Care | Payer: Medicare HMO | Source: Ambulatory Visit | Attending: Gastroenterology | Admitting: Gastroenterology

## 2011-07-12 DIAGNOSIS — D126 Benign neoplasm of colon, unspecified: Secondary | ICD-10-CM | POA: Insufficient documentation

## 2011-07-12 DIAGNOSIS — Z1211 Encounter for screening for malignant neoplasm of colon: Secondary | ICD-10-CM

## 2011-07-12 DIAGNOSIS — D128 Benign neoplasm of rectum: Secondary | ICD-10-CM | POA: Insufficient documentation

## 2011-07-12 DIAGNOSIS — Z7982 Long term (current) use of aspirin: Secondary | ICD-10-CM | POA: Insufficient documentation

## 2011-07-12 DIAGNOSIS — I10 Essential (primary) hypertension: Secondary | ICD-10-CM | POA: Insufficient documentation

## 2011-07-12 DIAGNOSIS — K648 Other hemorrhoids: Secondary | ICD-10-CM

## 2011-07-12 DIAGNOSIS — K621 Rectal polyp: Secondary | ICD-10-CM

## 2011-07-12 DIAGNOSIS — K620 Anal polyp: Secondary | ICD-10-CM

## 2011-07-12 DIAGNOSIS — Z79899 Other long term (current) drug therapy: Secondary | ICD-10-CM | POA: Insufficient documentation

## 2011-07-12 HISTORY — PX: COLONOSCOPY: SHX5424

## 2011-07-12 SURGERY — COLONOSCOPY
Anesthesia: Moderate Sedation

## 2011-07-12 MED ORDER — STERILE WATER FOR IRRIGATION IR SOLN
Status: DC | PRN
  Administered 2011-07-12: 10:00:00

## 2011-07-12 MED ORDER — MEPERIDINE HCL 100 MG/ML IJ SOLN
INTRAMUSCULAR | Status: AC
  Filled 2011-07-12: qty 2

## 2011-07-12 MED ORDER — FENTANYL CITRATE 0.05 MG/ML IJ SOLN
INTRAMUSCULAR | Status: AC
  Filled 2011-07-12: qty 4

## 2011-07-12 MED ORDER — MIDAZOLAM HCL 5 MG/5ML IJ SOLN
INTRAMUSCULAR | Status: AC
  Filled 2011-07-12: qty 10

## 2011-07-12 MED ORDER — HYDRALAZINE HCL 20 MG/ML IJ SOLN
10.0000 mg | Freq: Once | INTRAMUSCULAR | Status: AC
  Administered 2011-07-12: 10 mg via INTRAVENOUS

## 2011-07-12 MED ORDER — FENTANYL CITRATE 0.05 MG/ML IJ SOLN
INTRAMUSCULAR | Status: DC | PRN
  Administered 2011-07-12 (×3): 25 ug via INTRAVENOUS

## 2011-07-12 MED ORDER — MIDAZOLAM HCL 5 MG/5ML IJ SOLN
INTRAMUSCULAR | Status: DC | PRN
  Administered 2011-07-12 (×2): 2 mg via INTRAVENOUS
  Administered 2011-07-12: 1 mg via INTRAVENOUS

## 2011-07-12 MED ORDER — HYDRALAZINE HCL 20 MG/ML IJ SOLN
INTRAMUSCULAR | Status: AC
  Filled 2011-07-12: qty 1

## 2011-07-12 NOTE — Interval H&P Note (Signed)
History and Physical Interval Note:  07/12/2011 9:57 AM  Matthew Kane  has presented today for surgery, with the diagnosis of SCREENING TCS  The various methods of treatment have been discussed with the patient and family. After consideration of risks, benefits and other options for treatment, the patient has consented to  Procedure(s): COLONOSCOPY as a surgical intervention .  The patients' history has been reviewed, patient examined, no change in status, stable for surgery.  I have reviewed the patients' chart and labs.  Questions were answered to the patient's satisfaction.     [employer]

## 2011-07-12 NOTE — H&P (Signed)
BP Pulse Temp(Src) Ht Wt BMI    150/100  76  98.1 F (36.7 C) (Temporal)  6' (1.829 m)  232 lb 9.6 oz (105.507 kg)  31.55 kg/m2       Progress Notes     Lorenza Burton, NP  06/21/2011  3:23 PM  Signed Primary Care Physician:  Evlyn Courier, MD Primary Gastroenterologist:  Dr. Darrick Penna Nephrologist: Dr. Elvis Coil    Chief Complaint   Patient presents with   .  Colonoscopy      HPI:  Matthew Kane is a 60 y.o. male here as a new patient to set up a screening colonoscopy. He has history of chronic kidney disease and is on dialysis. He is going to [facility] for preop testing for renal transplant. He was told he needed to have a screening colonoscopy. He occasionally has loose stools 3-4 times times per week, nothing more than 2-3 stools per day.  Denies abd pain.  History of chronic GERD on prilosec 20mg  prn.  Tells me since she's been on dialysis he has had very little GERD symptoms. He has had a weight loss of 30# intentionally since going on dialysis.   Denies any nausea, vomiting, dysphagia, odynophagia or anorexia.   Denies any lower GI symptoms including constipation, rectal bleeding, melena or weight loss.    Past Medical History   Diagnosis  Date   .  Hypertension     .  CKD (chronic kidney disease)         on dialysis T/TH/Sat    .  OSA (obstructive sleep apnea)     .  Obesity     .  Gout     .  Leg cramps     .  GERD (gastroesophageal reflux disease)     .  ED (erectile dysfunction)     .  Prostatic hypertrophy, benign     .  Sleep apnea     .  Allergic rhinitis      Past Surgical History   Procedure  Date   .  Hip surgery         left   .  Portacath placement     .  Av fistula placement     .  Cholecystectomy     .  Finger amputation         right middle    Current Outpatient Prescriptions   Medication  Sig  Dispense  Refill   .  allopurinol (ZYLOPRIM) 100 MG tablet  Take 100 mg by mouth daily.           Marland Kitchen  amLODipine (NORVASC)  10 MG tablet  Take 10 mg by mouth daily.           Marland Kitchen  aspirin EC 81 MG tablet  Take 162 mg by mouth daily.           .  calcitRIOL (ROCALTROL) 0.5 MCG capsule  Take 0.5 mcg by mouth daily.           .  cloNIDine (CATAPRES) 0.3 MG tablet  Take 0.3 mg by mouth 2 (two) times daily.           Marland Kitchen  omeprazole (PRILOSEC) 40 MG capsule  Take 40 mg by mouth daily.           Marland Kitchen  triamterene-hydrochlorothiazide (DYAZIDE) 37.5-25 MG per capsule  Take 1 capsule by mouth every morning.           Marland Kitchen  multivitamin (RENA-VIT) TABS tablet  Take 1 capsule by mouth Daily.         .  potassium chloride (KLOR-CON) 10 MEQ CR tablet  Take 10 mEq by mouth daily.            Allergies as of 06/21/2011 - Review Complete 06/21/2011   Allergen  Reaction  Noted   .  Penicillins    10/29/2006    Family History:There is no known family history of colorectal carcinoma , liver disease, or inflammatory bowel disease.   Problem  Relation  Age of Onset   .  Lung cancer  Father  55   .  Liver cancer  Father  28   .  Heart attack  Mother  53   .  Cancer  Brother         unk       History       Social History   .  Marital Status:  Divorced       Spouse Name:  N/A       Number of Children:  3   .  Years of Education:  N/A       Occupational History   .  disabled         Social History Main Topics   .  Smoking status:  Never Smoker    .  Smokeless tobacco:  Not on file   .  Alcohol Use:  No   .  Drug Use:  No   .  Sexually Active:  Not on file       Other Topics  Concern   .  Not on file       Social History Narrative     Lives alone    Review of Systems: Gen: Denies any fever, chills, sweats, anorexia, fatigue, weakness, malaise, weight loss, and sleep disorder CV: Denies chest pain, angina, palpitations, syncope, orthopnea, PND, peripheral edema, and claudication. Resp: Denies dyspnea at rest, dyspnea with exercise, cough, sputum, wheezing, coughing up blood, and pleurisy. GI: Denies vomiting blood,  jaundice, and fecal incontinence.   Denies dysphagia or odynophagia. GU : Denies urinary burning, blood in urine, urinary frequency, urinary hesitancy, nocturnal urination, and urinary incontinence. MS: Denies joint pain, limitation of movement, and swelling, stiffness, low back pain, extremity pain. Denies muscle weakness, cramps, atrophy.   Derm: Denies rash, itching, dry skin, hives, moles, warts, or unhealing ulcers.   Psych: Denies depression, anxiety, memory loss, suicidal ideation, hallucinations, paranoia, and confusion. Heme: Denies bruising, bleeding, and enlarged lymph nodes.   Physical Exam: BP 150/100  Pulse 76  Temp(Src) 98.1 F (36.7 C) (Temporal)  Ht 6' (1.829 m)  Wt 232 lb 9.6 oz (105.507 kg)  BMI 31.55 kg/m2 General:   Alert,  Well-developed, well-nourished, pleasant and cooperative in NAD Head:  Normocephalic and atraumatic. Eyes:  Sclera clear, no icterus.   Conjunctiva pink. Ears:  Normal auditory acuity. Nose:  No deformity, discharge,  or lesions. Mouth:  No deformity or lesions,oropharynx pink & moist. Neck:  Supple; no masses or thyromegaly. Lungs:  Clear throughout to auscultation.   Right upper anterior chest with AV fistula and dressing intact. No wheezes, crackles, or rhonchi. No acute distress. Heart:  Regular rate and rhythm; no murmurs, clicks, rubs,  or gallops. Abdomen:  Soft, nontender and nondistended. No masses, hepatosplenomegaly or hernias noted. Normal bowel sounds, without guarding, and without rebound.    Rectal:  Deferred until time of colonoscopy.  Msk:  Symmetrical without gross deformities. Normal posture. Pulses:  Normal pulses noted. Extremities:  Without clubbing or edema. Neurologic:  Alert and  oriented x4;  grossly normal neurologically. Skin:  Intact without significant lesions or rashes. Cervical Nodes:  No significant cervical adenopathy. Psych:  Alert and cooperative. Normal mood and affect.    Glendora Score  06/21/2011   3:30 PM  Signed Cc to PCP     GERD - Lorenza Burton, NP  06/21/2011  3:21 PM  Signed Rare symptoms. When necessary PPI.  Screen for colon cancer Lorenza Burton, NP  06/21/2011  3:22 PM  Signed ORIEL RUMBOLD is a 60 y.o. male with history of chronic kidney disease. He is undergoing workup for kidney transplant. He needs a screening colonoscopy. He denies any GI symptoms at this time except for rare diarrhea and heartburn.  I have discussed risks & benefits which include, but are not limited to, bleeding, infection, perforation & drug reaction.  The patient agrees with this plan & written consent will be obtained.

## 2011-07-12 NOTE — Op Note (Signed)
[facility] [address] [place], [place]  [address]  COLONOSCOPY PROCEDURE REPORT  PATIENT:  Matthew Kane, Matthew Kane  MR#:  [id number] BIRTHDATE:  1951-02-03, 60 yrs. old  GENDER:  male  ENDOSCOPIST:  Jonette Eva, MD REF. BY:  Elvis Coil, M.D. PCP: DR. HILL ASSISTANT:  PROCEDURE DATE:  07/12/2011 PROCEDURE:  Colonoscopy with biopsy and snare polypectomy  INDICATIONS:  AVERAGE RISK SCREENING PENDING RENAL tX  MEDICATIONS:   Fentanyl 75 mcg IV, Versed 5 mg IV  DESCRIPTION OF PROCEDURE:    Physical exam was performed. Informed consent was obtained from the patient after explaining the benefits, risks, and alternatives to procedure.  The patient was connected to monitor and placed in left lateral position. Continuous oxygen was provided by nasal cannula and IV medicine administered through an indwelling cannula.  After administration of sedation and rectal exam, the patient's rectum was intubated and the EC-[id number] ([id number]) colonoscope was advanced under direct visualization to the cecum.  The scope was removed slowly by carefully examining the color, texture, anatomy, and integrity mucosa on the way out.  The patient was recovered in endoscopy and discharged home in satisfactory condition. <<PROCEDUREIMAGES>>  FINDINGS:  There were FOUR polyps (6-8MM) identified and removed at the hepatic flexure VIA SNARE CAUTERY.  There were TWO polyps identified and removed in the descending colon VIA SNARE CAUTERY. There were TWO polyps identified and removed in the rectum VIA COLD FORCEPS. SMALL INTERNAL HEMORRHOIDS. POLYPS < 5 MM IN TEH RIGHT COLON WOULD HAVE BEEN EASILY MISSED.  PREP QUALITY: FAIR IN RIGHT COLON, GOOD IN LEFT COLON  CECAL W/D TIME:    45 minutes  COMPLICATIONS:    None  ENDOSCOPIC IMPRESSION: 1) Polyps, multiple at the hepatic flexure 2) Polyps, multiple in the descending colon 3) Polyps, multiple in the rectum 4) SMALL  HEMORRHOIDS  RECOMMENDATIONS: TCS IN 5 YEARS WITH OVERTUBE & 2 DAY BOWEL PREP-NEEDS 1 HOUR TIME SLOT HIGH FIBER DIET RESTART ASA 12/9 & HEPARIN W/ HD 12/11. DISCUSSED WITH [facility] AT [phone] AWAIT BIOPSIES  REPEAT EXAM:  No  ______________________________ Jonette Eva, MD  CC:  Elvis Coil, M.D.  n. eSIGNEDDuncan Dull Fields at 07/12/2011 12:26 PM  Letitia Libra, [phone]

## 2011-07-13 ENCOUNTER — Ambulatory Visit: Payer: Medicare HMO | Admitting: Vascular Surgery

## 2011-07-14 ENCOUNTER — Telehealth: Payer: Self-pay | Admitting: Gastroenterology

## 2011-07-14 NOTE — Telephone Encounter (Signed)
Pt aware of results 

## 2011-07-14 NOTE — Telephone Encounter (Signed)
Please call pt. HE had simple adenomas removed from HIS colon. RESTART ASA ON 12/9. TCS IN 5 years DUE TO POOR PREP IN THE RIGHT COLON. High fiber diet.

## 2011-07-14 NOTE — Telephone Encounter (Signed)
Results Cc to PCP  

## 2011-07-17 NOTE — Telephone Encounter (Signed)
Routing to Susan to nic. 

## 2011-07-20 NOTE — Telephone Encounter (Signed)
tcs in 5 years

## 2011-07-25 ENCOUNTER — Encounter (HOSPITAL_COMMUNITY): Payer: Self-pay | Admitting: Gastroenterology

## 2011-08-03 ENCOUNTER — Emergency Department (HOSPITAL_COMMUNITY): Payer: Medicare HMO

## 2011-08-03 ENCOUNTER — Encounter (HOSPITAL_COMMUNITY): Payer: Self-pay | Admitting: *Deleted

## 2011-08-03 ENCOUNTER — Emergency Department (HOSPITAL_COMMUNITY)
Admission: EM | Admit: 2011-08-03 | Discharge: 2011-08-03 | Disposition: A | Discharge status: Home / Self Care | Payer: Medicare HMO | Attending: Emergency Medicine | Admitting: Emergency Medicine

## 2011-08-03 DIAGNOSIS — E669 Obesity, unspecified: Secondary | ICD-10-CM | POA: Insufficient documentation

## 2011-08-03 DIAGNOSIS — K219 Gastro-esophageal reflux disease without esophagitis: Secondary | ICD-10-CM | POA: Insufficient documentation

## 2011-08-03 DIAGNOSIS — R300 Dysuria: Secondary | ICD-10-CM | POA: Insufficient documentation

## 2011-08-03 DIAGNOSIS — N509 Disorder of male genital organs, unspecified: Secondary | ICD-10-CM | POA: Insufficient documentation

## 2011-08-03 DIAGNOSIS — G4733 Obstructive sleep apnea (adult) (pediatric): Secondary | ICD-10-CM | POA: Insufficient documentation

## 2011-08-03 DIAGNOSIS — N390 Urinary tract infection, site not specified: Secondary | ICD-10-CM | POA: Insufficient documentation

## 2011-08-03 DIAGNOSIS — I10 Essential (primary) hypertension: Secondary | ICD-10-CM | POA: Insufficient documentation

## 2011-08-03 DIAGNOSIS — Z79899 Other long term (current) drug therapy: Secondary | ICD-10-CM | POA: Insufficient documentation

## 2011-08-03 LAB — URINE MICROSCOPIC-ADD ON

## 2011-08-03 LAB — URINALYSIS, ROUTINE W REFLEX MICROSCOPIC
Glucose, UA: 100 mg/dL — AB
Protein, ur: >300 mg/dL — AB
pH: 8 (ref 5.0–8.0)

## 2011-08-03 MED ORDER — HYDROMORPHONE HCL PF 1 MG/ML IJ SOLN
1.0000 mg | Freq: Once | INTRAMUSCULAR | Status: AC
  Administered 2011-08-03: 1 mg via INTRAVENOUS
  Filled 2011-08-03: qty 1

## 2011-08-03 MED ORDER — DOXYCYCLINE HYCLATE 100 MG PO CAPS: 100.0000 mg | ORAL_CAPSULE | Freq: Two times a day (BID) | ORAL | Status: AC

## 2011-08-03 MED ORDER — OXYCODONE-ACETAMINOPHEN 5-325 MG PO TABS: 1.0000 | ORAL_TABLET | ORAL | Status: AC | PRN

## 2011-08-03 NOTE — ED Notes (Signed)
Painful urination, patient is on dialysis

## 2011-08-03 NOTE — ED Provider Notes (Signed)
History     CSN: [id number]  Arrival date & time 08/03/11  4098   First MD Initiated Contact with Patient 08/03/11 1950      Chief Complaint  Patient presents with  . Dysuria    Patient is a 60 y.o. male presenting with testicular pain. The history is provided by the patient.  Testicle Pain This is a new problem. The current episode started yesterday. Episode frequency: with urination. The problem has been gradually worsening. Pertinent negatives include no chest pain, no abdominal pain and no shortness of breath. Exacerbated by: urination. The symptoms are relieved by nothing.  pt reports he is on dialysis, he had full dialysis today.  He makes some urine Since last night he has pain with urination in his testicle No trauma No rectal pain No vomiting No fever  Past Medical History  Diagnosis Date  . Hypertension   . CKD (chronic kidney disease)     on dialysis T/TH/Sat   . OSA (obstructive sleep apnea)   . Obesity   . Gout   . Leg cramps   . GERD (gastroesophageal reflux disease)   . ED (erectile dysfunction)   . Prostatic hypertrophy, benign   . Sleep apnea   . Allergic rhinitis   . Renal disorder     Past Surgical History  Procedure Date  . Hip surgery     left  . Portacath placement   . Av fistula placement   . Cholecystectomy   . Finger amputation     right middle  . Colonoscopy 07/12/2011    Procedure: COLONOSCOPY;  Surgeon: Arlyce Harman, MD;  Location: [facility];  Service: Endoscopy;  Laterality: N/A;  1:00    Family History  Problem Relation Age of Onset  . Lung cancer Father 77  . Liver cancer Father 50  . Heart attack Mother 53  . Cancer Brother     unk    History  Substance Use Topics  . Smoking status: Never Smoker   . Smokeless tobacco: Not on file  . Alcohol Use: No      Review of Systems  Respiratory: Negative for shortness of breath.   Cardiovascular: Negative for chest pain.  Gastrointestinal: Negative for abdominal  pain.  Genitourinary: Positive for testicular pain.  All other systems reviewed and are negative.    Allergies  Penicillins  Home Medications   Current Outpatient Rx  Name Route Sig Dispense Refill  . AMLODIPINE BESYLATE 10 MG PO TABS Oral Take 10 mg by mouth daily.      Marland Kitchen CALCITRIOL 0.5 MCG PO CAPS Oral Take 0.5 mcg by mouth daily.      Marland Kitchen CLONIDINE HCL 0.3 MG PO TABS Oral Take 0.3 mg by mouth 2 (two) times daily.      Marland Kitchen METOPROLOL TARTRATE 50 MG PO TABS Oral Take 50 mg by mouth Twice daily.    Marland Kitchen RENA-VITE PO TABS Oral Take 1 capsule by mouth Daily.    . TRIAMTERENE-HCTZ 37.5-25 MG PO CAPS Oral Take 1 capsule by mouth every morning.        BP 156/101  Pulse 72  Temp(Src) 98.2 F (36.8 C) (Oral)  Resp 16  SpO2 100%  Physical Exam CONSTITUTIONAL: Well developed/well nourished HEAD AND FACE: Normocephalic/atraumatic EYES: EOMI/PERRL ENMT: Mucous membranes moist NECK: supple no meningeal signs SPINE:entire spine nontender CV: S1/S2 noted, no murmurs/rubs/gallops noted Chest - dialysis catheter noted in right chest, nontender LUNGS: Lungs are clear to auscultation bilaterally, no apparent  distress ABDOMEN: soft, nontender, no rebound or guarding GU:no cva tenderness Left testicle tender to palpation and it is edematous.  No erythema.  No abscess. No hernia noted Chaperone present No penile tenderness noted NEURO: Pt is awake/alert, moves all extremitiesx4 EXTREMITIES: pulses normal, full ROM Dialysis access to left forearm - thrill noted SKIN: warm, color normal PSYCH: no abnormalities of mood noted  ED Course  Procedures    Labs Reviewed  URINALYSIS, ROUTINE W REFLEX MICROSCOPIC   8:36 PM Pt will need US imaging to eval for possible testicular torsion  Pt improved Hydrocele on [place] only Pt reports recent rectal/prostate exam and does not wish to have this re-examined Will send urine for culture, doxy for ten days and close f/u with PCP  MDM  Nursing notes  reviewed and considered in documentation All labs/vitals reviewed and considered         Joya Gaskins, MD 08/03/11 2217

## 2011-08-05 LAB — URINE CULTURE: Culture  Setup Time: 201212281932

## 2011-08-16 ENCOUNTER — Encounter: Payer: Self-pay | Admitting: Vascular Surgery

## 2011-08-17 ENCOUNTER — Ambulatory Visit (INDEPENDENT_AMBULATORY_CARE_PROVIDER_SITE_OTHER): Payer: Medicare HMO | Admitting: Vascular Surgery

## 2011-08-17 ENCOUNTER — Encounter: Payer: Self-pay | Admitting: Vascular Surgery

## 2011-08-17 VITALS — BP 136/84 | HR 74 | Temp 98.3°F | Ht 72.0 in | Wt 231.0 lb

## 2011-08-17 DIAGNOSIS — N186 End stage renal disease: Secondary | ICD-10-CM

## 2011-08-17 NOTE — Progress Notes (Signed)
Patient is a 61 year old male who returns for followup today. He previously had a right radiocephalic AV fistula which was causing him some problems. This was ligated on October 24. He had a left radiocephalic AV fistula placed at the same time. He is currently dialyzes on Tuesday Thursday Saturday and [place] via a catheter. He denies any symptoms of numbness tingling or steal in the left arm.  Physical Exam: Filed Vitals:   08/17/11 1154  BP: 136/84  Pulse: 74  Temp: 98.3 F (36.8 C)  TempSrc: Oral  Height: 6' (1.829 m)  Weight: 231 lb (104.781 kg)    Right upper extremity radiocephalic incision is well-healed  Left upper extremity there is an easily palpable thrill and audible bruit in the left greater cephalic AV fistula. The vein is palpable throughout its course from the wrist all the way to the antecubital fossa.  Assessment: Maturing AV fistula left arm. It should be ready to cannulate January 24. He was given a note to take his center today. He will followup with [place] on as-needed basis if they had trouble cannulating the fistula.  Fabienne Bruns, MD [facility] Office: [phone] Pager: [phone]

## 2011-09-19 ENCOUNTER — Encounter: Payer: Self-pay | Admitting: Physician Assistant

## 2011-09-20 ENCOUNTER — Other Ambulatory Visit: Payer: Self-pay | Admitting: *Deleted

## 2011-09-20 ENCOUNTER — Encounter: Payer: Self-pay | Admitting: *Deleted

## 2011-09-20 ENCOUNTER — Ambulatory Visit (INDEPENDENT_AMBULATORY_CARE_PROVIDER_SITE_OTHER): Payer: Medicare HMO | Admitting: Thoracic Diseases

## 2011-09-20 ENCOUNTER — Encounter (INDEPENDENT_AMBULATORY_CARE_PROVIDER_SITE_OTHER): Payer: Medicare HMO | Admitting: Vascular Surgery

## 2011-09-20 VITALS — BP 162/98 | HR 67 | Resp 20 | Ht 72.0 in | Wt 230.0 lb

## 2011-09-20 DIAGNOSIS — N186 End stage renal disease: Secondary | ICD-10-CM

## 2011-09-20 DIAGNOSIS — T82898A Other specified complication of vascular prosthetic devices, implants and grafts, initial encounter: Secondary | ICD-10-CM

## 2011-09-20 NOTE — Progress Notes (Signed)
[facility] [place]  Postoperative Visit hemodialysis access   Date of Surgery: 05/31/11 left Radiocephalic AVF Surgeon: [facility] HD Center: [facility] HD T , [place], Sat.  HPI: Matthew Kane is a 61 y.o. male who is 3 1/2 months S/P creation/revision of left upper extremity Hemodialysis access. The patient denies symptoms of numbness, tingling, weakness and denies pain in the operative limb. Patient is here for post -op evaluation to assess  maturation of left Cimino Fistula. Pt states the dialysis center has been able to cannulate fistula but he was referred by Dr. Allena Katz for difficult cannulation. The patient has a Right IJ catheter in place.  Pt is on hemodialysis through  right IJ cath on T [facility] Sat.  Past Medical History  Diagnosis Date  . Hypertension   . CKD (chronic kidney disease)     on dialysis T/TH/Sat   . OSA (obstructive sleep apnea)   . Obesity   . Gout   . Leg cramps   . GERD (gastroesophageal reflux disease)   . ED (erectile dysfunction)   . Prostatic hypertrophy, benign   . Sleep apnea   . Allergic rhinitis   . Renal disorder    History   Social History  . Marital Status: Divorced    Spouse Name: N/A    Number of Children: 3  . Years of Education: N/A   Occupational History  . disabled    Social History Main Topics  . Smoking status: Never Smoker   . Smokeless tobacco: Never Used  . Alcohol Use: No  . Drug Use: No  . Sexually Active: Not on file   Other Topics Concern  . Not on file   Social History Narrative   Lives alone    Current Outpatient Prescriptions  Medication Sig Dispense Refill  . amLODipine (NORVASC) 10 MG tablet Take 10 mg by mouth daily.        . calcitRIOL (ROCALTROL) 0.5 MCG capsule Take 0.5 mcg by mouth daily.        . cloNIDine (CATAPRES) 0.3 MG tablet Take 0.3 mg by mouth 2 (two) times daily.        . metoprolol (LOPRESSOR) 50 MG tablet Take 50 mg by mouth Twice daily.      . multivitamin (RENA-VIT) TABS  tablet Take 1 capsule by mouth Daily.      Marland Kitchen omeprazole (PRILOSEC) 40 MG capsule       . triamterene-hydrochlorothiazide (DYAZIDE) 37.5-25 MG per capsule Take 1 capsule by mouth every morning.          Allergies  Allergen Reactions  . Penicillins     REACTION: Rash and itching   Family History  Problem Relation Age of Onset  . Lung cancer Father 9  . Liver cancer Father 48  . Heart attack Mother 34  . Cancer Brother     unk   ROS - reviewed ESRD ,denies DOE, Chest pain. unchanged from previous surgery in october  Physical Examination  Filed Vitals:   09/20/11 1323  BP: 162/98  Pulse: 67  Resp: 20    WDWN male in NAD. Lungs are clear without wheezes or rales Heart RRR left upper extremity Incision is healed Skin color is normal   Hand grip is 5/5 and sensation in digits is intact; There is a good thrill and good bruit in the AVF. The graft/fistula is fairlyeasily palpable and of adequate size  he has a 2+ radial pulse, hand is warm and pink  Duplex of the AVF shows velocity of 1028 at the anastomotic area. It is > than 6mm in size and has multiple branches - one of which is 5 mm  Assessment/Plan Matthew Kane is a 61 y.o. year old who is s/p creation/revision of left upper extremity Hemodialysis access that is difficult to cannulate but has adequate flow during dialysis. Size and depth are good. There is a high velocity area at the anastomosis. Will schedule pt for a fistulogram  Pt should be dialyzed through HD cath until after Fistulogram on 10/06/11  Clinic MD: CSD

## 2011-09-27 ENCOUNTER — Encounter (HOSPITAL_COMMUNITY): Payer: Self-pay | Admitting: Pharmacy Technician

## 2011-09-28 ENCOUNTER — Other Ambulatory Visit: Payer: Self-pay | Admitting: *Deleted

## 2011-09-29 NOTE — Procedures (Unsigned)
VASCULAR LAB EXAM  INDICATION:  End-stage renal disease; complications of arteriovenous fistula.  HISTORY: Diabetes:  No. Cardiac:  No. Hypertension:  Yes.  EXAM:  Left radiocephalic arteriovenous fistula duplex.  IMPRESSION: 1. Elevated velocities with turbulence present suggestive of     significant stenosis and a peak systolic velocity greater than 1028     cm/s of the venous outflow at wrist level. 2. Multiple branches present as noted on worksheet. 3. Depths and diameters obtained and as noted on worksheet.  ___________________________________________ Janetta Hora. Fields, MD  SH/MEDQ  D:  09/20/2011  T:  09/20/2011  Job:  [id number]

## 2011-10-06 ENCOUNTER — Ambulatory Visit (HOSPITAL_COMMUNITY)
Admission: RE | Admit: 2011-10-06 | Discharge: 2011-10-06 | Disposition: A | Discharge status: Home / Self Care | Payer: Medicare HMO | Source: Ambulatory Visit | Attending: Vascular Surgery | Admitting: Vascular Surgery

## 2011-10-06 ENCOUNTER — Encounter (HOSPITAL_COMMUNITY)
Admission: RE | Disposition: A | Discharge status: Home / Self Care | Payer: Self-pay | Source: Ambulatory Visit | Attending: Vascular Surgery

## 2011-10-06 DIAGNOSIS — K219 Gastro-esophageal reflux disease without esophagitis: Secondary | ICD-10-CM | POA: Insufficient documentation

## 2011-10-06 DIAGNOSIS — G4733 Obstructive sleep apnea (adult) (pediatric): Secondary | ICD-10-CM | POA: Insufficient documentation

## 2011-10-06 DIAGNOSIS — M109 Gout, unspecified: Secondary | ICD-10-CM | POA: Insufficient documentation

## 2011-10-06 DIAGNOSIS — E669 Obesity, unspecified: Secondary | ICD-10-CM | POA: Insufficient documentation

## 2011-10-06 DIAGNOSIS — Y832 Surgical operation with anastomosis, bypass or graft as the cause of abnormal reaction of the patient, or of later complication, without mention of misadventure at the time of the procedure: Secondary | ICD-10-CM | POA: Insufficient documentation

## 2011-10-06 DIAGNOSIS — N186 End stage renal disease: Secondary | ICD-10-CM | POA: Insufficient documentation

## 2011-10-06 DIAGNOSIS — N400 Enlarged prostate without lower urinary tract symptoms: Secondary | ICD-10-CM | POA: Insufficient documentation

## 2011-10-06 DIAGNOSIS — T82898A Other specified complication of vascular prosthetic devices, implants and grafts, initial encounter: Secondary | ICD-10-CM | POA: Insufficient documentation

## 2011-10-06 DIAGNOSIS — N529 Male erectile dysfunction, unspecified: Secondary | ICD-10-CM | POA: Insufficient documentation

## 2011-10-06 DIAGNOSIS — Z992 Dependence on renal dialysis: Secondary | ICD-10-CM | POA: Insufficient documentation

## 2011-10-06 DIAGNOSIS — I120 Hypertensive chronic kidney disease with stage 5 chronic kidney disease or end stage renal disease: Secondary | ICD-10-CM | POA: Insufficient documentation

## 2011-10-06 HISTORY — PX: SHUNTOGRAM: SHX5491

## 2011-10-06 LAB — POCT I-STAT, CHEM 8
Creatinine, Ser: 8.6 mg/dL — ABNORMAL HIGH (ref 0.50–1.35)
HCT: 37 % — ABNORMAL LOW (ref 39.0–52.0)
Hemoglobin: 12.6 g/dL — ABNORMAL LOW (ref 13.0–17.0)
Sodium: 138 mEq/L (ref 135–145)
TCO2: 28 mmol/L (ref 0–100)

## 2011-10-06 SURGERY — ASSESSMENT, SHUNT FUNCTION, WITH CONTRAST RADIOGRAPHIC STUDY
Anesthesia: LOCAL

## 2011-10-06 MED ORDER — ONDANSETRON HCL 4 MG/2ML IJ SOLN: 4.0000 mg | Freq: Four times a day (QID) | INTRAMUSCULAR | Status: DC | PRN

## 2011-10-06 MED ORDER — METOPROLOL TARTRATE 1 MG/ML IV SOLN: 2.0000 mg | INTRAVENOUS | Status: DC | PRN

## 2011-10-06 MED ORDER — SODIUM CHLORIDE 0.9 % IJ SOLN: 3.0000 mL | INTRAMUSCULAR | Status: DC | PRN

## 2011-10-06 MED ORDER — LIDOCAINE HCL (PF) 1 % IJ SOLN
INTRAMUSCULAR | Status: AC
  Filled 2011-10-06: qty 30

## 2011-10-06 MED ORDER — HEPARIN (PORCINE) IN NACL 2-0.9 UNIT/ML-% IJ SOLN
INTRAMUSCULAR | Status: AC
  Filled 2011-10-06: qty 1000

## 2011-10-06 MED ORDER — ACETAMINOPHEN 325 MG RE SUPP
325.0000 mg | RECTAL | Status: DC | PRN
  Filled 2011-10-06: qty 2

## 2011-10-06 MED ORDER — HYDRALAZINE HCL 20 MG/ML IJ SOLN: 10.0000 mg | INTRAMUSCULAR | Status: DC | PRN

## 2011-10-06 MED ORDER — LABETALOL HCL 5 MG/ML IV SOLN: 10.0000 mg | INTRAVENOUS | Status: DC | PRN

## 2011-10-06 MED ORDER — ACETAMINOPHEN 325 MG PO TABS
325.0000 mg | ORAL_TABLET | ORAL | Status: DC | PRN
  Filled 2011-10-06: qty 2

## 2011-10-06 NOTE — Op Note (Signed)
Procedure: Leftt radial cephalic fistulogram  Preoperative diagnosis: Poor flow AV fistula  Postoperative diagnosis: Same  Anesthesia: Local  Operative details: After obtaining informed consent, the patient was taken to the PV lab. The patient was placed in supine position on the Angio table. Entire left upper extremity was prepped and draped in usual sterile fashion.   Local anesthesia was infiltrated over the proximal portion of the fistula in the left arm.   A micropuncture needle was brought up on the operative field and this was used to directly cannulate the fistula under ultrasound guidance. A micropuncture wire was then threaded into the fistula and the micropuncture sheath threaded over this. Sheath was thoroughly flushed with heparinized saline and the dilator was removed. Contrast angiogram was then performed of the fistula.   The central venous structures are patent. The fistula is patent in its midportion.  There are several side branches.  There is normal antecubital anatomy with patent basilic and cephalic vein.  .  Next, pressure was held on the upper arm in order to reflux contrast across the arterial anastomosis. The arterial anastomosis is patent with no narrowing.  A 3-0 Monocryl pursestring stitch was placed around the sheath and the sheath removed. Hemostasis was obtained.  The patient tolerated the procedure well and there were no complications. The patient was taken to the holding area in stable condition.  Operative findings: Fistula is ready for use          If continued poor flow will consider ligation of side branches  Fabienne Bruns, MD [facility] [place] Office: [phone] Pager: [phone]

## 2011-10-06 NOTE — Interval H&P Note (Signed)
History and Physical Interval Note:  10/06/2011 8:52 AM  Matthew Kane  has presented today for surgery, with the diagnosis of Endstage Renal  The various methods of treatment have been discussed with the patient and family. After consideration of risks, benefits and other options for treatment, the patient has consented to  Procedure(s) (LRB): SHUNTOGRAM (N/A) as a surgical intervention .  The patients' history has been reviewed, patient examined, no change in status, stable for surgery.  I have reviewed the patients' chart and labs.  Questions were answered to the patient's satisfaction.     Kekai Geter E

## 2011-10-06 NOTE — Discharge Instructions (Signed)
No diagram  10/06/2011 Matthew Kane [id number] 26-May-1951  Surgeon(s): Sherren Kerns, MD  Procedure(s): SHUNTOGRAM  Comments: none   May stick graft immediately         The Fistula is patent with no narrowing.  If there is continued difficulty sticking the fistula we could consider ligating side branches.  Feel free to schedule him for further evaluation as needed.  Fabienne Bruns, MD [facility] [place] Office: [phone] Pager: [phone]

## 2011-10-06 NOTE — H&P (View-Only) (Signed)
[facility] [place]  Postoperative Visit hemodialysis access   Date of Surgery: 05/31/11 left Radiocephalic AVF Surgeon: [facility] HD Center: [facility] HD T , [place], Sat.  HPI: Matthew Kane is a 61 y.o. male who is 3 1/2 months S/P creation/revision of left upper extremity Hemodialysis access. The patient denies symptoms of numbness, tingling, weakness and denies pain in the operative limb. Patient is here for post -op evaluation to assess  maturation of left Cimino Fistula. Pt states the dialysis center has been able to cannulate fistula but he was referred by Dr. Patel for difficult cannulation. The patient has a Right IJ catheter in place.  Pt is on hemodialysis through  right IJ cath on T [place] Sat.  Past Medical History  Diagnosis Date  . Hypertension   . CKD (chronic kidney disease)     on dialysis T/TH/Sat   . OSA (obstructive sleep apnea)   . Obesity   . Gout   . Leg cramps   . GERD (gastroesophageal reflux disease)   . ED (erectile dysfunction)   . Prostatic hypertrophy, benign   . Sleep apnea   . Allergic rhinitis   . Renal disorder    History   Social History  . Marital Status: Divorced    Spouse Name: N/A    Number of Children: 3  . Years of Education: N/A   Occupational History  . disabled    Social History Main Topics  . Smoking status: Never Smoker   . Smokeless tobacco: Never Used  . Alcohol Use: No  . Drug Use: No  . Sexually Active: Not on file   Other Topics Concern  . Not on file   Social History Narrative   Lives alone    Current Outpatient Prescriptions  Medication Sig Dispense Refill  . amLODipine (NORVASC) 10 MG tablet Take 10 mg by mouth daily.        . calcitRIOL (ROCALTROL) 0.5 MCG capsule Take 0.5 mcg by mouth daily.        . cloNIDine (CATAPRES) 0.3 MG tablet Take 0.3 mg by mouth 2 (two) times daily.        . metoprolol (LOPRESSOR) 50 MG tablet Take 50 mg by mouth Twice daily.      . multivitamin (RENA-VIT) TABS  tablet Take 1 capsule by mouth Daily.      . omeprazole (PRILOSEC) 40 MG capsule       . triamterene-hydrochlorothiazide (DYAZIDE) 37.5-25 MG per capsule Take 1 capsule by mouth every morning.          Allergies  Allergen Reactions  . Penicillins     REACTION: Rash and itching   Family History  Problem Relation Age of Onset  . Lung cancer Father 68  . Liver cancer Father 68  . Heart attack Mother 58  . Cancer Brother     unk   ROS - reviewed ESRD ,denies DOE, Chest pain. unchanged from previous surgery in october  Physical Examination  Filed Vitals:   09/20/11 1323  BP: 162/98  Pulse: 67  Resp: 20    WDWN male in NAD. Lungs are clear without wheezes or rales Heart RRR left upper extremity Incision is healed Skin color is normal   Hand grip is 5/5 and sensation in digits is intact; There is a good thrill and good bruit in the AVF. The graft/fistula is fairlyeasily palpable and of adequate size  he has a 2+ radial pulse, hand is warm and pink    Duplex of the AVF shows velocity of 1028 at the anastomotic area. It is > than 6mm in size and has multiple branches - one of which is 5 mm  Assessment/Plan Matthew Kane is a 61 y.o. year old who is s/p creation/revision of left upper extremity Hemodialysis access that is difficult to cannulate but has adequate flow during dialysis. Size and depth are good. There is a high velocity area at the anastomosis. Will schedule pt for a fistulogram  Pt should be dialyzed through HD cath until after Fistulogram on 10/06/11  Clinic MD: CSD    

## 2011-10-19 ENCOUNTER — Other Ambulatory Visit (HOSPITAL_COMMUNITY): Payer: Self-pay | Admitting: Nephrology

## 2011-10-19 DIAGNOSIS — N186 End stage renal disease: Secondary | ICD-10-CM

## 2011-10-27 ENCOUNTER — Ambulatory Visit (HOSPITAL_COMMUNITY): Admission: RE | Admit: 2011-10-27 | Payer: Medicare HMO | Source: Ambulatory Visit

## 2011-11-10 ENCOUNTER — Other Ambulatory Visit (HOSPITAL_COMMUNITY): Payer: Medicare HMO

## 2011-11-10 ENCOUNTER — Ambulatory Visit (HOSPITAL_COMMUNITY): Admission: RE | Admit: 2011-11-10 | Payer: Medicare HMO | Source: Ambulatory Visit

## 2011-11-17 ENCOUNTER — Ambulatory Visit (HOSPITAL_COMMUNITY): Admission: RE | Admit: 2011-11-17 | Payer: Medicare HMO | Source: Ambulatory Visit

## 2011-11-30 ENCOUNTER — Other Ambulatory Visit: Payer: Self-pay | Admitting: *Deleted

## 2011-12-01 ENCOUNTER — Encounter (HOSPITAL_COMMUNITY): Payer: Self-pay | Admitting: Respiratory Therapy

## 2011-12-08 ENCOUNTER — Encounter: Payer: Self-pay | Admitting: Cardiology

## 2011-12-10 ENCOUNTER — Encounter: Payer: Self-pay | Admitting: Cardiology

## 2011-12-10 DIAGNOSIS — I10 Essential (primary) hypertension: Secondary | ICD-10-CM | POA: Insufficient documentation

## 2011-12-10 DIAGNOSIS — E663 Overweight: Secondary | ICD-10-CM | POA: Insufficient documentation

## 2011-12-10 DIAGNOSIS — K219 Gastro-esophageal reflux disease without esophagitis: Secondary | ICD-10-CM | POA: Insufficient documentation

## 2011-12-10 DIAGNOSIS — M109 Gout, unspecified: Secondary | ICD-10-CM | POA: Insufficient documentation

## 2011-12-10 DIAGNOSIS — N185 Chronic kidney disease, stage 5: Secondary | ICD-10-CM | POA: Insufficient documentation

## 2011-12-10 DIAGNOSIS — N529 Male erectile dysfunction, unspecified: Secondary | ICD-10-CM | POA: Insufficient documentation

## 2011-12-10 DIAGNOSIS — G4733 Obstructive sleep apnea (adult) (pediatric): Secondary | ICD-10-CM | POA: Insufficient documentation

## 2011-12-11 ENCOUNTER — Ambulatory Visit (INDEPENDENT_AMBULATORY_CARE_PROVIDER_SITE_OTHER): Payer: Medicare HMO | Admitting: Cardiology

## 2011-12-11 ENCOUNTER — Encounter: Payer: Self-pay | Admitting: Cardiology

## 2011-12-11 VITALS — BP 158/93 | HR 60 | Resp 17 | Ht 72.0 in | Wt 231.0 lb

## 2011-12-11 DIAGNOSIS — N185 Chronic kidney disease, stage 5: Secondary | ICD-10-CM

## 2011-12-11 DIAGNOSIS — G4733 Obstructive sleep apnea (adult) (pediatric): Secondary | ICD-10-CM

## 2011-12-11 DIAGNOSIS — I10 Essential (primary) hypertension: Secondary | ICD-10-CM

## 2011-12-11 NOTE — Progress Notes (Deleted)
**Note De-Identified  Obfuscation** Name: Matthew Kane    DOB: 14-Jan-1951  Age: 61 y.o.  MR#: [id number]       PCP:  Evlyn Courier, MD, MD      Insurance: @PAYORNAME @   CC:    Chief Complaint  Patient presents with  . Appointment    hypertension -meds/list    VS BP 158/93  Pulse 60  Resp 17  Ht 6' (1.829 m)  Wt 231 lb (104.781 kg)  BMI 31.33 kg/m2  Weights Current Weight  12/11/11 231 lb (104.781 kg)  10/06/11 230 lb (104.327 kg)  10/06/11 230 lb (104.327 kg)    Blood Pressure  BP Readings from Last 3 Encounters:  12/11/11 158/93  10/06/11 139/89  10/06/11 139/89     Admit date:  (Not on file) Last encounter with RMR:  12/10/2011   Allergy Allergies  Allergen Reactions  . Penicillins     REACTION: Rash and itching    Current Outpatient Prescriptions  Medication Sig Dispense Refill  . amLODipine (NORVASC) 10 MG tablet Take 10 mg by mouth daily.        . calcitRIOL (ROCALTROL) 0.5 MCG capsule Take 0.5 mcg by mouth daily.        . cloNIDine (CATAPRES) 0.3 MG tablet Take 0.3 mg by mouth 2 (two) times daily.        . metoprolol (LOPRESSOR) 50 MG tablet Take 50 mg by mouth daily.       . multivitamin (RENA-VIT) TABS tablet Take 1 capsule by mouth Daily.      Marland Kitchen omeprazole (PRILOSEC) 40 MG capsule Take 40 mg by mouth daily.       Marland Kitchen oxyCODONE-acetaminophen (PERCOCET) 5-325 MG per tablet Take 1 tablet by mouth Once daily as needed. For pain      . SENSIPAR 30 MG tablet Take 30 mg by mouth Daily.       Marland Kitchen triamterene-hydrochlorothiazide (DYAZIDE) 37.5-25 MG per capsule Take 1 capsule by mouth every morning.          Discontinued Meds:   There are no discontinued medications.  Patient Active Problem List  Diagnoses  . BENIGN PROSTATIC HYPERTROPHY, HX OF  . Hypertension  . Chronic kidney disease, stage 5, kidney failure  . Obstructive sleep apnea  . Obesity  . Gout  . Gastroesophageal reflux disease  . Erectile dysfunction    LABS Admission on 10/06/2011, Discharged on 10/06/2011  Component Date  Value  . Sodium 10/06/2011 138   . Potassium 10/06/2011 3.8   . Chloride 10/06/2011 101   . BUN 10/06/2011 27*  . Creatinine, Ser 10/06/2011 8.60*  . Glucose, Bld 10/06/2011 94   . Calcium, Ion 10/06/2011 1.00*  . TCO2 10/06/2011 28   . Hemoglobin 10/06/2011 12.6*  . HCT 10/06/2011 37.0*     Results for this Opt Visit:     Results for orders placed during the hospital encounter of 10/06/11  POCT I-STAT, CHEM 8      Component Value Range   Sodium 138  135 - 145 (mEq/L)   Potassium 3.8  3.5 - 5.1 (mEq/L)   Chloride 101  96 - 112 (mEq/L)   BUN 27 (*) 6 - 23 (mg/dL)   Creatinine, Ser 4.09 (*) 0.50 - 1.35 (mg/dL)   Glucose, Bld 94  70 - 99 (mg/dL)   Calcium, Ion 8.11 (*) 1.12 - 1.32 (mmol/L)   TCO2 28  0 - 100 (mmol/L)   Hemoglobin 12.6 (*) 13.0 - 17.0 (g/dL)   HCT 91.4 (*) 78.2 - **Note De-Identified  Obfuscation** 52.0 (%)    EKG Orders placed during the hospital encounter of 05/09/11  . EKG     Prior Assessment and Plan Problem List as of 12/11/2011          Cardiology Problems   Hypertension     Other   BENIGN PROSTATIC HYPERTROPHY, HX OF   Chronic kidney disease, stage 5, kidney failure   Obstructive sleep apnea   Obesity   Gout   Gastroesophageal reflux disease   Erectile dysfunction       Imaging: No results found.   FRS Calculation: Score not calculated. Missing: Total Cholesterol

## 2011-12-11 NOTE — Progress Notes (Signed)
Patient ID: Matthew Kane, male   DOB: [birth year], 61 y.o.   MRN: [id number]  HPI: Initial cardiology consultation performed at the kind request of Dr. Loleta Kane and Matthew Kane for evaluation of cardiac status prior to possible renal transplantation.  This nice gentleman has end-stage renal disease and has required dialysis for the past 9 months.  He has tolerated this well and feels much better than he did prior to the initiation of dialysis.  He has been evaluated by the [facility] who attempted a stress test, but told him that the test was inadequate and that additional testing would be required.  He denies dyspnea or chest discomfort.  Matthew Kane was admitted to hospital in 2006 with accelerated hypertension.  A question of myocardial infarction was raised prompting cardiac catheterization, which revealed minimal luminal irregularities and normal LV systolic function.  Current Outpatient Prescriptions on File Prior to Visit  Medication Sig Dispense Refill  . amLODipine (NORVASC) 10 MG tablet Take 10 mg by mouth daily.        . calcitRIOL (ROCALTROL) 0.5 MCG capsule Take 0.5 mcg by mouth daily.        . cloNIDine (CATAPRES) 0.3 MG tablet Take 0.3 mg by mouth 2 (two) times daily.        . metoprolol (LOPRESSOR) 50 MG tablet Take 50 mg by mouth daily.       . multivitamin (RENA-VIT) TABS tablet Take 1 capsule by mouth Daily.      Marland Kitchen omeprazole (PRILOSEC) 40 MG capsule Take 40 mg by mouth daily.       Marland Kitchen oxyCODONE-acetaminophen (PERCOCET) 5-325 MG per tablet Take 1 tablet by mouth Once daily as needed. For pain      . SENSIPAR 30 MG tablet Take 30 mg by mouth Daily.       Marland Kitchen triamterene-hydrochlorothiazide (DYAZIDE) 37.5-25 MG per capsule Take 1 capsule by mouth every morning.         Allergies  Allergen Reactions  . Penicillins     REACTION: Rash and itching     Past Medical History  Diagnosis Date  . Hypertension   . Chronic Kane disease, stage 5,  Kane failure     on dialysis T/TH/Sat; ESRD as of 05/2011; leg cramps; hypokalemia; microcytosis  . Obstructive sleep apnea   . Obesity   . Gout   . Gastroesophageal reflux disease   . Erectile dysfunction   . Prostatic hypertrophy, benign   . Allergic rhinitis     Past Surgical History  Procedure Date  . Total hip arthroplasty     left  . Portacath placement   . Cholecystectomy   . Finger amputation     right middle  . Colonoscopy 07/12/2011    Procedure: COLONOSCOPY;  Surgeon: Arlyce Harman, MD;  Location: [facility];  Service: Endoscopy;  Laterality: N/A;  1:00  . Av fistula placement 05-31-11    Right radiocephalic AVF    Family History  Problem Relation Age of Onset  . Lung cancer Father 45  . Liver cancer Father 41  . Heart attack Mother 18  . Cancer Brother     unk    History   Social History  . Marital Status: Divorced    Spouse Name: N/A    Number of Children: 3  . Years of Education: N/A   Occupational History  . disabled    Social History Main Topics  . Smoking status: Never Smoker   .  Smokeless tobacco: Never Used  . Alcohol Use: No  . Drug Use: No  . Sexually Active: Not on file   Other Topics Concern  . Not on file   Social History Narrative   Lives alone    ROS: Denies chest pain, dyspnea, orthopnea, PND, palpitations, lightheadedness or syncope.  Reports constipation and pruritus.   All other systems reviewed and are negative.  PHYSICAL EXAM: BP 158/93  Pulse 60  Resp 17  Ht 6' (1.829 m)  Wt 104.781 kg (231 lb)  BMI 31.33 kg/m2  General-Well-developed; no acute distress Body Habitus-moderately overweight HEENT-/AT; PERRL; EOM intact; conjunctiva and lids nl Neck-No JVD; no carotid bruits Endocrine-No thyromegaly Lungs-Clear lung fields; resonant percussion; normal I-to-E ratio; median sternotomy scar Cardiovascular- normal PMI; normal S1 and S2; grade 1-2/6 early systolic murmur at the left sternal border and  apex Abdomen-BS normal; soft and non-tender without masses or organomegaly; midline infraumbilical scar Musculoskeletal-No deformities, cyanosis or clubbing Neurologic-Nl cranial nerves; symmetric strength and tone Skin- Warm, no significant lesions Extremities-Nl distal pulses; no edema; multiple surgical scars over the dorsum of the right foot  EKG: Tracing performed 05/09/11 obtained and reviewed: Normal sinus rhythm with a single PVC, delayed R-wave progression, otherwise normal.  ASSESSMENT AND PLAN:  Matthew Bing, MD 12/11/2011 1:28 PM

## 2011-12-11 NOTE — Assessment & Plan Note (Signed)
Patient appears to be a very good candidate for possible renal transplantation.  It is not clear to me why his previous stress test was inadequate or what will be accomplished by a repeat study.  Case discussed with Dr. Loleta Chance who is unaware of the problems that occurred during evaluation at [facility].  Records will be obtained in an attempt to determine how I can assist with his preoperative evaluation.

## 2011-12-11 NOTE — Assessment & Plan Note (Signed)
Blood pressure control has been fairly good of late.  Dyazide is unlikely to be providing any benefit and can be discontinued.

## 2011-12-12 MED ORDER — VANCOMYCIN HCL 1000 MG IV SOLR
1500.0000 mg | INTRAVENOUS | Status: AC
  Administered 2011-12-13: 1500 mg via INTRAVENOUS
  Filled 2011-12-12: qty 1500

## 2011-12-13 ENCOUNTER — Ambulatory Visit (HOSPITAL_COMMUNITY)
Admission: RE | Admit: 2011-12-13 | Discharge: 2011-12-13 | Disposition: A | Discharge status: Home / Self Care | Payer: Medicare HMO | Source: Ambulatory Visit | Attending: Vascular Surgery | Admitting: Vascular Surgery

## 2011-12-13 ENCOUNTER — Telehealth: Payer: Self-pay | Admitting: Vascular Surgery

## 2011-12-13 ENCOUNTER — Encounter (HOSPITAL_COMMUNITY): Payer: Self-pay | Admitting: Anesthesiology

## 2011-12-13 ENCOUNTER — Ambulatory Visit (HOSPITAL_COMMUNITY): Payer: Medicare HMO | Admitting: Anesthesiology

## 2011-12-13 ENCOUNTER — Encounter (HOSPITAL_COMMUNITY)
Admission: RE | Disposition: A | Discharge status: Home / Self Care | Payer: Self-pay | Source: Ambulatory Visit | Attending: Vascular Surgery

## 2011-12-13 DIAGNOSIS — N185 Chronic kidney disease, stage 5: Secondary | ICD-10-CM

## 2011-12-13 DIAGNOSIS — K219 Gastro-esophageal reflux disease without esophagitis: Secondary | ICD-10-CM | POA: Insufficient documentation

## 2011-12-13 DIAGNOSIS — Y849 Medical procedure, unspecified as the cause of abnormal reaction of the patient, or of later complication, without mention of misadventure at the time of the procedure: Secondary | ICD-10-CM | POA: Insufficient documentation

## 2011-12-13 DIAGNOSIS — T82898A Other specified complication of vascular prosthetic devices, implants and grafts, initial encounter: Secondary | ICD-10-CM | POA: Insufficient documentation

## 2011-12-13 DIAGNOSIS — Z992 Dependence on renal dialysis: Secondary | ICD-10-CM | POA: Insufficient documentation

## 2011-12-13 DIAGNOSIS — N189 Chronic kidney disease, unspecified: Secondary | ICD-10-CM | POA: Insufficient documentation

## 2011-12-13 DIAGNOSIS — G4733 Obstructive sleep apnea (adult) (pediatric): Secondary | ICD-10-CM | POA: Insufficient documentation

## 2011-12-13 DIAGNOSIS — I129 Hypertensive chronic kidney disease with stage 1 through stage 4 chronic kidney disease, or unspecified chronic kidney disease: Secondary | ICD-10-CM | POA: Insufficient documentation

## 2011-12-13 LAB — SURGICAL PCR SCREEN
MRSA, PCR: NEGATIVE
Staphylococcus aureus: NEGATIVE

## 2011-12-13 LAB — POCT I-STAT 4, (NA,K, GLUC, HGB,HCT)
Glucose, Bld: 89 mg/dL (ref 70–99)
HCT: 36 % — ABNORMAL LOW (ref 39.0–52.0)
Hemoglobin: 12.2 g/dL — ABNORMAL LOW (ref 13.0–17.0)
Potassium: 3.6 mEq/L (ref 3.5–5.1)

## 2011-12-13 SURGERY — LIGATION OF COMPETING BRANCHES OF ARTERIOVENOUS FISTULA
Anesthesia: General | Site: Arm Lower | Laterality: Left | Wound class: Clean

## 2011-12-13 MED ORDER — ONDANSETRON HCL 4 MG/2ML IJ SOLN: 4.0000 mg | Freq: Once | INTRAMUSCULAR | Status: DC | PRN

## 2011-12-13 MED ORDER — FENTANYL CITRATE 0.05 MG/ML IJ SOLN
INTRAMUSCULAR | Status: DC | PRN
  Administered 2011-12-13 (×2): 50 ug via INTRAVENOUS
  Administered 2011-12-13: 25 ug via INTRAVENOUS
  Administered 2011-12-13: 50 ug via INTRAVENOUS
  Administered 2011-12-13: 25 ug via INTRAVENOUS

## 2011-12-13 MED ORDER — MUPIROCIN 2 % EX OINT
TOPICAL_OINTMENT | Freq: Once | CUTANEOUS | Status: DC
  Filled 2011-12-13: qty 22

## 2011-12-13 MED ORDER — PROPOFOL 10 MG/ML IV BOLUS
INTRAVENOUS | Status: DC | PRN
  Administered 2011-12-13: 200 mg via INTRAVENOUS

## 2011-12-13 MED ORDER — MUPIROCIN 2 % EX OINT
TOPICAL_OINTMENT | CUTANEOUS | Status: AC
  Filled 2011-12-13: qty 22

## 2011-12-13 MED ORDER — HYDROMORPHONE HCL PF 1 MG/ML IJ SOLN
0.2500 mg | INTRAMUSCULAR | Status: DC | PRN
  Administered 2011-12-13: 0.25 mg via INTRAVENOUS

## 2011-12-13 MED ORDER — SODIUM CHLORIDE 0.9 % IV SOLN
INTRAVENOUS | Status: DC | PRN
  Administered 2011-12-13: 08:00:00 via INTRAVENOUS

## 2011-12-13 MED ORDER — ONDANSETRON HCL 4 MG/2ML IJ SOLN
INTRAMUSCULAR | Status: DC | PRN
  Administered 2011-12-13: 4 mg via INTRAVENOUS

## 2011-12-13 MED ORDER — ALBUTEROL SULFATE HFA 108 (90 BASE) MCG/ACT IN AERS
INHALATION_SPRAY | RESPIRATORY_TRACT | Status: DC | PRN
  Administered 2011-12-13 (×2): 2 via RESPIRATORY_TRACT

## 2011-12-13 MED ORDER — SODIUM CHLORIDE 0.9 % IR SOLN
Status: DC | PRN
  Administered 2011-12-13: 09:00:00

## 2011-12-13 MED ORDER — OXYCODONE HCL 10 MG PO TB12: 5.0000 mg | ORAL_TABLET | Freq: Once | ORAL | Status: DC

## 2011-12-13 MED ORDER — MIDAZOLAM HCL 5 MG/5ML IJ SOLN
INTRAMUSCULAR | Status: DC | PRN
  Administered 2011-12-13: 2 mg via INTRAVENOUS

## 2011-12-13 MED ORDER — LIDOCAINE HCL (CARDIAC) 20 MG/ML IV SOLN
INTRAVENOUS | Status: DC | PRN
  Administered 2011-12-13: 50 mg via INTRAVENOUS

## 2011-12-13 MED ORDER — SODIUM CHLORIDE 0.9 % IV SOLN: INTRAVENOUS | Status: DC

## 2011-12-13 MED ORDER — HYDROMORPHONE HCL PF 1 MG/ML IJ SOLN: 0.2500 mg | INTRAMUSCULAR | Status: DC | PRN

## 2011-12-13 MED ORDER — OXYCODONE HCL 5 MG PO TABS: 5.0000 mg | ORAL_TABLET | Freq: Four times a day (QID) | ORAL | Status: AC | PRN

## 2011-12-13 MED ORDER — OXYCODONE HCL 5 MG PO TABS
5.0000 mg | ORAL_TABLET | Freq: Once | ORAL | Status: AC
  Administered 2011-12-13: 5 mg via ORAL

## 2011-12-13 MED ORDER — 0.9 % SODIUM CHLORIDE (POUR BTL) OPTIME
TOPICAL | Status: DC | PRN
  Administered 2011-12-13: 1000 mL

## 2011-12-13 SURGICAL SUPPLY — 40 items
ADH SKN CLS APL DERMABOND .7 (GAUZE/BANDAGES/DRESSINGS) ×1
CANISTER SUCTION 2500CC (MISCELLANEOUS) ×2 IMPLANT
CLIP TI MEDIUM 6 (CLIP) ×1 IMPLANT
CLIP TI WIDE RED SMALL 6 (CLIP) ×3 IMPLANT
CLOTH BEACON ORANGE TIMEOUT ST (SAFETY) ×2 IMPLANT
COVER PROBE W GEL 5X96 (DRAPES) ×1 IMPLANT
COVER SURGICAL LIGHT HANDLE (MISCELLANEOUS) ×4 IMPLANT
DERMABOND ADVANCED (GAUZE/BANDAGES/DRESSINGS) ×1
DERMABOND ADVANCED .7 DNX12 (GAUZE/BANDAGES/DRESSINGS) ×1 IMPLANT
ELECT REM PT RETURN 9FT ADLT (ELECTROSURGICAL) ×2
ELECTRODE REM PT RTRN 9FT ADLT (ELECTROSURGICAL) ×1 IMPLANT
GEL ULTRASOUND 20GR AQUASONIC (MISCELLANEOUS) ×2 IMPLANT
GLOVE BIO SURGEON STRL SZ 6.5 (GLOVE) ×1 IMPLANT
GLOVE BIO SURGEON STRL SZ7.5 (GLOVE) ×2 IMPLANT
GLOVE BIOGEL PI IND STRL 7.0 (GLOVE) IMPLANT
GLOVE BIOGEL PI INDICATOR 7.0 (GLOVE) ×1
GLOVE EXAM NITRILE MD LF STRL (GLOVE) ×1 IMPLANT
GLOVE SS BIOGEL STRL SZ 7 (GLOVE) IMPLANT
GLOVE SUPERSENSE BIOGEL SZ 7 (GLOVE) ×1
GOWN PREVENTION PLUS XLARGE (GOWN DISPOSABLE) ×2 IMPLANT
GOWN STRL NON-REIN LRG LVL3 (GOWN DISPOSABLE) ×4 IMPLANT
KIT BASIN OR (CUSTOM PROCEDURE TRAY) ×2 IMPLANT
KIT ROOM TURNOVER OR (KITS) ×2 IMPLANT
LOOP VESSEL MINI RED (MISCELLANEOUS) IMPLANT
MARKER SKIN DUAL TIP RULER LAB (MISCELLANEOUS) ×1 IMPLANT
NS IRRIG 1000ML POUR BTL (IV SOLUTION) ×2 IMPLANT
PACK CV ACCESS (CUSTOM PROCEDURE TRAY) ×2 IMPLANT
PAD ARMBOARD 7.5X6 YLW CONV (MISCELLANEOUS) ×4 IMPLANT
SPONGE SURGIFOAM ABS GEL 100 (HEMOSTASIS) IMPLANT
SUT PROLENE 6 0 CC (SUTURE) IMPLANT
SUT SILK 0 (SUTURE) IMPLANT
SUT SILK 3 0 (SUTURE) ×2
SUT SILK 3-0 18XBRD TIE 12 (SUTURE) IMPLANT
SUT VIC AB 3-0 SH 27 (SUTURE) ×2
SUT VIC AB 3-0 SH 27X BRD (SUTURE) ×1 IMPLANT
SUT VICRYL 4-0 PS2 18IN ABS (SUTURE) ×4 IMPLANT
TOWEL OR 17X24 6PK STRL BLUE (TOWEL DISPOSABLE) ×2 IMPLANT
TOWEL OR 17X26 10 PK STRL BLUE (TOWEL DISPOSABLE) ×2 IMPLANT
UNDERPAD 30X30 INCONTINENT (UNDERPADS AND DIAPERS) ×2 IMPLANT
WATER STERILE IRR 1000ML POUR (IV SOLUTION) ×2 IMPLANT

## 2011-12-13 NOTE — Anesthesia Postprocedure Evaluation (Signed)
  Anesthesia Post-op Note  Patient: Matthew Kane  Procedure(s) Performed: Procedure(s) (LRB): LIGATION OF COMPETING BRANCHES OF ARTERIOVENOUS FISTULA (Left)  Patient Location: PACU  Anesthesia Type: General  Level of Consciousness: awake, alert  and oriented  Airway and Oxygen Therapy: Patient Spontanous Breathing and Patient connected to nasal cannula oxygen  Post-op Pain: mild  Post-op Assessment: Post-op Vital signs reviewed and Patient's Cardiovascular Status Stable  Post-op Vital Signs: stable  Complications: No apparent anesthesia complications 

## 2011-12-13 NOTE — Transfer of Care (Signed)
Immediate Anesthesia Transfer of Care Note  Patient: Matthew Kane  Procedure(s) Performed: Procedure(s) (LRB): LIGATION OF COMPETING BRANCHES OF ARTERIOVENOUS FISTULA (Left)  Patient Location: PACU  Anesthesia Type: General  Level of Consciousness: awake, alert  and oriented  Airway & Oxygen Therapy: Patient Spontanous Breathing and Patient connected to nasal cannula oxygen  Post-op Assessment: Report given to PACU RN, Post -op Vital signs reviewed and stable and Patient moving all extremities  Post vital signs: Reviewed and stable  Complications: No apparent anesthesia complications

## 2011-12-13 NOTE — Telephone Encounter (Addendum)
Message copied by Rosalyn Charters on Wed Dec 13, 2011 11:55 AM ------      Message from: Fabienne Bruns E      Created: Wed Dec 13, 2011 11:03 AM       Ultrasound guided side branches ligation      Collins asst      Needs follow up in 2 weeks            Leonette Most  L/V/M FOR PT. INFORMING HIM OF HIS FU APPT. 12-28-11 8:30 AM

## 2011-12-13 NOTE — Op Note (Signed)
Procedure: Left Radial Cephalic AV Fistula side branch ligation, ultrasound guidance PreOp: Poorly functioning AVF PostOp: same Anesthesia: General  Findings: 8 large side branches ligated, diameter of main fistula 4-5 mm  Operative details: After obtaining informed consent, the patient was taken to the operating room. The patient was placed in supine position on the operating table. After adequate sedation, the patient's entire left upper extremity was prepped and draped in the usual sterile fashion. Jltrasound was used to idenitify all side branches of the fistula and these were marked.  A longitudinal incision was made over each side branch. The incision was carried onto the subcutaneous tissues down to level of the pre-existing AV fistula. The fistula was patent and did have a thrill within it. The fistula was dissected free circumferentially in each incision and all side branches identified and ligated and divided between silk ties.  The main fistula was 4-5 mm diameter.  The skin of each incision was closed with a 4  0 Vicryl subcuticular stitch. The patient tolerated the procedure well and there were no complications. Instrument sponge and needle counts were correct at the end of the case. The patient was taken to the recovery room in stable condition.  Fabienne Bruns, MD [facility] [place] Office: [phone] Pager: [phone]

## 2011-12-13 NOTE — Anesthesia Postprocedure Evaluation (Signed)
  Anesthesia Post-op Note  Patient: Matthew Kane  Procedure(s) Performed: Procedure(s) (LRB): LIGATION OF COMPETING BRANCHES OF ARTERIOVENOUS FISTULA (Left)  Patient Location: PACU  Anesthesia Type: General  Level of Consciousness: awake, alert  and oriented  Airway and Oxygen Therapy: Patient Spontanous Breathing and Patient connected to nasal cannula oxygen  Post-op Pain: mild  Post-op Assessment: Post-op Vital signs reviewed and Patient's Cardiovascular Status Stable  Post-op Vital Signs: stable  Complications: No apparent anesthesia complications

## 2011-12-13 NOTE — Anesthesia Procedure Notes (Addendum)
Procedure Name: LMA Insertion Date/Time: 12/13/2011 8:42 AM Performed by: Elizbeth Squires R Pre-anesthesia Checklist: Patient identified, Emergency Drugs available, Suction available and Patient being monitored Patient Re-evaluated:Patient Re-evaluated prior to inductionOxygen Delivery Method: Circle system utilized Preoxygenation: Pre-oxygenation with 100% oxygen Intubation Type: IV induction LMA: LMA inserted LMA Size: 5.0 Tube type: Oral Placement Confirmation: positive ETCO2 and breath sounds checked- equal and bilateral Tube secured with: Tape Dental Injury: Teeth and Oropharynx as per pre-operative assessment  Comments: Pt with spasm after LMA insertion. Positive pressure held and Sevo turned on.  Spasm release. Breath sounds clear bilaterally per Dr. Noreene Larsson.

## 2011-12-13 NOTE — Progress Notes (Signed)
Providing lunch relief 

## 2011-12-13 NOTE — H&P (Signed)
[facility] [place]   Date of Surgery: 05/31/11 left Radiocephalic AVF  HD Center: [facility] HD T , [place], Sat.  HPI:  Matthew Kane is a 61 y.o. male who is 7 months S/P creation/revision of left upper extremity Hemodialysis access. The patient denies symptoms of numbness, tingling, weakness and denies pain in the operative limb. Patient is here for post -op evaluation to assess maturation of left Cimino Fistula. Pt states the dialysis center has been able to cannulate fistula but he was referred by Dr. Allena Katz for difficult cannulation.  Recent fistulogram showed several large side branches.  Past Medical History   Diagnosis  Date   .  Hypertension    .  CKD (chronic kidney disease)      on dialysis T/TH/Sat   .  OSA (obstructive sleep apnea)    .  Obesity    .  Gout    .  Leg cramps    .  GERD (gastroesophageal reflux disease)    .  ED (erectile dysfunction)    .  Prostatic hypertrophy, benign    .  Sleep apnea    .  Allergic rhinitis    .  Renal disorder     History    Social History   .  Marital Status:  Divorced     Spouse Name:  N/A     Number of Children:  3   .  Years of Education:  N/A    Occupational History   .  disabled     Social History Main Topics   .  Smoking status:  Never Smoker   .  Smokeless tobacco:  Never Used   .  Alcohol Use:  No   .  Drug Use:  No   .  Sexually Active:  Not on file    Other Topics  Concern   .  Not on file    Social History Narrative    Lives alone    Medication List  As of 12/13/2011  7:51 AM   ASK your doctor about these medications         amLODipine 10 MG tablet   Commonly known as: NORVASC   Take 10 mg by mouth daily.      calcitRIOL 0.5 MCG capsule   Commonly known as: ROCALTROL   Take 0.5 mcg by mouth daily.      cloNIDine 0.3 MG tablet   Commonly known as: CATAPRES   Take 0.3 mg by mouth 2 (two) times daily.      metoprolol 50 MG tablet   Commonly known as: LOPRESSOR   Take 50 mg by  mouth daily.      multivitamin Tabs tablet   Take 1 capsule by mouth Daily.      omeprazole 40 MG capsule   Commonly known as: PRILOSEC   Take 40 mg by mouth daily.      oxyCODONE-acetaminophen 5-325 MG per tablet   Commonly known as: PERCOCET   Take 1 tablet by mouth Once daily as needed. For pain      SENSIPAR 30 MG tablet   Generic drug: cinacalcet   Take 30 mg by mouth Daily.      triamterene-hydrochlorothiazide 37.5-25 MG per capsule   Commonly known as: DYAZIDE   Take 1 capsule by mouth every morning.           ROS - reviewed ESRD ,denies DOE, Chest pain. unchanged from previous surgery in October  Physical Examination  Blood pressure 168/95, pulse 60, temperature 98 F (36.7 C), temperature source Oral, resp. rate 20, SpO2 95.00%. WDWN male in NAD.  left upper extremity Incision is healed  Skin color is normal  Hand grip is 5/5 and sensation in digits is intact; There is a good thrill and good bruit in the AVF.  The graft/fistula is fairlyeasily palpable and of adequate size  he has a 2+ radial pulse, hand is warm and pink   Duplex of the AVF shows velocity of 1028 at the anastomotic area. It is > than 6mm in size and has multiple branches - one of which is 5 mm   Assessment/Plan  Matthew Kane is a 61 y.o. year old who is s/p creation of left upper extremity Hemodialysis access that is difficult to cannulate but has adequate flow during dialysis. Fistulogram shows patent arterial anastomosis with several side branches  Side branch ligation left AVF today.  Fabienne Bruns, MD [facility] [place] Office: [phone] Pager: [phone]

## 2011-12-13 NOTE — Anesthesia Preprocedure Evaluation (Addendum)
Anesthesia Evaluation  Patient identified by MRN, date of birth, ID band Patient awake    Reviewed: Allergy & Precautions, H&P , NPO status , Patient's Chart, lab work & pertinent test results, reviewed documented beta blocker date and time   Airway Mallampati: III TM Distance: >3 FB Neck ROM: Full    Dental  (+) Teeth Intact and Dental Advisory Given   Pulmonary sleep apnea ,  breath sounds clear to auscultation        Cardiovascular hypertension, Pt. on medications Rhythm:Regular Rate:Normal     Neuro/Psych PSYCHIATRIC DISORDERS    GI/Hepatic GERD-  Medicated and Controlled,  Endo/Other    Renal/GU ESRF and DialysisRenal disease     Musculoskeletal   Abdominal   Peds  Hematology   Anesthesia Other Findings   Reproductive/Obstetrics                          Anesthesia Physical Anesthesia Plan  ASA: III  Anesthesia Plan: General   Post-op Pain Management:    Induction: Intravenous  Airway Management Planned: LMA  Additional Equipment:   Intra-op Plan:   Post-operative Plan:   Informed Consent: I have reviewed the patients History and Physical, chart, labs and discussed the procedure including the risks, benefits and alternatives for the proposed anesthesia with the patient or authorized representative who has indicated his/her understanding and acceptance.   Dental advisory given  Plan Discussed with: CRNA and Surgeon  Anesthesia Plan Comments: (ESRD K-3.6 Htn H/O Colon Ca  Plan GA with LMA  Kipp Brood, MD)       Anesthesia Quick Evaluation

## 2011-12-14 NOTE — Anesthesia Postprocedure Evaluation (Signed)
  Anesthesia Post-op Note  Patient: Matthew Kane  Procedure(s) Performed: Procedure(s) (LRB): LIGATION OF COMPETING BRANCHES OF ARTERIOVENOUS FISTULA (Left)  Patient Location: PACU  Anesthesia Type: General  Level of Consciousness: awake, alert  and oriented  Airway and Oxygen Therapy: Patient Spontanous Breathing and Patient connected to nasal cannula oxygen  Post-op Pain: mild  Post-op Assessment: Post-op Vital signs reviewed and Patient's Cardiovascular Status Stable  Post-op Vital Signs: stable  Complications: No apparent anesthesia complications 

## 2011-12-16 ENCOUNTER — Encounter: Payer: Self-pay | Admitting: Cardiology

## 2011-12-26 ENCOUNTER — Telehealth: Payer: Self-pay | Admitting: Cardiology

## 2011-12-26 NOTE — Telephone Encounter (Signed)
STRESS ECHO RESULTS

## 2011-12-26 NOTE — Telephone Encounter (Signed)
Spoke with patient, who did not have stress echo.  Was a no show for this procedure.  We are awaiting records from [facility], which have been requested x 3.  Will attempt to contact them by phone for follow up.

## 2011-12-27 ENCOUNTER — Encounter: Payer: Self-pay | Admitting: Vascular Surgery

## 2011-12-27 ENCOUNTER — Telehealth: Payer: Self-pay | Admitting: Cardiology

## 2011-12-27 ENCOUNTER — Encounter: Payer: Self-pay | Admitting: Cardiology

## 2011-12-27 DIAGNOSIS — I10 Essential (primary) hypertension: Secondary | ICD-10-CM

## 2011-12-27 NOTE — Telephone Encounter (Signed)
Medical records obtained and reviewed.    An echocardiogram performed 02/14/2011 revealed mild LVH with normal LV systolic function, mild left atrial enlargement, mild left ventricular enlargement and a mildly dilated aortic root.    EKG on 11/24/2010: Sinus bradycardia, sinus arrhythmia, otherwise normal.  Stress echocardiogram on 02/14/2011: Moderately hypertensive response to exercise during isometric exercise of the upper extremities, no stress-induced EKG or echocardiographic abnormalities.  Responsible physician at [facility]-Dr. Lannie Fields.  An effort will be made to contact him to determine whether additional stress testing is requested.

## 2011-12-28 ENCOUNTER — Ambulatory Visit (INDEPENDENT_AMBULATORY_CARE_PROVIDER_SITE_OTHER): Payer: Medicare HMO | Admitting: Vascular Surgery

## 2011-12-28 ENCOUNTER — Ambulatory Visit: Payer: Medicare HMO | Admitting: Vascular Surgery

## 2011-12-28 ENCOUNTER — Encounter: Payer: Self-pay | Admitting: Vascular Surgery

## 2011-12-28 VITALS — BP 182/103 | HR 54 | Temp 97.3°F | Ht 72.0 in | Wt 221.0 lb

## 2011-12-28 DIAGNOSIS — N186 End stage renal disease: Secondary | ICD-10-CM

## 2011-12-28 NOTE — Progress Notes (Signed)
Patient is a 61 year old male who returns today for further followup after branch the side branch ligation of a left radiocephalic AV fistula. He is currently dialyzing via a right-sided catheter. He previously had a right brachiocephalic AV fistula and had problems on the right side with swelling.  Physical exam:  Filed Vitals:   12/28/11 1345  BP: 182/103  Pulse: 54  Temp: 97.3 F (36.3 C)  TempSrc: Oral  Height: 6' (1.829 m)  Weight: 221 lb (100.245 kg)  SpO2: 100%   Healing incisions left forearm,  No audible bruit or thrill  Assessment: Occluded left radiocephalic AV fistula  Plan: Patient needs a few weeks to heal up his left arm. At that time he will return for followup and we will repeat a map his left upper extremity. We will determine at that time whether or not he is a candidate for fistula will not be to place a graft at this time.  Fabienne Bruns, MD [facility] of [place] Office: [phone] Pager: [phone]

## 2012-01-01 ENCOUNTER — Encounter: Payer: Self-pay | Admitting: Cardiology

## 2012-01-03 ENCOUNTER — Other Ambulatory Visit: Payer: Self-pay | Admitting: *Deleted

## 2012-01-03 ENCOUNTER — Encounter: Payer: Self-pay | Admitting: *Deleted

## 2012-01-03 DIAGNOSIS — E782 Mixed hyperlipidemia: Secondary | ICD-10-CM

## 2012-01-05 ENCOUNTER — Ambulatory Visit (HOSPITAL_COMMUNITY): Payer: Medicare HMO

## 2012-01-05 ENCOUNTER — Ambulatory Visit (HOSPITAL_COMMUNITY): Admission: RE | Admit: 2012-01-05 | Payer: Medicare HMO | Source: Ambulatory Visit

## 2012-01-12 LAB — LIPID PANEL
HDL: 46 mg/dL (ref >39)
LDL Cholesterol: 53 mg/dL (ref 0–99)
Total CHOL/HDL Ratio: 2.4 Ratio
Triglycerides: 61 mg/dL (ref <150)
VLDL: 12 mg/dL (ref 0–40)

## 2012-01-14 IMAGING — CR DG CHEST 2V
2 series · 2 of 2 positions shown · non-contrast
Comparison: 09/12/2010; fluoroscopic guided dialysis catheter
placement - earlier same day

CLINICAL DATA: Dialysis catheter placed earlier today, now with
leak

CHEST - 2 VIEW

[view not recorded (1 of 2)]
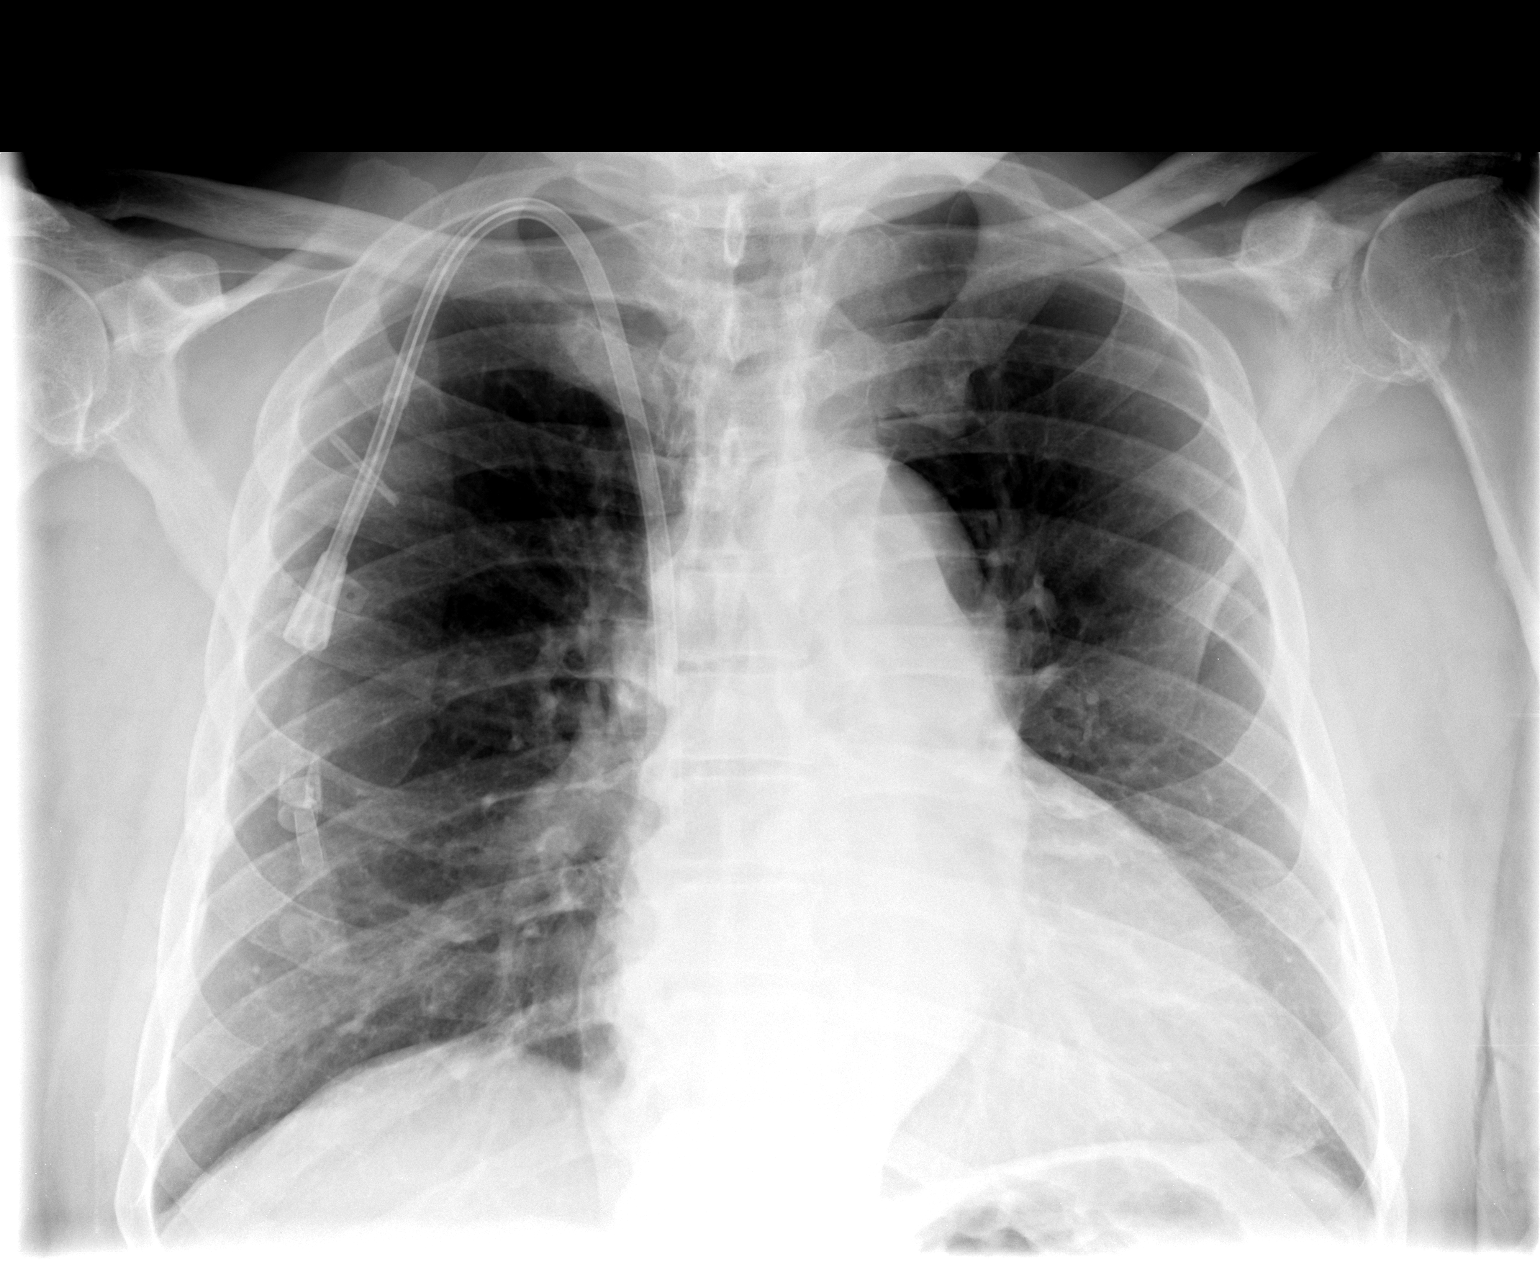

[view not recorded (2 of 2)]
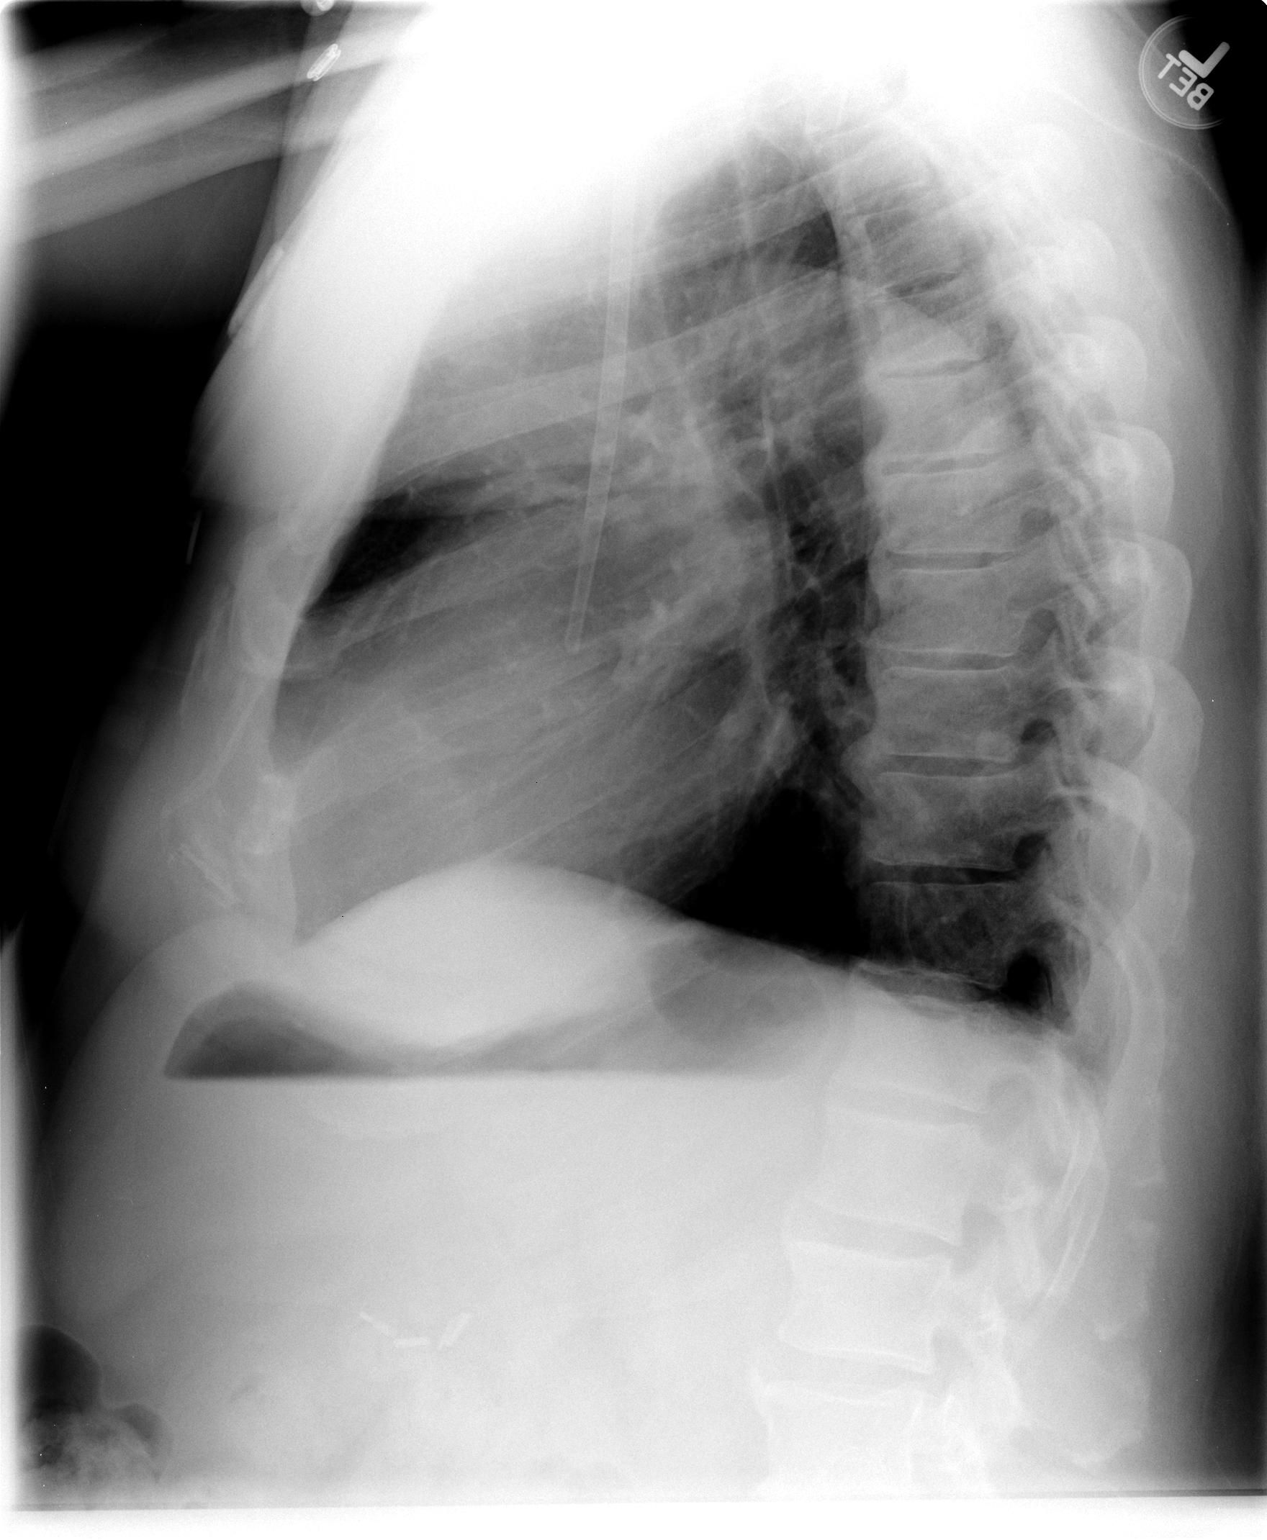

[2 of 2 positions shown; findings below may reference images not displayed]

FINDINGS: Unchanged borderline enlarged cardiac silhouette and mild
tortuosity of the descending thoracic aorta.  Unchanged positioning
of right internal jugular approach dialysis catheter with tips
overlying the superior aspect of the right atrium.  No focal
parenchymal opacities.  There is persistent blunting of the
bilateral costophrenic angles without definite pleural effusion.
No pneumothorax.  Unchanged bones.
IMPRESSION: 1.  Unchanged positioning of the right jugular approach dialysis
catheter with tips overlying the expected location of the superior
aspect of the right atrium.
2.  No acute cardiopulmonary disease.

## 2012-01-14 IMAGING — XA IR FLUORO GUIDE CV LINE*R*
1 series · 2 of 2 positions shown · non-contrast
Comparison: none

Clinical Data/Indication: RENAL FAILURE

TUNNELED DIALYSIS CATHETER PLACEMENT, ULTRASOUND GUIDANCE FOR
VASCULAR ACCESS
Sedation: Versed 2 mg, Fentanyl 100 mcg.
Total Moderate Sedation Time: 15 minutes.
Fluoroscopy Time: 0.6 minutes.
Procedure: The procedure, risks, benefits, and alternatives were
explained to the patient. Questions regarding the procedure were
encouraged and answered. The patient understands and consents to
the procedure.
The right neck was prepped with betadine in a sterile fashion, and
a sterile drape was applied covering the operative field. A sterile
gown and sterile gloves were used for the procedure. 1% lidocaine
into the skin and subcutaneous tissue.  The right internal jugular
vein was noted to be patent initially with ultrasound.  Under
sonographic guidance, a micropuncture needle was inserted into the
right IJ vein (Ultrasound and fluoroscopic image documentation was
performed). It was removed over an 018 wire which was upsized to an
Amplatz.  This was advanced into the IVC.
A small incision was made in the right upper chest.  The tunneling
device was utilized to advance the 23 centimeter tip to cuff
catheter from the chest incision and out the neck incision.  A peel-
away sheath was advanced over the Amplatz wire.  The leading edge
of the catheter was then advanced through the peel-away sheath.
The peel-away sheath was removed.  It was flushed and instilled
with heparin.  The chest incision was closed with a 0 Prolene
pursestring stitch.  The neck incision was closed with a 4-0 Vicryl
subcuticular stitch.  No complications.

[Series 1: run · 2 of 2 slices shown]
[im 1/2]
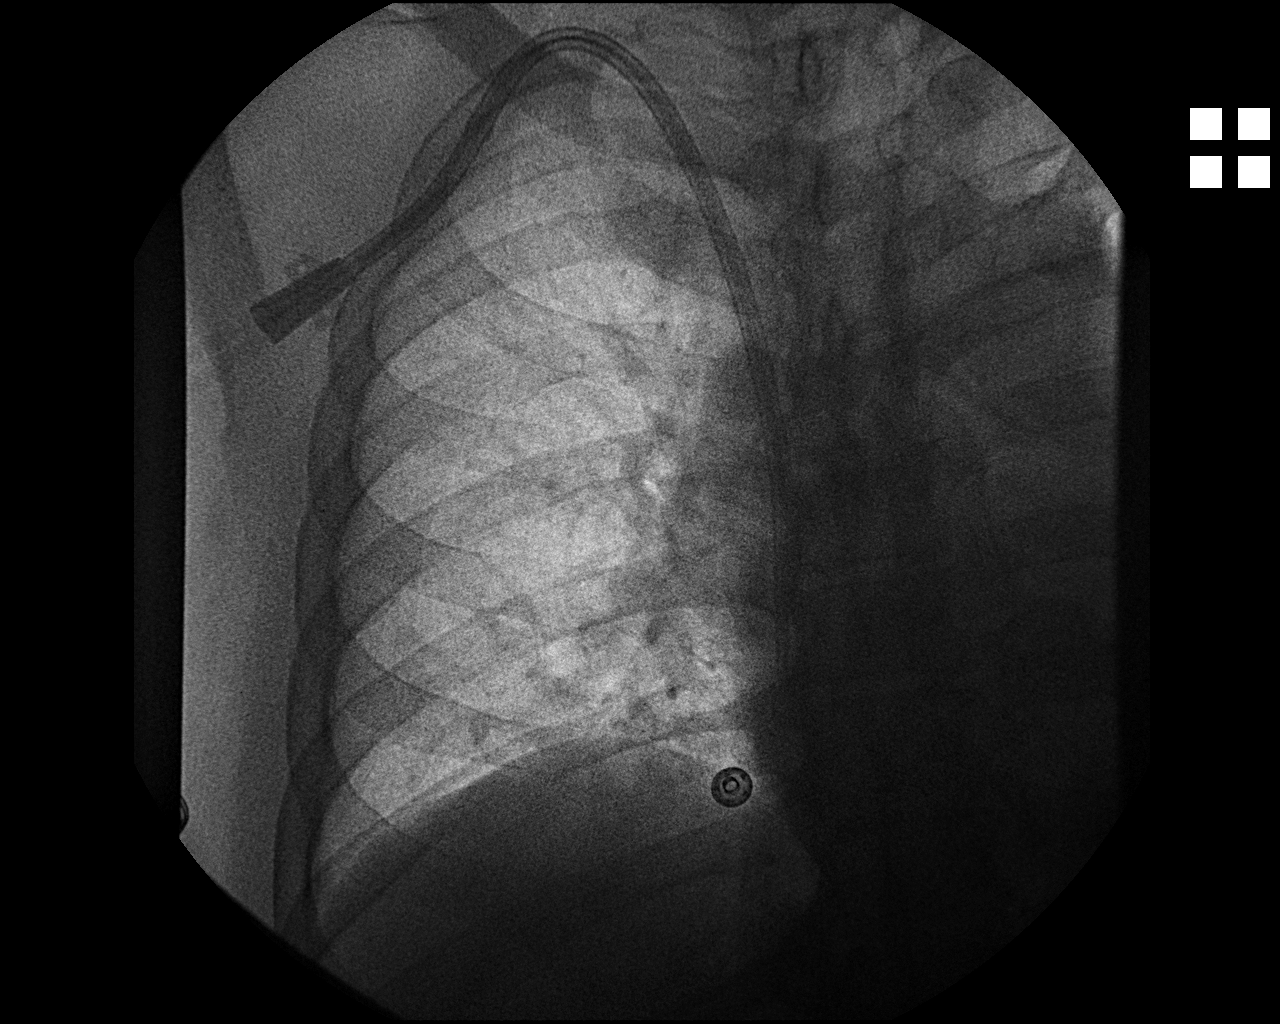
[im 2/2]
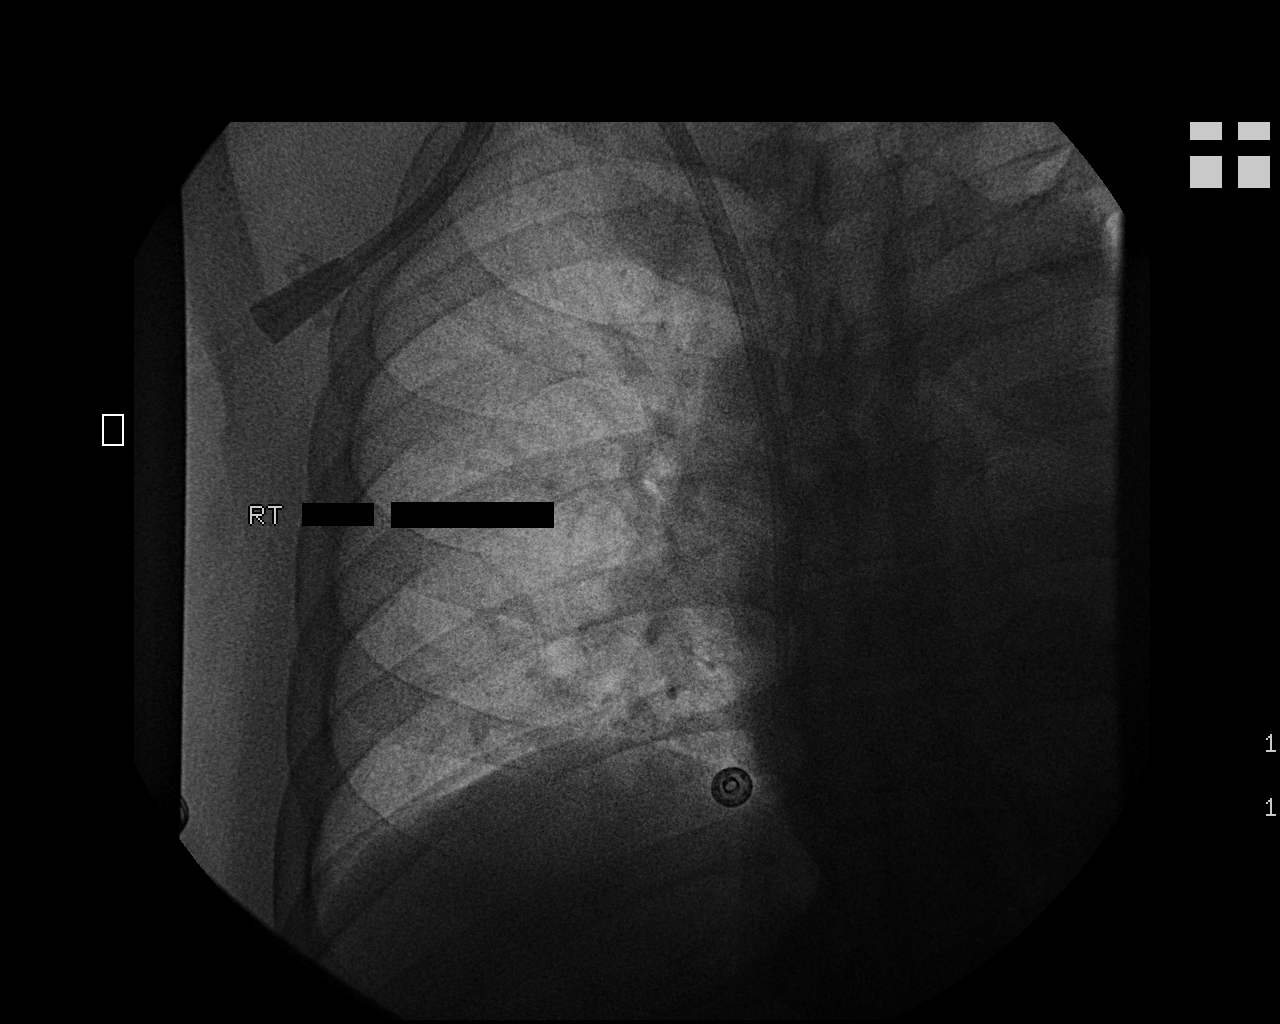

[2 of 2 positions shown; findings below may reference images not displayed]

FINDINGS: The image demonstrates placement of a tunneled dialysis
catheter with its tip in the right atrium.
IMPRESSION: Successful right IJ vein tunneled dialysis catheter with its tip in
the right atrium.

## 2012-01-15 ENCOUNTER — Encounter: Payer: Self-pay | Admitting: *Deleted

## 2012-01-15 IMAGING — US US EXTREM  UP VENOUS*R*
1 series · 13 of 24 positions shown · non-contrast
Comparison: None

CLINICAL DATA: End-stage renal disease on hemodialysis,
hypertension, swelling right upper extremity, failed dialysis
fistula right distal arm, right IJ catheter placement on 05/11/2011

RIGHT UPPER EXTREMITY VENOUS ULTRASOUND

[Series 1: us extrem up venous*right* · 13 of 49 slices shown]
[im 1/49]
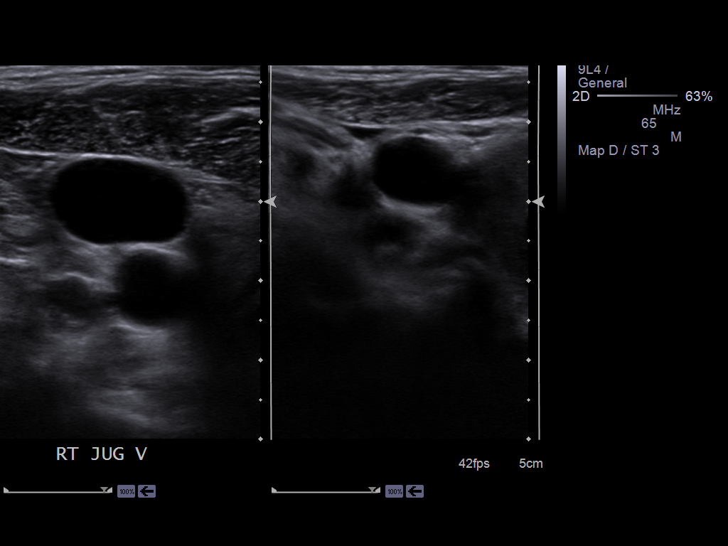
[im 5/49]
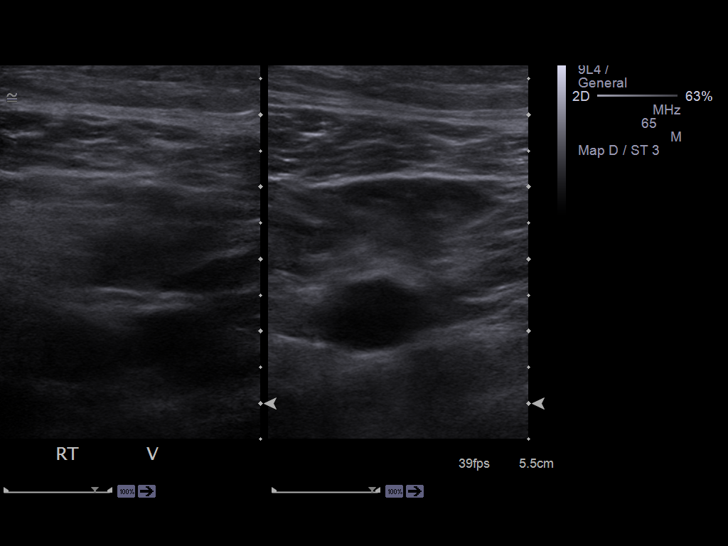
[im 9/49]
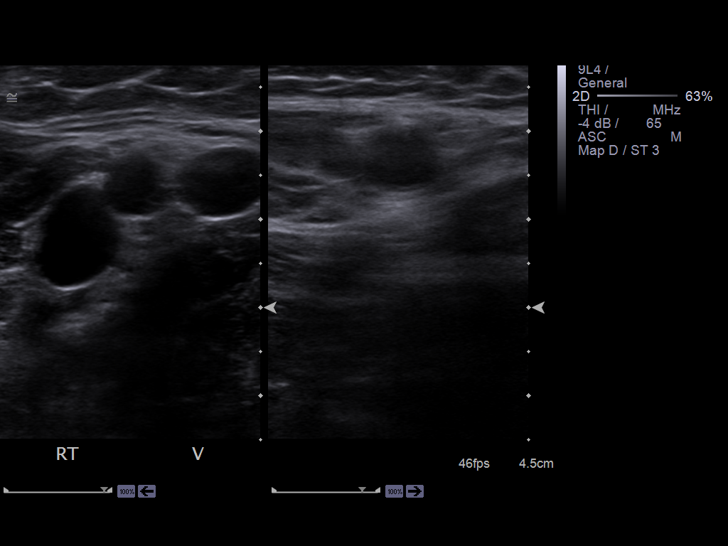
[im 13/49]
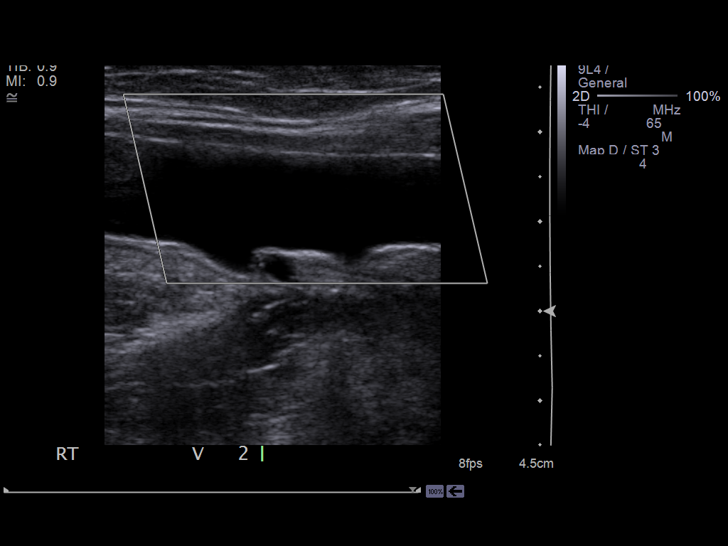
[im 17/49]
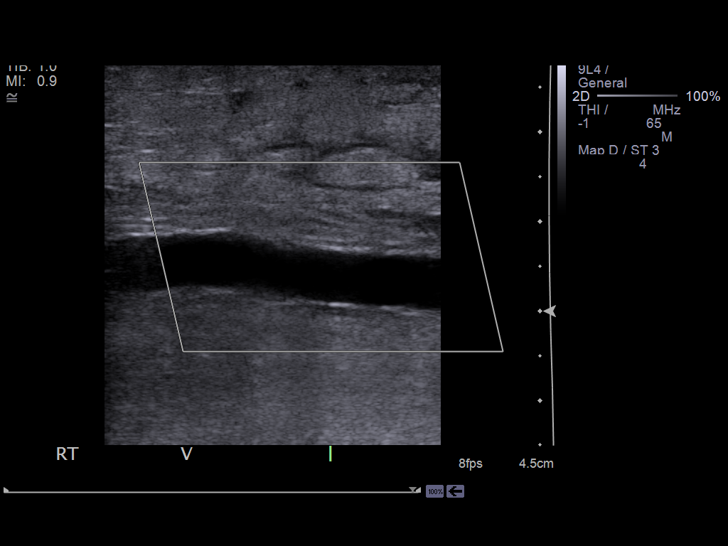
[im 21/49]
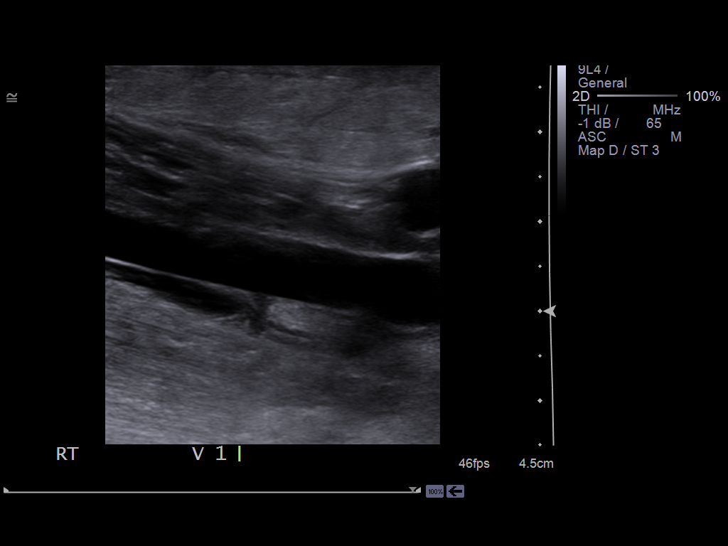
[im 26/49]
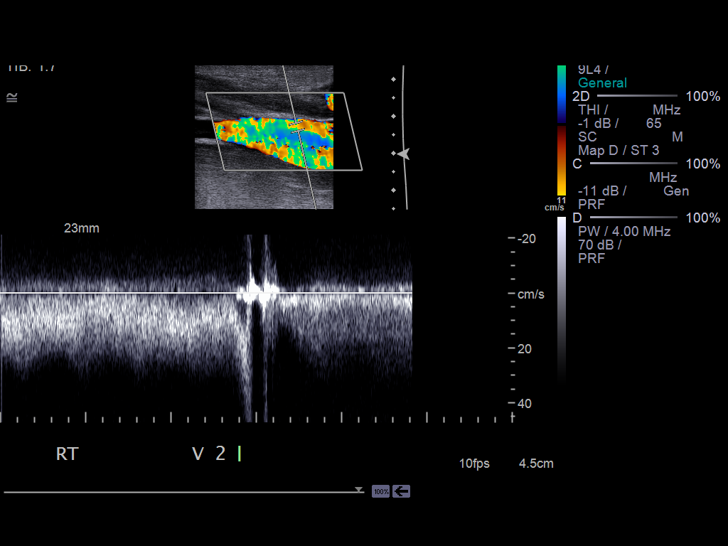
[im 28/49]
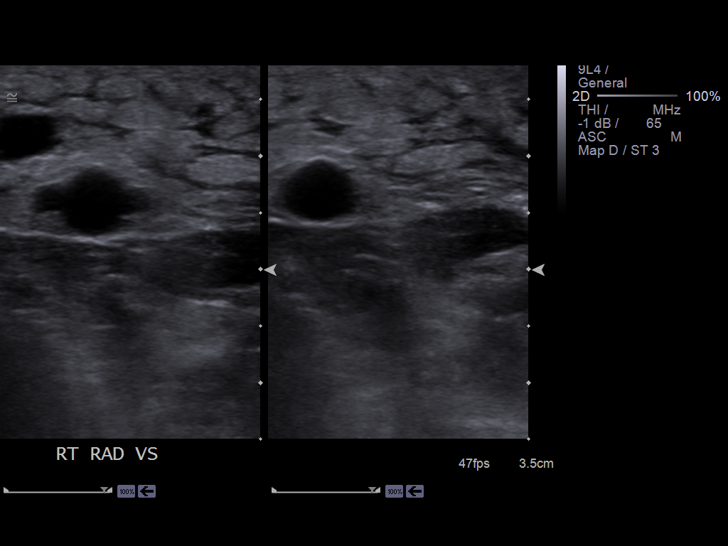
[im 32/49]
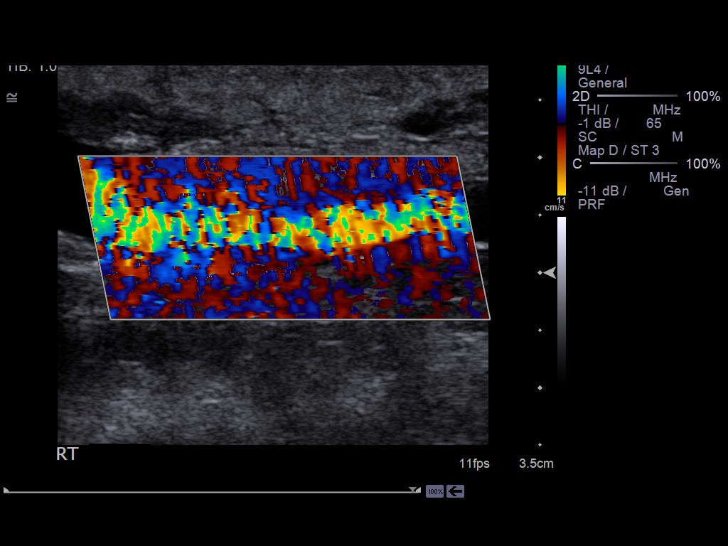
[im 36/49]
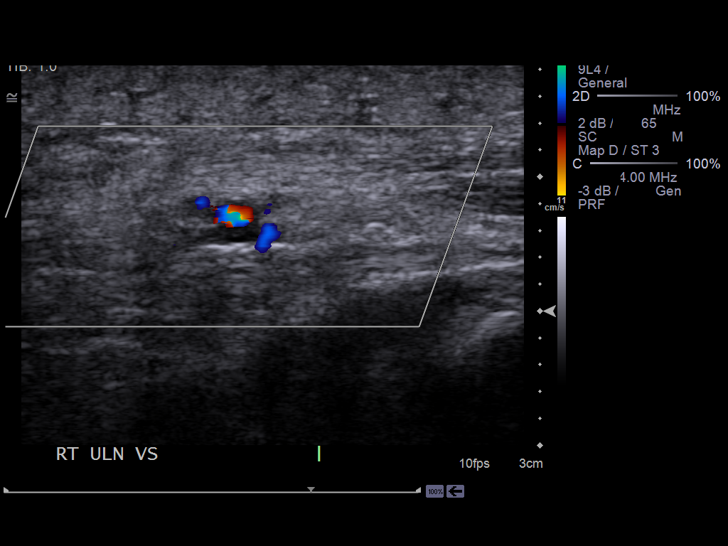
[im 40/49]
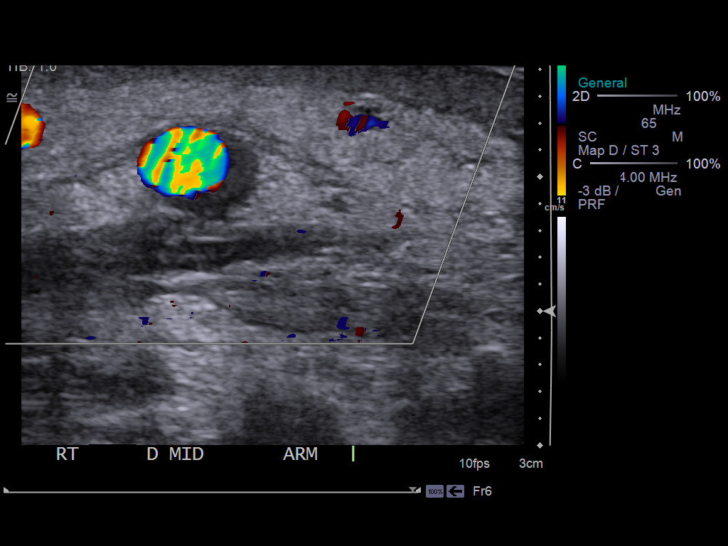
[im 44/49]
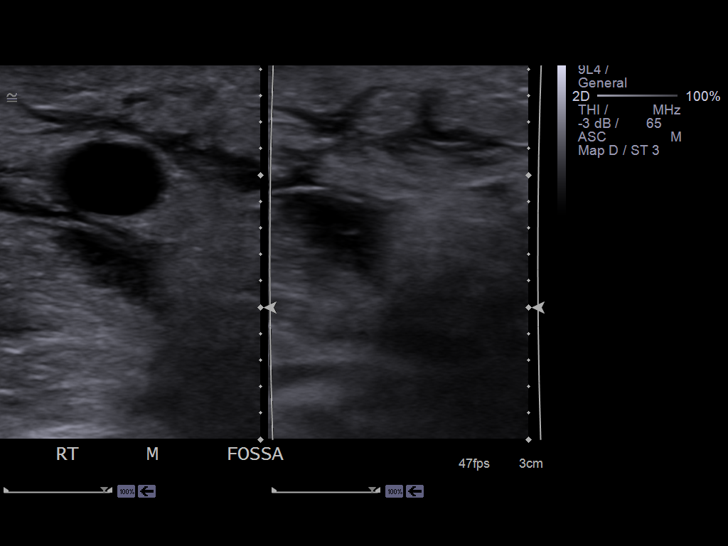
[im 49/49]
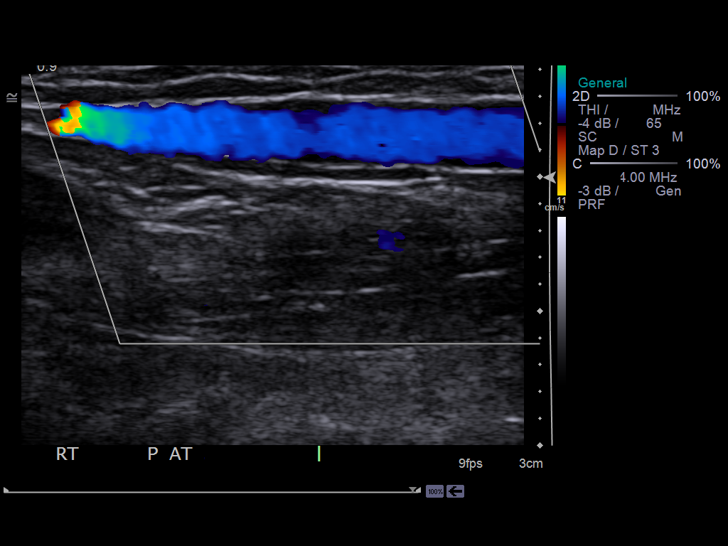

[13 of 24 positions shown; findings below may reference images not displayed]

FINDINGS: Right internal jugular, subclavian, axillary, basilic, brachial,
radial, and ulnar veins appear patent and compressible.
Spontaneous venous flow is present within these venous segments.
No intraluminal thrombus identified within these segments.

A palpable thrill is identified in the right forearm along the
radial border, likely the outflow from dialysis fistula originating
at the right wrist.  I suspect this is a radial artery fistula
anastomosis at the radial border of the wrist.  At this site, at
the junction of the fistula with the right radial artery, high
velocity arterial flow is seen with significant turbulence on color
Doppler imaging and spectral broadening on waveform analysis.  Peak
systolic velocity is 733 cm/sec consistent with a hemodynamically
significant anastomotic stricture.  Additionally, at the mid
forearm portion of the venous outflow from the fistula, a segment
of intraluminal thrombus is identified.  Observed superficial
thrombus is nonocclusive, with a small amount of residual
spontaneous flow and compressibility present.
IMPRESSION: No evidence of deep venous thrombosis in the upper right arm.
AV graft at right wrist with evidence of stricture at the arterial
anastomoses and a segment of superficial thrombus within the venous
outflow from the right forearm fistula.

Findings called to the referring physician's office by the
technologist prior to dictation of this report; patient instructed
to present to [REDACTED] for
further evaluation.

## 2012-01-16 ENCOUNTER — Other Ambulatory Visit (HOSPITAL_COMMUNITY): Payer: Medicare HMO

## 2012-01-17 ENCOUNTER — Other Ambulatory Visit: Payer: Self-pay | Admitting: *Deleted

## 2012-01-17 ENCOUNTER — Encounter: Payer: Self-pay | Admitting: Vascular Surgery

## 2012-01-17 DIAGNOSIS — Z4931 Encounter for adequacy testing for hemodialysis: Secondary | ICD-10-CM

## 2012-01-17 DIAGNOSIS — N186 End stage renal disease: Secondary | ICD-10-CM

## 2012-01-18 ENCOUNTER — Ambulatory Visit (INDEPENDENT_AMBULATORY_CARE_PROVIDER_SITE_OTHER): Payer: Medicare HMO | Admitting: Vascular Surgery

## 2012-01-18 ENCOUNTER — Encounter: Payer: Self-pay | Admitting: Vascular Surgery

## 2012-01-18 ENCOUNTER — Encounter (INDEPENDENT_AMBULATORY_CARE_PROVIDER_SITE_OTHER): Payer: Medicare HMO

## 2012-01-18 VITALS — BP 146/93 | HR 58 | Temp 98.1°F | Ht 72.0 in | Wt 222.0 lb

## 2012-01-18 DIAGNOSIS — Z01810 Encounter for preprocedural cardiovascular examination: Secondary | ICD-10-CM

## 2012-01-18 DIAGNOSIS — N186 End stage renal disease: Secondary | ICD-10-CM

## 2012-01-18 DIAGNOSIS — Z4931 Encounter for adequacy testing for hemodialysis: Secondary | ICD-10-CM

## 2012-01-18 NOTE — Progress Notes (Addendum)
[facility] OF [place] HISTORY AND PHYSICAL   CC:  Needs new access Hill, Annia Friendly, MD  HPI: This is a 61 y.o. male here because he had a left radiocephalic AVF with ligation of side branches that subsequently had to be ligated.  He does dialyze through a right IJ diatek.  His incisions have healed, but do have some spitting stitches.  He is here for new access.  He has a Hx of HTN, ESRD.  States that he has had a DVT, but he is referring to his fistula clotting.  Past Medical History  Diagnosis Date  . Hypertension     Echo in 10/2006-moderate LVH, normal EF  . Chronic kidney disease, stage 5, kidney failure      dialysis at [facility] T/TH/Sat; ESRD as of 05/2011; leg cramps; hypokalemia; microcytosis  . Obstructive sleep apnea 2008    2008  . Obesity   . Gout   . Gastroesophageal reflux disease   . Erectile dysfunction   . Prostatic hypertrophy, benign   . Allergic rhinitis    Past Surgical History  Procedure Date  . Total hip arthroplasty     bilateral  . Portacath placement   . Cholecystectomy   . Finger amputation     right middle  . Colonoscopy 07/12/2011    Procedure: COLONOSCOPY;  Surgeon: Arlyce Harman, MD;  Location: [facility];  Service: Endoscopy;  Laterality: N/A;  1:00  . Av fistula placement 05-31-11    Right radiocephalic AVF  . 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 Foot surgery     Repair of chain saw injury    Allergies  Allergen Reactions  . Penicillins     REACTION: Rash and itching    Current Outpatient Prescriptions  Medication Sig Dispense Refill  . amLODipine (NORVASC) 10 MG tablet Take 10 mg by mouth daily.       Marland Kitchen b complex-vitamin c-folic acid (NEPHRO-VITE) 0.8 MG TABS Take by mouth daily.      . calcitRIOL (ROCALTROL) 0.5 MCG capsule Take 0.5 mcg by mouth daily.        . calcium acetate (PHOSLO) 667 MG capsule Take 667 mg by mouth daily.      . cloNIDine (CATAPRES) 0.3  MG tablet Take 0.3 mg by mouth 2 (two) times daily.       . metoprolol (LOPRESSOR) 50 MG tablet Take 50 mg by mouth daily.       . multivitamin (RENA-VIT) TABS tablet Take 1 capsule by mouth Daily.      Marland Kitchen omeprazole (PRILOSEC) 40 MG capsule Take 40 mg by mouth daily.       Marland Kitchen oxyCODONE (OXY IR/ROXICODONE) 5 MG immediate release tablet Take 5 mg by mouth daily.      . SENSIPAR 30 MG tablet Take 30 mg by mouth Daily.       Marland Kitchen triamterene-hydrochlorothiazide (DYAZIDE) 37.5-25 MG per capsule Take 1 capsule by mouth every morning.        Marland Kitchen oxyCODONE-acetaminophen (PERCOCET) 5-325 MG per tablet Take 1 tablet by mouth Once daily as needed. For pain        Family History  Problem Relation Age of Onset  . Lung cancer Father 66    hepatic metastases  . Heart attack Mother 88  . Cancer Brother     unk    History   Social History  . Marital Status: Divorced  Spouse Name: N/A    Number of Children: 3  . Years of Education: N/A   Occupational History  . Retired     [employer]   Social History Main Topics  . Smoking status: Former Smoker -- 1.0 packs/day for 10 years    Types: Cigarettes    Quit date: 01/11/1975  . Smokeless tobacco: Never Used  . Alcohol Use: No  . Drug Use: No  . Sexually Active: Not on file   Other Topics Concern  . Not on file   Social History Narrative   Lives alone     ROS: [x]  Positive   [ ]  Negative   [ ]  All sytems reviewed and are negative  Cardiovascular: [] chest pain; [] chest pressure; [] palpitations; [] SOB lying flat; [] DOE; [] pain in legs with walking; [] pain in legs when lying flat; [] Hx of DVT; [] Hx phlebitis; [] swelling in legs; [] varicose veins Pulmonary: [] productive cough; [] asthma; [] wheezing Neurologic:  [] Hx CVA;  [] weakness in arms or legs; [] numbness in arms or legs; [] difficulty in speaking or slurred speech; [] temporary loss of vision in one eye; [] dizziness Hematologic:  [] bleeding problems; [] clots easily GI:  [] vomiting blood;  [x]  blood in stool-he states that this is just a speck of blood and he does have hemorrhoids.; [] PUD GU: []  Dysuria; [] hematuria Psychiatric:  [] Hx major depression Integumentary:  [] rashes; [] ulcers Constitutional:  [] fever; [] chills    PHYSICAL EXAMINATION:  Filed Vitals:   01/18/12 1550  BP: 146/93  Pulse: 58  Temp: 98.1 F (36.7 C)   Body mass index is 30.11 kg/(m^2).  General:  WDWN in NAD Gait: Normal HENT: WNL Eyes: PERRL Pulmonary: normal non-labored breathing , without Rales, rhonchi,  wheezing Cardiac: RRR, without  Murmurs, rubs or gallops; No carotid bruits Abdomen: soft, NT, no masses Skin: no rashes, ulcers noted Vascular Exam/Pulses: + palpable radial pulses bilaterally Extremities without ischemic changes, no Gangrene , no cellulitis; no open wounds;  Musculoskeletal: no muscle wasting or atrophy  Neurologic: A&O X 3; Appropriate Affect ; SENSATION: normal; MOTOR FUNCTION:  moving all extremities equally. Speech is fluent/normal  Non-Invasive Vascular Imaging: Duplex ultrasound of the left upper extremity is reviewed today. The fistula is occluded. The basilic vein is marginal with multiple branches. I reviewed and interpreted this study. Darrick Penna)  ASSESSMENT: 61 y.o. male in need of new access.  His veins are not sufficient for a fistula.   PLAN: We will plan for a left arm AVG; depending on how the vein looks, it could be a forearm loop graft or a RUA AVG.  This will be determined at the time of surgery.  Doreatha Massed, PA-C [facility] [phone]  History and exam details as above. The patient is not a candidate for any further fistulas in the left arm. I believe the best option would be to place a left arm AV graft. Risks benefits possible palpitations and procedure details were explained to the patient today. This is scheduled for early July at the patient's request.  Fabienne Bruns, MD [facility]  [place] Office: [phone] Pager: [phone]   Clinic MD:  Seen and examined in conjunction with Dr. Darrick Penna.

## 2012-01-19 ENCOUNTER — Ambulatory Visit (HOSPITAL_COMMUNITY)
Admission: RE | Admit: 2012-01-19 | Discharge: 2012-01-19 | Disposition: A | Discharge status: Home / Self Care | Payer: Medicare HMO | Source: Ambulatory Visit | Attending: Cardiology | Admitting: Cardiology

## 2012-01-19 ENCOUNTER — Encounter (HOSPITAL_COMMUNITY): Payer: Self-pay | Admitting: Cardiology

## 2012-01-19 DIAGNOSIS — N186 End stage renal disease: Secondary | ICD-10-CM | POA: Insufficient documentation

## 2012-01-19 DIAGNOSIS — Z01810 Encounter for preprocedural cardiovascular examination: Secondary | ICD-10-CM | POA: Insufficient documentation

## 2012-01-19 DIAGNOSIS — I10 Essential (primary) hypertension: Secondary | ICD-10-CM

## 2012-01-19 DIAGNOSIS — R9431 Abnormal electrocardiogram [ECG] [EKG]: Secondary | ICD-10-CM

## 2012-01-19 NOTE — Progress Notes (Signed)
Stress Lab Nurses Notes - [facility] Hawking  EIN RIJO 01/19/2012 Reason for doing test: HTN and Surgical Clearance Type of test: Stress Echo Nurse performing test: Parke Poisson, RN Nuclear Medicine Tech: Not Applicable Echo Tech: Merrily Pew  MD performing test: R. Dietrich Pates Family MD: [facility] Test explained and consent signed: yes IV started: No IV started Symptoms: fatigue Treatment/Intervention: None Reason test stopped: fatigue After recovery IV was: None Patient to return to Nuc. Med at :  NA Patient discharged: Home Patient's Condition upon discharge was: stable Comments: During test peak BP 200/100 & HR 115.  Recovery BP 172/98 & HR 67.  Symptoms resolved in recovery. Erskine Speed T

## 2012-01-26 NOTE — Procedures (Unsigned)
VASCULAR LAB EXAM  INDICATION:  Evaluation for permanent dialysis access.  There is known occlusion of a previously created left radiocephalic fistula.  HISTORY: End-stage renal disease Diabetes:  No Cardiac:  No Hypertension:  Yes  EXAM:  Duplex evaluation of the left upper extremity for diameter of the cephalic and basilic veins.  The left cephalic vein is small, with thickened walls in the mid to distal upper arm.  The left basilic vein is patent, measuring greater than 0.42 cm.  The left basilic vein branches into 3 veins in the distal left upper arm.  There is a large perforating vein visualized between the basilic and brachial vein in the mid upper arm.  IMPRESSION: 1. Known occlusion of the left radiocephalic fistula. 2. Small sclerotic left cephalic vein in the upper arm. 3. Patent left basilic vein measuring greater than 0.42 cm in the     upper arm.  Branching of the basilic vein is noted in the distal     upper arm.      Please see attached diagram for diameters.   ___________________________________________ Janetta Hora Fields, MD  CI/MEDQ  D:  01/18/2012  T:  01/18/2012  Job:  [id number]

## 2012-02-01 ENCOUNTER — Other Ambulatory Visit: Payer: Self-pay | Admitting: *Deleted

## 2012-02-05 ENCOUNTER — Encounter (HOSPITAL_COMMUNITY): Payer: Self-pay | Admitting: Pharmacist

## 2012-02-11 MED ORDER — SODIUM CHLORIDE 0.9 % IV SOLN: INTRAVENOUS | Status: DC

## 2012-02-11 MED ORDER — VANCOMYCIN HCL 1000 MG IV SOLR
1500.0000 mg | INTRAVENOUS | Status: DC
  Filled 2012-02-11: qty 1500

## 2012-02-12 ENCOUNTER — Ambulatory Visit (HOSPITAL_COMMUNITY)
Admission: RE | Admit: 2012-02-12 | Discharge: 2012-02-12 | Discharge status: Against Medical Advice | Payer: Medicare HMO | Source: Ambulatory Visit | Attending: Vascular Surgery | Admitting: Vascular Surgery

## 2012-02-12 ENCOUNTER — Encounter (HOSPITAL_COMMUNITY)
Admission: RE | Discharge status: Against Medical Advice | Payer: Self-pay | Source: Ambulatory Visit | Attending: Vascular Surgery

## 2012-02-12 ENCOUNTER — Encounter (HOSPITAL_COMMUNITY): Payer: Self-pay | Admitting: *Deleted

## 2012-02-12 DIAGNOSIS — N186 End stage renal disease: Secondary | ICD-10-CM | POA: Insufficient documentation

## 2012-02-12 DIAGNOSIS — Z01812 Encounter for preprocedural laboratory examination: Secondary | ICD-10-CM | POA: Insufficient documentation

## 2012-02-12 DIAGNOSIS — Z538 Procedure and treatment not carried out for other reasons: Secondary | ICD-10-CM | POA: Insufficient documentation

## 2012-02-12 SURGERY — INSERTION OF ARTERIOVENOUS (AV) GORE-TEX GRAFT ARM
Anesthesia: Monitor Anesthesia Care

## 2012-02-12 MED ORDER — VANCOMYCIN HCL IN DEXTROSE 1-5 GM/200ML-% IV SOLN
INTRAVENOUS | Status: AC
  Filled 2012-02-12: qty 200

## 2012-02-12 MED ORDER — CLONIDINE HCL 0.3 MG PO TABS
0.3000 mg | ORAL_TABLET | Freq: Once | ORAL | Status: AC
  Administered 2012-02-12: 0.3 mg via ORAL
  Filled 2012-02-12: qty 1

## 2012-02-12 MED ORDER — MUPIROCIN 2 % EX OINT
TOPICAL_OINTMENT | CUTANEOUS | Status: AC
  Filled 2012-02-12: qty 22

## 2012-02-12 SURGICAL SUPPLY — 32 items
ADH SKN CLS APL DERMABOND .7 (GAUZE/BANDAGES/DRESSINGS) ×1
CANISTER SUCTION 2500CC (MISCELLANEOUS) ×3 IMPLANT
CLIP TI MEDIUM 6 (CLIP) ×3 IMPLANT
CLIP TI WIDE RED SMALL 6 (CLIP) ×3 IMPLANT
CLOTH BEACON ORANGE TIMEOUT ST (SAFETY) ×3 IMPLANT
COVER SURGICAL LIGHT HANDLE (MISCELLANEOUS) ×6 IMPLANT
DECANTER SPIKE VIAL GLASS SM (MISCELLANEOUS) ×3 IMPLANT
DERMABOND ADVANCED (GAUZE/BANDAGES/DRESSINGS) ×1
DERMABOND ADVANCED .7 DNX12 (GAUZE/BANDAGES/DRESSINGS) ×2 IMPLANT
ELECT REM PT RETURN 9FT ADLT (ELECTROSURGICAL) ×2
ELECTRODE REM PT RTRN 9FT ADLT (ELECTROSURGICAL) ×2 IMPLANT
GAUZE SPONGE 2X2 8PLY STRL LF (GAUZE/BANDAGES/DRESSINGS) ×2 IMPLANT
GEL ULTRASOUND 20GR AQUASONIC (MISCELLANEOUS) IMPLANT
GLOVE BIO SURGEON STRL SZ7.5 (GLOVE) ×3 IMPLANT
GOWN PREVENTION PLUS XLARGE (GOWN DISPOSABLE) ×3 IMPLANT
GOWN STRL NON-REIN LRG LVL3 (GOWN DISPOSABLE) ×6 IMPLANT
KIT BASIN OR (CUSTOM PROCEDURE TRAY) ×3 IMPLANT
KIT ROOM TURNOVER OR (KITS) ×3 IMPLANT
LOOP VESSEL MINI RED (MISCELLANEOUS) IMPLANT
NS IRRIG 1000ML POUR BTL (IV SOLUTION) ×3 IMPLANT
PACK CV ACCESS (CUSTOM PROCEDURE TRAY) ×3 IMPLANT
PAD ARMBOARD 7.5X6 YLW CONV (MISCELLANEOUS) ×6 IMPLANT
SPONGE GAUZE 2X2 STER 10/PKG (GAUZE/BANDAGES/DRESSINGS) ×1
SPONGE SURGIFOAM ABS GEL 100 (HEMOSTASIS) IMPLANT
SUT PROLENE 6 0 CC (SUTURE) ×6 IMPLANT
SUT VIC AB 3-0 SH 27 (SUTURE) ×4
SUT VIC AB 3-0 SH 27X BRD (SUTURE) ×4 IMPLANT
SUT VICRYL 4-0 PS2 18IN ABS (SUTURE) ×6 IMPLANT
TOWEL OR 17X24 6PK STRL BLUE (TOWEL DISPOSABLE) ×3 IMPLANT
TOWEL OR 17X26 10 PK STRL BLUE (TOWEL DISPOSABLE) ×3 IMPLANT
UNDERPAD 30X30 INCONTINENT (UNDERPADS AND DIAPERS) ×3 IMPLANT
WATER STERILE IRR 1000ML POUR (IV SOLUTION) ×3 IMPLANT

## 2012-02-12 NOTE — Progress Notes (Signed)
0750  SPOKE WITH DR. SINGER  REGARDING  PATIENTS BP (175/108),..PATIENT STATED HE  DID  NOT TAKE ANY BP MEDS THIS AM...ORDER WAS GIVEN TO HOLD THE ATENOLOL (HR 53) AND GIVE THE CLONIDINE 0.3 MG INSTEAD...SO, IT HAS BEEN ORDERED...Marland KitchenDA

## 2012-02-12 NOTE — Progress Notes (Signed)
Patient wanted to leave and not wait for surgery. Called Dr. Darrick Penna and he said patient could leave and they would reschedule./

## 2012-02-13 LAB — POCT I-STAT 4, (NA,K, GLUC, HGB,HCT)
Glucose, Bld: 89 mg/dL (ref 70–99)
Hemoglobin: 12.6 g/dL — ABNORMAL LOW (ref 13.0–17.0)
Potassium: 3.6 mEq/L (ref 3.5–5.1)

## 2012-02-13 MED FILL — Mupirocin Oint 2%: CUTANEOUS | Qty: 22 | Status: AC

## 2012-02-23 ENCOUNTER — Other Ambulatory Visit: Payer: Self-pay | Admitting: *Deleted

## 2012-02-28 ENCOUNTER — Encounter (HOSPITAL_COMMUNITY): Payer: Self-pay | Admitting: Emergency Medicine

## 2012-02-28 ENCOUNTER — Emergency Department (HOSPITAL_COMMUNITY)
Admission: EM | Admit: 2012-02-28 | Discharge: 2012-02-28 | Disposition: A | Discharge status: Home / Self Care | Payer: Medicare HMO | Attending: Emergency Medicine | Admitting: Emergency Medicine

## 2012-02-28 ENCOUNTER — Emergency Department (HOSPITAL_COMMUNITY): Payer: Medicare HMO

## 2012-02-28 DIAGNOSIS — K219 Gastro-esophageal reflux disease without esophagitis: Secondary | ICD-10-CM | POA: Insufficient documentation

## 2012-02-28 DIAGNOSIS — I120 Hypertensive chronic kidney disease with stage 5 chronic kidney disease or end stage renal disease: Secondary | ICD-10-CM | POA: Insufficient documentation

## 2012-02-28 DIAGNOSIS — G4733 Obstructive sleep apnea (adult) (pediatric): Secondary | ICD-10-CM | POA: Insufficient documentation

## 2012-02-28 DIAGNOSIS — I10 Essential (primary) hypertension: Secondary | ICD-10-CM

## 2012-02-28 DIAGNOSIS — R040 Epistaxis: Secondary | ICD-10-CM

## 2012-02-28 DIAGNOSIS — E669 Obesity, unspecified: Secondary | ICD-10-CM | POA: Insufficient documentation

## 2012-02-28 DIAGNOSIS — M109 Gout, unspecified: Secondary | ICD-10-CM | POA: Insufficient documentation

## 2012-02-28 DIAGNOSIS — J069 Acute upper respiratory infection, unspecified: Secondary | ICD-10-CM | POA: Insufficient documentation

## 2012-02-28 DIAGNOSIS — Z87891 Personal history of nicotine dependence: Secondary | ICD-10-CM | POA: Insufficient documentation

## 2012-02-28 DIAGNOSIS — N185 Chronic kidney disease, stage 5: Secondary | ICD-10-CM | POA: Insufficient documentation

## 2012-02-28 MED ORDER — OXYMETAZOLINE HCL 0.05 % NA SOLN
NASAL | Status: AC
  Administered 2012-02-28: 04:00:00
  Filled 2012-02-28: qty 15

## 2012-02-28 MED ORDER — LIDOCAINE-EPINEPHRINE (PF) 2 %-1:200000 IJ SOLN
INTRAMUSCULAR | Status: AC
  Administered 2012-02-28: 04:00:00
  Filled 2012-02-28: qty 20

## 2012-02-28 MED ORDER — MOXIFLOXACIN HCL 400 MG PO TABS: 400.0000 mg | ORAL_TABLET | Freq: Every day | ORAL | Status: DC

## 2012-02-28 MED ORDER — SILVER NITRATE-POT NITRATE 75-25 % EX MISC
CUTANEOUS | Status: AC
  Administered 2012-02-28: 04:00:00
  Filled 2012-02-28: qty 1

## 2012-02-28 MED ORDER — LIDOCAINE-EPINEPHRINE (PF) 1 %-1:200000 IJ SOLN
INTRAMUSCULAR | Status: AC
  Filled 2012-02-28: qty 10

## 2012-02-28 NOTE — ED Notes (Signed)
Patient c/o cough and cold symptoms since yesterday.  States woke up this morning with a nosebleed; no bleeding at this time.

## 2012-02-28 NOTE — ED Provider Notes (Signed)
History     CSN: [id number]  Arrival date & time 02/28/12  0304   First MD Initiated Contact with Patient 02/28/12 0319      Chief Complaint  Patient presents with  . Nasal Congestion  . Cough    (Consider location/radiation/quality/duration/timing/severity/associated sxs/prior treatment) HPI Comments: Matthew Kane 61 y.o. male   The chief complaint is: Patient presents with:   Nasal Congestion   Cough   The patient has medical history significant for:   Past Medical History:   Hypertension                                                   Comment:Echo in 10/2006-moderate LVH, normal EF   Chronic kidney disease, stage 5, kidney failure                Comment: dialysis at [facility] T/TH/Sat; ESRD as of               05/2011; leg cramps; hypokalemia; microcytosis   Obstructive sleep apnea                         2008           Comment:2008   Obesity                                                      Gout                                                         Gastroesophageal reflux disease                              Erectile dysfunction                                         Prostatic hypertrophy, benign                                Allergic rhinitis                                           The onset of the symptoms was  2 days ago The Course is  persistent, gradually worsened Nothing makes symptoms worse, nothing makes symptoms better Has associated epistaxis from the right nostril after a sneezing spell this evening Denies fevers, chills, sore throat, headache, swelling, chest pain, shortness of breath, back pain, rashes.  The patient is a dialysis patient who developed a congested nose and a productive cough approximately 2 days ago. He has not missed any dialysis and denies any fevers. He admits that the cough is a productive cough  Patient is a 61 y.o. male presenting with cough. The history is provided by the patient and medical records.   Cough    Past Medical History  Diagnosis Date  . Hypertension     Echo in 10/2006-moderate LVH, normal EF  . Chronic kidney disease, stage 5, kidney failure      dialysis at [facility] T/TH/Sat; ESRD as of 05/2011; leg cramps; hypokalemia; microcytosis  . Obstructive sleep apnea 2008    2008  . Obesity   . Gout   . Gastroesophageal reflux disease   . Erectile dysfunction   . Prostatic hypertrophy, benign   . Allergic rhinitis     Past Surgical History  Procedure Date  . Total hip arthroplasty     bilateral  . Portacath placement   . Cholecystectomy   . Finger amputation     right middle  . Colonoscopy 07/12/2011    Procedure: COLONOSCOPY;  Surgeon: Arlyce Harman, MD;  [facility];  Service: Endoscopy;  Laterality: N/A;  1:00  . Av fistula placement 05-31-11    Right radiocephalic AVF  . 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 Foot surgery     Repair of chain saw injury    Family History  Problem Relation Age of Onset  . Lung cancer Father 54    hepatic metastases  . Heart attack Mother 68  . Cancer Brother     unk    History  Substance Use Topics  . Smoking status: Former Smoker -- 1.0 packs/day for 10 years    Types: Cigarettes    Quit date: 01/11/1975  . Smokeless tobacco: Never Used  . Alcohol Use: No      Review of Systems  Respiratory: Positive for cough.   All other systems reviewed and are negative.    Allergies  Penicillins  Home Medications   Current Outpatient Rx  Name Route Sig Dispense Refill  . ACETAMINOPHEN 500 MG PO TABS Oral Take 1,000 mg by mouth every 4 (four) hours as needed. For headache    . AMLODIPINE BESYLATE 10 MG PO TABS Oral Take 10 mg by mouth every morning.     . ASPIRIN 81 MG PO CHEW Oral Chew 162 mg by mouth every morning.    Marland Kitchen NEPHRO-VITE 0.8 MG PO TABS Oral Take 0.8 mg by mouth every morning. Takes both Rena-vite and Nephro-vite    . CALCIUM ACETATE 667  MG PO CAPS Oral Take 667-2,001 mg by mouth 3 (three) times daily with meals. 3 caps with breakfast, 1 cap with lunch, 1 cap with dinner    . METOPROLOL SUCCINATE ER 50 MG PO TB24 Oral Take 50 mg by mouth every morning. Take with or immediately following a meal.    . RENA-VITE PO TABS Oral Take 1 tablet by mouth every morning. Takes both Rena-vite and Nephro-vite    . SENSIPAR 30 MG PO TABS Oral Take 30 mg by mouth every morning.     Marland Kitchen CLONIDINE HCL 0.3 MG PO TABS Oral Take 0.3 mg by mouth every morning.     Marland Kitchen MOXIFLOXACIN HCL 400 MG PO TABS Oral Take 1 tablet (400 mg total) by mouth daily. 7 tablet 0    BP 174/113  Pulse 64  Temp 98.8 F (37.1 C) (Oral)  Resp 18  Ht 6' (1.829 m)  Wt 219 lb (99.338 kg)  BMI 29.70 kg/m2  SpO2 97%  Physical Exam  Nursing  note and vitals reviewed. Constitutional: He appears well-developed and well-nourished. No distress.  HENT:  Head: Normocephalic and atraumatic.  Mouth/Throat: Oropharynx is clear and moist. No oropharyngeal exudate.       Minimal blood in the right nostril, no signs of active bleeding, no bleeding in the left nostril, oropharynx is clear without blood. Mucous membranes are moist.  Eyes: Conjunctivae and EOM are normal. Pupils are equal, round, and reactive to light. Right eye exhibits no discharge. Left eye exhibits no discharge. No scleral icterus.  Neck: Normal range of motion. Neck supple. No JVD present. No thyromegaly present.  Cardiovascular: Normal rate, regular rhythm, normal heart sounds and intact distal pulses.  Exam reveals no gallop and no friction rub.   No murmur heard. Pulmonary/Chest: Effort normal and breath sounds normal. No respiratory distress. He has no wheezes. He has no rales.  Abdominal: Soft. Bowel sounds are normal. He exhibits no distension and no mass. There is no tenderness.  Musculoskeletal: Normal range of motion. He exhibits no edema and no tenderness.  Lymphadenopathy:    He has no cervical adenopathy.   Neurological: He is alert. Coordination normal.  Skin: Skin is warm and dry. No rash noted. No erythema.  Psychiatric: He has a normal mood and affect. His behavior is normal.    ED Course  EPISTAXIS MANAGEMENT Date/Time: 02/28/2012 4:00 AM Performed by: Eber Hong D Authorized by: Eber Hong D Consent: Verbal consent obtained. Risks and benefits: risks, benefits and alternatives were discussed Consent given by: patient Patient understanding: patient states understanding of the procedure being performed Patient identity confirmed: verbally with patient and arm band Time out: Immediately prior to procedure a "time out" was called to verify the correct patient, procedure, equipment, support staff and site/side marked as required. Anesthesia method: Topical lidocaine. Patient sedated: no Treatment site: right anterior Repair method: silver nitrate (Afrin nasal spray) Post-procedure assessment: bleeding stopped Treatment complexity: simple Patient tolerance: Patient tolerated the procedure well with no immediate complications.   (including critical care time)  Labs Reviewed - No data to display Dg Chest 2 View  02/28/2012  *RADIOLOGY REPORT*  Clinical Data: Cough.  Upper respiratory infection.  Epistaxis. Hypertension.  CHEST - 2 VIEW  Comparison: 05/11/2011  Findings: Right central line tip projects over the cavoatrial junction.  Tortuous thoracic aorta noted.  Mild cardiomegaly is present.  The lungs appear clear.  Thoracic spondylosis noted.  IMPRESSION: 1.  Mild cardiomegaly, without edema.  2.  Thoracic spondylosis.  3.  Tortuous thoracic aorta.  4.  No pneumonia identified.  Original Report Authenticated By: Dellia Cloud, M.D.     1. Right-sided epistaxis   2. Hypertension   3. URI (upper respiratory infection)       MDM  Check chest x-ray to rule out pneumonia, no fever tachycardia hypotension or significant hypoxia. Afrin and further evaluation of  nosebleed.   I have personally interpreted the Chest xray and find her to be no signs of acute infiltrate, pneumothorax, widened mediastinum. There is a somewhat tortuous aorta in the presence of a Port-A-Cath but no other significant acute findings.  Pt overall well appearing, will take Htn meds when gets home, bleeding has stopped successfully after silver nitrate application to the septal area with bleeding.  Medication applied only briefly so as to avoid damage to the septum.  Pt informed of necessary f/u with PMD vs ENT if sx continue / worsen.    Pt has improved significantly with interventions as listed above,  instructions given on taking blood pressure medications upon arrival home. Afrin discussed with patient, prescription for Avelox given should he develop worsening symptoms. Patient has expressed understanding for indications for return.  Discharge Prescriptions include:  avelox (to be used if sx worsen)     Vida Roller, MD 02/28/12 [phone]

## 2012-03-04 ENCOUNTER — Encounter (HOSPITAL_COMMUNITY): Payer: Self-pay | Admitting: Respiratory Therapy

## 2012-03-29 ENCOUNTER — Encounter (HOSPITAL_COMMUNITY): Payer: Self-pay | Admitting: *Deleted

## 2012-03-29 NOTE — Progress Notes (Signed)
While reviewing patients chart for Pre- op call , I noticed that patient had spasms after LMA.  I notified Grafton Folk PA for anesthesia with this information.

## 2012-03-31 MED ORDER — VANCOMYCIN HCL 1000 MG IV SOLR
1500.0000 mg | INTRAVENOUS | Status: DC
  Filled 2012-03-31: qty 1500

## 2012-04-01 ENCOUNTER — Ambulatory Visit (HOSPITAL_COMMUNITY): Payer: Medicare HMO | Admitting: Certified Registered"

## 2012-04-01 ENCOUNTER — Ambulatory Visit (HOSPITAL_COMMUNITY)
Admission: RE | Admit: 2012-04-01 | Discharge: 2012-04-01 | Disposition: A | Discharge status: Home / Self Care | Payer: Medicare HMO | Source: Ambulatory Visit | Attending: Vascular Surgery | Admitting: Vascular Surgery

## 2012-04-01 ENCOUNTER — Encounter (HOSPITAL_COMMUNITY): Payer: Self-pay | Admitting: Certified Registered"

## 2012-04-01 ENCOUNTER — Encounter (HOSPITAL_COMMUNITY)
Admission: RE | Disposition: A | Discharge status: Home / Self Care | Payer: Self-pay | Source: Ambulatory Visit | Attending: Vascular Surgery

## 2012-04-01 ENCOUNTER — Encounter (HOSPITAL_COMMUNITY): Payer: Self-pay | Admitting: *Deleted

## 2012-04-01 DIAGNOSIS — I120 Hypertensive chronic kidney disease with stage 5 chronic kidney disease or end stage renal disease: Secondary | ICD-10-CM | POA: Insufficient documentation

## 2012-04-01 DIAGNOSIS — T82898A Other specified complication of vascular prosthetic devices, implants and grafts, initial encounter: Secondary | ICD-10-CM | POA: Insufficient documentation

## 2012-04-01 DIAGNOSIS — Y832 Surgical operation with anastomosis, bypass or graft as the cause of abnormal reaction of the patient, or of later complication, without mention of misadventure at the time of the procedure: Secondary | ICD-10-CM | POA: Insufficient documentation

## 2012-04-01 DIAGNOSIS — N186 End stage renal disease: Secondary | ICD-10-CM

## 2012-04-01 HISTORY — PX: AV FISTULA PLACEMENT: SHX1204

## 2012-04-01 LAB — POCT I-STAT 4, (NA,K, GLUC, HGB,HCT)
Glucose, Bld: 83 mg/dL (ref 70–99)
HCT: 34 % — ABNORMAL LOW (ref 39.0–52.0)
Potassium: 3.4 mEq/L — ABNORMAL LOW (ref 3.5–5.1)

## 2012-04-01 LAB — SURGICAL PCR SCREEN: MRSA, PCR: NEGATIVE

## 2012-04-01 SURGERY — INSERTION OF ARTERIOVENOUS (AV) GORE-TEX GRAFT ARM
Anesthesia: Monitor Anesthesia Care | Site: Arm Lower | Laterality: Left

## 2012-04-01 MED ORDER — VANCOMYCIN HCL IN DEXTROSE 1-5 GM/200ML-% IV SOLN: 1000.0000 mg | Freq: Once | INTRAVENOUS | Status: DC

## 2012-04-01 MED ORDER — SODIUM CHLORIDE 0.9 % IR SOLN
Status: DC | PRN
  Administered 2012-04-01: 08:00:00

## 2012-04-01 MED ORDER — HEPARIN SODIUM (PORCINE) 1000 UNIT/ML IJ SOLN
INTRAMUSCULAR | Status: DC | PRN
  Administered 2012-04-01: 5000 [IU] via INTRAVENOUS

## 2012-04-01 MED ORDER — VANCOMYCIN HCL IN DEXTROSE 1-5 GM/200ML-% IV SOLN
INTRAVENOUS | Status: AC
  Administered 2012-04-01: 1000 mg via INTRAVENOUS
  Filled 2012-04-01: qty 200

## 2012-04-01 MED ORDER — FENTANYL CITRATE 0.05 MG/ML IJ SOLN
INTRAMUSCULAR | Status: DC | PRN
  Administered 2012-04-01 (×2): 25 ug via INTRAVENOUS

## 2012-04-01 MED ORDER — MUPIROCIN 2 % EX OINT
TOPICAL_OINTMENT | CUTANEOUS | Status: AC
  Administered 2012-04-01: 1 via NASAL
  Filled 2012-04-01: qty 22

## 2012-04-01 MED ORDER — HYDROMORPHONE HCL PF 1 MG/ML IJ SOLN
0.2500 mg | INTRAMUSCULAR | Status: DC | PRN
  Administered 2012-04-01 (×2): 0.5 mg via INTRAVENOUS

## 2012-04-01 MED ORDER — 0.9 % SODIUM CHLORIDE (POUR BTL) OPTIME
TOPICAL | Status: DC | PRN
  Administered 2012-04-01: 1000 mL

## 2012-04-01 MED ORDER — OXYCODONE HCL 5 MG PO TABS: 5.0000 mg | ORAL_TABLET | ORAL | Status: AC | PRN

## 2012-04-01 MED ORDER — OXYCODONE HCL 5 MG PO TABS
ORAL_TABLET | ORAL | Status: AC
  Administered 2012-04-01: 5 mg
  Filled 2012-04-01: qty 1

## 2012-04-01 MED ORDER — LIDOCAINE HCL (PF) 1 % IJ SOLN
INTRAMUSCULAR | Status: AC
  Filled 2012-04-01: qty 30

## 2012-04-01 MED ORDER — METOPROLOL SUCCINATE ER 50 MG PO TB24
50.0000 mg | ORAL_TABLET | Freq: Once | ORAL | Status: AC
  Administered 2012-04-01: 50 mg via ORAL
  Filled 2012-04-01: qty 1

## 2012-04-01 MED ORDER — LIDOCAINE HCL (PF) 1 % IJ SOLN
INTRAMUSCULAR | Status: DC | PRN
  Administered 2012-04-01: 3 mL
  Administered 2012-04-01: 30 mL

## 2012-04-01 MED ORDER — HYDROMORPHONE HCL PF 1 MG/ML IJ SOLN
INTRAMUSCULAR | Status: AC
  Filled 2012-04-01: qty 1

## 2012-04-01 MED ORDER — PROTAMINE SULFATE 10 MG/ML IV SOLN
INTRAVENOUS | Status: DC | PRN
  Administered 2012-04-01: 10 mg via INTRAVENOUS
  Administered 2012-04-01 (×2): 20 mg via INTRAVENOUS

## 2012-04-01 MED ORDER — SODIUM CHLORIDE 0.9 % IV SOLN
INTRAVENOUS | Status: DC | PRN
  Administered 2012-04-01: 07:00:00 via INTRAVENOUS

## 2012-04-01 MED ORDER — PROPOFOL INFUSION 10 MG/ML OPTIME
INTRAVENOUS | Status: DC | PRN
  Administered 2012-04-01: 50 ug/kg/min via INTRAVENOUS

## 2012-04-01 MED ORDER — MUPIROCIN 2 % EX OINT
TOPICAL_OINTMENT | Freq: Two times a day (BID) | CUTANEOUS | Status: DC
  Administered 2012-04-01: 1 via NASAL

## 2012-04-01 MED ORDER — SODIUM CHLORIDE 0.9 % IV SOLN: INTRAVENOUS | Status: DC

## 2012-04-01 MED ORDER — THROMBIN 20000 UNITS EX SOLR
CUTANEOUS | Status: AC
  Filled 2012-04-01: qty 20000

## 2012-04-01 MED ORDER — MIDAZOLAM HCL 5 MG/5ML IJ SOLN
INTRAMUSCULAR | Status: DC | PRN
  Administered 2012-04-01: 1 mg via INTRAVENOUS

## 2012-04-01 MED ORDER — LIDOCAINE HCL (PF) 1 % IJ SOLN
INTRAMUSCULAR | Status: AC
Start: 2012-04-01 — End: 2012-04-01
  Filled 2012-04-01: qty 30

## 2012-04-01 SURGICAL SUPPLY — 44 items
ADH SKN CLS APL DERMABOND .7 (GAUZE/BANDAGES/DRESSINGS) ×1
ADH SKN CLS LQ APL DERMABOND (GAUZE/BANDAGES/DRESSINGS) ×1
CANISTER SUCTION 2500CC (MISCELLANEOUS) ×2 IMPLANT
CLIP TI MEDIUM 6 (CLIP) ×2 IMPLANT
CLIP TI WIDE RED SMALL 6 (CLIP) ×2 IMPLANT
CLOTH BEACON ORANGE TIMEOUT ST (SAFETY) ×2 IMPLANT
COVER SURGICAL LIGHT HANDLE (MISCELLANEOUS) ×2 IMPLANT
DECANTER SPIKE VIAL GLASS SM (MISCELLANEOUS) ×1 IMPLANT
DERMABOND ADHESIVE PROPEN (GAUZE/BANDAGES/DRESSINGS) ×1
DERMABOND ADVANCED (GAUZE/BANDAGES/DRESSINGS) ×1
DERMABOND ADVANCED .7 DNX12 (GAUZE/BANDAGES/DRESSINGS) ×1 IMPLANT
DERMABOND ADVANCED .7 DNX6 (GAUZE/BANDAGES/DRESSINGS) IMPLANT
ELECT REM PT RETURN 9FT ADLT (ELECTROSURGICAL) ×2
ELECTRODE REM PT RTRN 9FT ADLT (ELECTROSURGICAL) ×1 IMPLANT
GAUZE SPONGE 2X2 8PLY STRL LF (GAUZE/BANDAGES/DRESSINGS) ×1 IMPLANT
GEL ULTRASOUND 20GR AQUASONIC (MISCELLANEOUS) ×1 IMPLANT
GLOVE BIO SURGEON STRL SZ 6.5 (GLOVE) ×1 IMPLANT
GLOVE BIO SURGEON STRL SZ7.5 (GLOVE) ×2 IMPLANT
GLOVE BIOGEL PI IND STRL 6.5 (GLOVE) IMPLANT
GLOVE BIOGEL PI IND STRL 7.0 (GLOVE) IMPLANT
GLOVE BIOGEL PI IND STRL 7.5 (GLOVE) IMPLANT
GLOVE BIOGEL PI INDICATOR 6.5 (GLOVE) ×1
GLOVE BIOGEL PI INDICATOR 7.0 (GLOVE) ×1
GLOVE BIOGEL PI INDICATOR 7.5 (GLOVE) ×1
GLOVE SS N UNI LF 7.5 STRL (GLOVE) ×2 IMPLANT
GOWN PREVENTION PLUS XLARGE (GOWN DISPOSABLE) ×5 IMPLANT
GOWN STRL NON-REIN LRG LVL3 (GOWN DISPOSABLE) ×4 IMPLANT
GRAFT GORETEX STRT 6X50 (Vascular Products) ×1 IMPLANT
KIT BASIN OR (CUSTOM PROCEDURE TRAY) ×2 IMPLANT
KIT ROOM TURNOVER OR (KITS) ×2 IMPLANT
LOOP VESSEL MINI RED (MISCELLANEOUS) IMPLANT
NS IRRIG 1000ML POUR BTL (IV SOLUTION) ×2 IMPLANT
PACK CV ACCESS (CUSTOM PROCEDURE TRAY) ×2 IMPLANT
PAD ARMBOARD 7.5X6 YLW CONV (MISCELLANEOUS) ×4 IMPLANT
SPONGE GAUZE 2X2 STER 10/PKG (GAUZE/BANDAGES/DRESSINGS) ×1
SPONGE SURGIFOAM ABS GEL 100 (HEMOSTASIS) IMPLANT
SUT PROLENE 6 0 CC (SUTURE) ×4 IMPLANT
SUT VIC AB 3-0 SH 27 (SUTURE) ×4
SUT VIC AB 3-0 SH 27X BRD (SUTURE) ×2 IMPLANT
SUT VICRYL 4-0 PS2 18IN ABS (SUTURE) ×4 IMPLANT
TOWEL OR 17X24 6PK STRL BLUE (TOWEL DISPOSABLE) ×2 IMPLANT
TOWEL OR 17X26 10 PK STRL BLUE (TOWEL DISPOSABLE) ×2 IMPLANT
UNDERPAD 30X30 INCONTINENT (UNDERPADS AND DIAPERS) ×2 IMPLANT
WATER STERILE IRR 1000ML POUR (IV SOLUTION) ×2 IMPLANT

## 2012-04-01 NOTE — H&P (Signed)
  Patient is a 61 year old male who returns today for further followup after branch the side branch ligation of a left radiocephalic AV fistula. He is currently dialyzing via a right-sided catheter. He previously had a right brachiocephalic AV fistula and had problems on the right side with swelling.    Physical exam:   Blood pressure 200/119, pulse 74, temperature 98.1 F (36.7 C), temperature source Oral, resp. rate 18, SpO2 96.00%.  Healing incisions left forearm, No audible bruit or thrill 2+ left brachial pulse  Assessment/Plan Occluded left radiocephalic AV fistula, no vein for left fistula, AV graft left today   Fabienne Bruns, MD  [facility]  Office: [phone]  Pager: [phone]

## 2012-04-01 NOTE — Transfer of Care (Signed)
Immediate Anesthesia Transfer of Care Note  Patient: Matthew Kane  Procedure(s) Performed: Procedure(s) (LRB): INSERTION OF ARTERIOVENOUS (AV) GORE-TEX GRAFT ARM (Left)  Patient Location: PACU  Anesthesia Type: MAC  Level of Consciousness: awake, alert  and oriented  Airway & Oxygen Therapy: Patient Spontanous Breathing  Post-op Assessment: Report given to PACU RN  Post vital signs: stable  Complications: No apparent anesthesia complications

## 2012-04-01 NOTE — Anesthesia Postprocedure Evaluation (Signed)
  Anesthesia Post-op Note  Patient: Matthew Kane  Procedure(s) Performed: Procedure(s) (LRB): INSERTION OF ARTERIOVENOUS (AV) GORE-TEX GRAFT ARM (Left)  Patient Location: PACU  Anesthesia Type: MAC  Level of Consciousness: awake  Airway and Oxygen Therapy: Patient Spontanous Breathing  Post-op Pain: mild  Post-op Assessment: Post-op Vital signs reviewed  Post-op Vital Signs: Reviewed  Complications: No apparent anesthesia complications

## 2012-04-01 NOTE — Anesthesia Preprocedure Evaluation (Addendum)
Anesthesia Evaluation  Patient identified by MRN, date of birth, ID band Patient awake    Reviewed: Allergy & Precautions, H&P , NPO status , Patient's Chart, lab work & pertinent test results, reviewed documented beta blocker date and time   History of Anesthesia Complications (+) DIFFICULT AIRWAY  Airway Mallampati: III TM Distance: <3 FB Neck ROM: Full    Dental  (+) Teeth Intact and Dental Advisory Given   Pulmonary sleep apnea and Continuous Positive Airway Pressure Ventilation ,  breath sounds clear to auscultation        Cardiovascular hypertension, Pt. on medications Rhythm:Regular Rate:Normal     Neuro/Psych Anxiety Depression    GI/Hepatic GERD-  Medicated,  Endo/Other    Renal/GU CRF and DialysisRenal disease     Musculoskeletal   Abdominal (+)  Abdomen: soft. Bowel sounds: normal.  Peds  Hematology   Anesthesia Other Findings   Reproductive/Obstetrics                        Anesthesia Physical Anesthesia Plan  ASA: IV  Anesthesia Plan: General   Post-op Pain Management:    Induction: Intravenous  Airway Management Planned:   Additional Equipment:   Intra-op Plan:   Post-operative Plan:   Informed Consent: I have reviewed the patients History and Physical, chart, labs and discussed the procedure including the risks, benefits and alternatives for the proposed anesthesia with the patient or authorized representative who has indicated his/her understanding and acceptance.     Plan Discussed with: CRNA and Anesthesiologist  Anesthesia Plan Comments:         Anesthesia Quick Evaluation

## 2012-04-01 NOTE — Op Note (Signed)
Procedure: Left forearm AV graft  Preoperative diagnosis: End-stage renal disease  Postoperative diagnosis: Same  Anesthesia: General.  Assistant: Theodosia Quay  Operative findings: 6 mm PTFE graft  Operative details: After obtaining informed consent, the patient was taken to the operating room. The patient was placed in supine position on the operating room table. After adequate sedation, the patient's entire left upper extremity was prepped and draped in usual sterile fashion. A longitudinal incision was made in the left antecubital area. Incision was carried down through the subcutaneous tissues down to the level of the left deep brachial vein. The vein was approximately 3 mm in diameter.  Next the brachial artery was dissected free in the medial portion incision. The brachial artery was approximately 3 mm in diameter.  Adjacent deep brachial vein was also approximately 3 mm in diameter. This was dissected free circumferentially to the selected as the outflow for the graft. Next a transverse incision was made in the distal forearm for assistance in tunneling. A subcutaneous tunnel was then created in a loop configuration down the forearm and a 6 mm PTFE graft was brought through this subcutaneous tunnel with the arterial limb on the radial aspect of the forearm. There was some tunnel bleeding most likely from an old fistula site which was controlled with pressure.  The patient was given 5000 units of intravenous heparin. After approximately 2 minutes of circulation time, Vesseloops were used to control the brachial artery proximally and distally. A longitudinal opening was made in the brachial artery the end of the graft was slightly beveled and sewn end of graft to side of artery using a running 6-0 Prolene suture. Prior to completion of the anastomosis the artery was forward bled back bled and thoroughly flushed. The anastomosis was secured, vessel loops released, and there was pulsatile  flow in the graft immediately. Attention was then turned to the venous limb of the graft. Small bulldog clamps were used to control the outflow vein proximally and distally. A longitudinal opening was made in the deep brachial vein the graft was cut to length and spatulated and sewn end of graft to side of vein using a running 6-0 Prolene suture. Just prior to completion of anastomosis it was forward bled back bled and thoroughly flushed. The anastomosis was secured, Vesseloops were released, and  there was a palpable thrill above the graft immediately. Hemostasis was obtained with direct pressure and the assistance of 50 mg of protamine. Next subcutaneous tissues of both incisions reapproximated using a running 3-0 Vicryl suture. The skin of both incisions was closed with 4-0 Vicryl subcuticular stitch. Dermabond was applied to both incisions.  The patient tolerated the procedure well and there were no complications. Instrument sponge and needle counts were correct at the end of the case. The patient was taken to the recovery room in stable condition. The patient had a palpable radial pulse at the end of the case.  Fabienne Bruns, MD [facility] [place] Office: [phone] Pager: [phone]

## 2012-04-01 NOTE — Anesthesia Procedure Notes (Signed)
Procedure Name: MAC Date/Time: 04/01/2012 7:50 AM Performed by: Ellin Goodie Pre-anesthesia Checklist: Patient identified, Emergency Drugs available, Patient being monitored, Suction available and Timeout performed Patient Re-evaluated:Patient Re-evaluated prior to inductionOxygen Delivery Method: Simple face mask Preoxygenation: Pre-oxygenation with 100% oxygen Intubation Type: IV induction Dental Injury: Teeth and Oropharynx as per pre-operative assessment

## 2012-04-01 NOTE — Anesthesia Postprocedure Evaluation (Signed)
  Anesthesia Post-op Note  Patient: Matthew Kane  Procedure(s) Performed: Procedure(s) (LRB): INSERTION OF ARTERIOVENOUS (AV) GORE-TEX GRAFT ARM (Left)  Patient Location: PACU  Anesthesia Type: General  Level of Consciousness: awake  Airway and Oxygen Therapy: Patient Spontanous Breathing  Post-op Pain: mild  Post-op Assessment: Post-op Vital signs reviewed  Post-op Vital Signs: Reviewed  Complications: No apparent anesthesia complications

## 2012-04-01 NOTE — Preoperative (Signed)
Beta Blockers   Reason not to administer Beta Blockers:Not Applicable 

## 2012-04-01 NOTE — Progress Notes (Signed)
Received order per Dr Randa Evens to give oxycodone IR 5 mg before d/c home.

## 2012-04-02 ENCOUNTER — Encounter (HOSPITAL_COMMUNITY): Payer: Self-pay | Admitting: Vascular Surgery

## 2012-04-05 ENCOUNTER — Ambulatory Visit: Payer: Medicare HMO | Admitting: Urology

## 2012-04-25 ENCOUNTER — Telehealth: Payer: Self-pay | Admitting: Vascular Surgery

## 2012-04-25 NOTE — Telephone Encounter (Addendum)
Message copied by Shari Prows on Thu Apr 25, 2012  3:03 PM ------      Message from: Melene Plan      Created: Thu Apr 25, 2012 11:16 AM      Regarding: RE: surgery fu appt      Contact: [phone]       For AVF it is 4-6 weeks for office visit. Thanks            ----- Message -----         From: Shari Prows         Sent: 04/24/2012   3:58 PM           To: Melene Plan, RN      Subject: surgery fu appt                                          Pt called me to inquire if he needs a surgery fu appt. He has an avf placed by cef on 04/01/12. Let me know and I will call him to schedule.       Thanks, Drinda Butts      I spoke w/ the above pt and scheduled an appt for him to see CEF on 05/09/12 at 8:45am. awt

## 2012-05-08 ENCOUNTER — Encounter: Payer: Self-pay | Admitting: Vascular Surgery

## 2012-05-09 ENCOUNTER — Ambulatory Visit: Payer: Medicare HMO | Admitting: Vascular Surgery

## 2012-05-14 ENCOUNTER — Other Ambulatory Visit (HOSPITAL_COMMUNITY): Payer: Self-pay | Admitting: Nephrology

## 2012-05-14 DIAGNOSIS — N186 End stage renal disease: Secondary | ICD-10-CM

## 2012-05-15 ENCOUNTER — Encounter (HOSPITAL_COMMUNITY): Payer: Self-pay | Admitting: Pharmacist

## 2012-05-17 ENCOUNTER — Ambulatory Visit (HOSPITAL_COMMUNITY)
Admission: RE | Admit: 2012-05-17 | Discharge: 2012-05-17 | Disposition: A | Discharge status: Home / Self Care | Payer: Medicare HMO | Source: Ambulatory Visit | Attending: Nephrology | Admitting: Nephrology

## 2012-05-17 ENCOUNTER — Encounter (HOSPITAL_COMMUNITY): Payer: Self-pay

## 2012-05-17 ENCOUNTER — Other Ambulatory Visit (HOSPITAL_COMMUNITY): Payer: Self-pay | Admitting: Nephrology

## 2012-05-17 DIAGNOSIS — Y849 Medical procedure, unspecified as the cause of abnormal reaction of the patient, or of later complication, without mention of misadventure at the time of the procedure: Secondary | ICD-10-CM | POA: Insufficient documentation

## 2012-05-17 DIAGNOSIS — T82898A Other specified complication of vascular prosthetic devices, implants and grafts, initial encounter: Secondary | ICD-10-CM | POA: Insufficient documentation

## 2012-05-17 DIAGNOSIS — G4733 Obstructive sleep apnea (adult) (pediatric): Secondary | ICD-10-CM | POA: Insufficient documentation

## 2012-05-17 DIAGNOSIS — N186 End stage renal disease: Secondary | ICD-10-CM

## 2012-05-17 DIAGNOSIS — K219 Gastro-esophageal reflux disease without esophagitis: Secondary | ICD-10-CM | POA: Insufficient documentation

## 2012-05-17 DIAGNOSIS — Z992 Dependence on renal dialysis: Secondary | ICD-10-CM | POA: Insufficient documentation

## 2012-05-17 DIAGNOSIS — N2581 Secondary hyperparathyroidism of renal origin: Secondary | ICD-10-CM | POA: Insufficient documentation

## 2012-05-17 DIAGNOSIS — E669 Obesity, unspecified: Secondary | ICD-10-CM | POA: Insufficient documentation

## 2012-05-17 DIAGNOSIS — I120 Hypertensive chronic kidney disease with stage 5 chronic kidney disease or end stage renal disease: Secondary | ICD-10-CM | POA: Insufficient documentation

## 2012-05-17 HISTORY — DX: Anemia, unspecified: D64.9

## 2012-05-17 HISTORY — DX: Secondary hyperparathyroidism of renal origin: N25.81

## 2012-05-17 MED ORDER — MIDAZOLAM HCL 2 MG/2ML IJ SOLN
INTRAMUSCULAR | Status: AC | PRN
  Administered 2012-05-17 (×2): 1 mg via INTRAVENOUS

## 2012-05-17 MED ORDER — FENTANYL CITRATE 0.05 MG/ML IJ SOLN
INTRAMUSCULAR | Status: AC | PRN
  Administered 2012-05-17 (×2): 50 ug via INTRAVENOUS

## 2012-05-17 MED ORDER — HEPARIN SODIUM (PORCINE) 1000 UNIT/ML IJ SOLN
INTRAMUSCULAR | Status: AC
  Filled 2012-05-17: qty 1

## 2012-05-17 MED ORDER — ALTEPLASE 2 MG IJ SOLR
2.0000 mg | Freq: Once | INTRAMUSCULAR | Status: DC
  Filled 2012-05-17: qty 2

## 2012-05-17 MED ORDER — FENTANYL CITRATE 0.05 MG/ML IJ SOLN
INTRAMUSCULAR | Status: AC
  Filled 2012-05-17: qty 4

## 2012-05-17 MED ORDER — HEPARIN SODIUM (PORCINE) 1000 UNIT/ML IJ SOLN
INTRAMUSCULAR | Status: AC | PRN
  Administered 2012-05-17: 3000 [IU] via INTRAVENOUS

## 2012-05-17 MED ORDER — IOHEXOL 300 MG/ML  SOLN
100.0000 mL | Freq: Once | INTRAMUSCULAR | Status: AC | PRN
  Administered 2012-05-17: 50 mL via INTRAVENOUS

## 2012-05-17 MED ORDER — MIDAZOLAM HCL 2 MG/2ML IJ SOLN
INTRAMUSCULAR | Status: AC
  Filled 2012-05-17: qty 4

## 2012-05-17 MED ORDER — ALTEPLASE 2 MG IJ SOLR
INTRAMUSCULAR | Status: AC | PRN
  Administered 2012-05-17: 2 mg

## 2012-05-17 NOTE — H&P (Signed)
Matthew Kane is an 61 y.o. male.   Chief Complaint: clotted LUA AVG noted upon HD yesterday. Still with HD catheter in place. HPI: left forearm AVG created by Dr. Garlan Fair on 04/01/12. No other intervention since placement.   Past Medical History  Diagnosis Date  . Hypertension     Echo in 10/2006-moderate LVH, normal EF  . Obstructive sleep apnea 2008    2008- uses CPAP  . Obesity   . Gout   . Gastroesophageal reflux disease   . Erectile dysfunction   . Prostatic hypertrophy, benign   . Allergic rhinitis   . Difficult intubation     Spasms after LMA inserted -12/13/10  . Chronic kidney disease, stage 5, kidney failure      dialysis at [facility] T/TH/Sat; ESRD as of 05/2011; leg cramps; hypokalemia; microcytosis  . Secondary hyperparathyroidism of renal origin   . Anemia     Past Surgical History  Procedure Date  . Total hip arthroplasty     bilateral  . Portacath placement   . Cholecystectomy   . Finger amputation     right middle  . Colonoscopy 07/12/2011    Procedure: COLONOSCOPY;  Surgeon: Arlyce Harman, MD;  [facility];  Service: Endoscopy;  Laterality: N/A;  1:00  . Av fistula placement 05-31-11    Right radiocephalic AVF  . 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 Joint replacement     times 3  . Av fistula placement 04/01/2012    Procedure: INSERTION OF ARTERIOVENOUS (AV) GORE-TEX GRAFT ARM;  Surgeon: Sherren Kerns, MD;  Location: [facility] OR;  Service: Vascular;  Laterality: Left;    Family History  Problem Relation Age of Onset  . Lung cancer Father 39    hepatic metastases  . Heart attack Mother 65  . Cancer Brother     unk   Social History:  reports that he quit smoking about 37 years ago. His smoking use included Cigarettes. He has a 10 pack-year smoking history. He has never used smokeless tobacco. He reports that he does not drink alcohol or use illicit drugs.  Allergies:  Allergies  Allergen  Reactions  . Penicillins     REACTION: Rash and itching    Review of Systems  Constitutional: Negative for fever, chills and weight loss.  Respiratory: Negative.   Cardiovascular: Negative.   Gastrointestinal: Positive for heartburn. Negative for nausea, vomiting and abdominal pain.  Genitourinary:       Esrd   Musculoskeletal: Positive for joint pain.  Skin: Negative.   Neurological: Negative.   Endo/Heme/Allergies: Negative.   Psychiatric/Behavioral: Negative.     Blood pressure 144/91, pulse 49, temperature 98.5 F (36.9 C), temperature source Oral, resp. rate 16, SpO2 98.00%. Physical Exam  Constitutional: He is oriented to person, place, and time. He appears well-developed and well-nourished. No distress.  HENT:  Head: Normocephalic and atraumatic.  Cardiovascular: Normal rate, regular rhythm and normal heart sounds.  Exam reveals no gallop and no friction rub.   No murmur heard. Respiratory: Effort normal and breath sounds normal. No respiratory distress. He has no wheezes. He has no rales.  GI: Soft. Bowel sounds are normal. He exhibits no distension. There is no tenderness.  Musculoskeletal: Normal range of motion. He exhibits no edema.       Left forearm AVG intact - 1+ pulse. No thrill noted. No edema, hematoma, slight laceration over pseudoaneurysm.  Non-tender  Neurological: He is alert and oriented to person, place, and time.  Skin: Skin is warm and dry.  Psychiatric: He has a normal mood and affect. His behavior is normal. Judgment and thought content normal.     Assessment/Plan Declot procedure discussed in detail with patient with his apparent understanding. Risks include but not limited to infection, bleeding, vessel damage, contrast reaction, and complications with moderate sedation as well as possible unsuccessful declot of his graft  Patient's questions answered to his satisfaction. Written consent obtained.   CAMPBELL,PAMELA D 05/17/2012, 8:07  AM

## 2012-05-17 NOTE — Procedures (Signed)
LUE AVG thrombectomy PTA 7mm No comp

## 2012-05-17 NOTE — ED Notes (Addendum)
Unable to print of discharge instructions, however, I verbally reviewed discharge instructions with Matthew Kane and [name].  Both acknowledged understanding of discharge instructions.

## 2012-05-17 NOTE — Progress Notes (Signed)
After procedure, pt was taken to his vehicle, via wheelchair and placed in passenger side of vehicle.  Waited outside to verify that pt wasn't going to attempt to drive but Matthew Kane and Matthew Kane made no movement.  I went inside and watched out the window in which I noticed both passenger and driver side doors open and Matthew Kane sitting in driver side of vehicle.  I ran outside and approached pt and Matthew Kane and explained once again, that pt was not allowed to drive.  Informed pt that he had conscious sedation and he was not mentally sound to drive.  Pt got into passenger side of vehicle and I watched them leave.

## 2012-05-18 ENCOUNTER — Other Ambulatory Visit (HOSPITAL_COMMUNITY): Payer: Self-pay | Admitting: Nephrology

## 2012-05-18 DIAGNOSIS — N186 End stage renal disease: Secondary | ICD-10-CM

## 2012-05-20 ENCOUNTER — Telehealth (HOSPITAL_COMMUNITY): Payer: Self-pay | Admitting: *Deleted

## 2012-05-21 ENCOUNTER — Ambulatory Visit (HOSPITAL_COMMUNITY)
Admission: RE | Admit: 2012-05-21 | Discharge: 2012-05-21 | Disposition: A | Discharge status: Home / Self Care | Payer: Medicare HMO | Source: Ambulatory Visit | Attending: Nephrology | Admitting: Nephrology

## 2012-05-21 ENCOUNTER — Other Ambulatory Visit (HOSPITAL_COMMUNITY): Payer: Self-pay | Admitting: Nephrology

## 2012-05-21 DIAGNOSIS — N186 End stage renal disease: Secondary | ICD-10-CM

## 2012-05-21 DIAGNOSIS — M109 Gout, unspecified: Secondary | ICD-10-CM | POA: Insufficient documentation

## 2012-05-21 DIAGNOSIS — N2581 Secondary hyperparathyroidism of renal origin: Secondary | ICD-10-CM | POA: Insufficient documentation

## 2012-05-21 DIAGNOSIS — T82898A Other specified complication of vascular prosthetic devices, implants and grafts, initial encounter: Secondary | ICD-10-CM | POA: Insufficient documentation

## 2012-05-21 DIAGNOSIS — I120 Hypertensive chronic kidney disease with stage 5 chronic kidney disease or end stage renal disease: Secondary | ICD-10-CM | POA: Insufficient documentation

## 2012-05-21 DIAGNOSIS — E669 Obesity, unspecified: Secondary | ICD-10-CM | POA: Insufficient documentation

## 2012-05-21 DIAGNOSIS — J309 Allergic rhinitis, unspecified: Secondary | ICD-10-CM | POA: Insufficient documentation

## 2012-05-21 DIAGNOSIS — Y832 Surgical operation with anastomosis, bypass or graft as the cause of abnormal reaction of the patient, or of later complication, without mention of misadventure at the time of the procedure: Secondary | ICD-10-CM | POA: Insufficient documentation

## 2012-05-21 DIAGNOSIS — K219 Gastro-esophageal reflux disease without esophagitis: Secondary | ICD-10-CM | POA: Insufficient documentation

## 2012-05-21 DIAGNOSIS — G4733 Obstructive sleep apnea (adult) (pediatric): Secondary | ICD-10-CM | POA: Insufficient documentation

## 2012-05-21 DIAGNOSIS — Z992 Dependence on renal dialysis: Secondary | ICD-10-CM | POA: Insufficient documentation

## 2012-05-21 DIAGNOSIS — Z96649 Presence of unspecified artificial hip joint: Secondary | ICD-10-CM | POA: Insufficient documentation

## 2012-05-21 MED ORDER — ALTEPLASE 2 MG IJ SOLR
2.0000 mg | Freq: Once | INTRAMUSCULAR | Status: DC
  Filled 2012-05-21: qty 2

## 2012-05-21 MED ORDER — HEPARIN SODIUM (PORCINE) 1000 UNIT/ML IJ SOLN
INTRAMUSCULAR | Status: AC | PRN
  Administered 2012-05-21: 3000 [IU] via INTRAVENOUS
  Administered 2012-05-21: 2000 [IU] via INTRAVENOUS

## 2012-05-21 MED ORDER — HEPARIN SODIUM (PORCINE) 1000 UNIT/ML IJ SOLN
INTRAMUSCULAR | Status: AC
  Filled 2012-05-21: qty 1

## 2012-05-21 MED ORDER — IOHEXOL 300 MG/ML  SOLN
80.0000 mL | Freq: Once | INTRAMUSCULAR | Status: AC | PRN
  Administered 2012-05-21: 80 mL via INTRAVENOUS

## 2012-05-21 MED ORDER — ALTEPLASE 100 MG IV SOLR
INTRAVENOUS | Status: AC | PRN
  Administered 2012-05-21: 2 mg

## 2012-05-21 NOTE — H&P (Signed)
Chief Complaint: Clotted (L)FA AVG Referring Physician:Coladonato HPI: Matthew Kane is an 61 y.o. male with ESRD. Just had declot and PTA 4 days ago. Was reclotted by the next day. Had HD via PC. Back for another declot procedure. No changes to PMHx hx. He is here by himself and states he will not require sedation meds.  Past Medical History:  Past Medical History  Diagnosis Date  . Hypertension     Echo in 10/2006-moderate LVH, normal EF  . Obstructive sleep apnea 2008    2008- uses CPAP  . Obesity   . Gout   . Gastroesophageal reflux disease   . Erectile dysfunction   . Prostatic hypertrophy, benign   . Allergic rhinitis   . Difficult intubation     Spasms after LMA inserted -12/13/10  . Chronic kidney disease, stage 5, kidney failure      dialysis at [facility] T/TH/Sat; ESRD as of 05/2011; leg cramps; hypokalemia; microcytosis  . Secondary hyperparathyroidism of renal origin   . Anemia     Past Surgical History:  Past Surgical History  Procedure Date  . Total hip arthroplasty     bilateral  . Portacath placement   . Cholecystectomy   . Finger amputation     right middle  . Colonoscopy 07/12/2011    Procedure: COLONOSCOPY;  Surgeon: Arlyce Harman, MD;  [facility]: [facility];  Service: Endoscopy;  Laterality: N/A;  1:00  . Av fistula placement 05-31-11    Right radiocephalic AVF  . 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 Joint replacement     times 3  . Av fistula placement 04/01/2012    Procedure: INSERTION OF ARTERIOVENOUS (AV) GORE-TEX GRAFT ARM;  Surgeon: Sherren Kerns, MD;  Location: [facility] OR;  Service: Vascular;  Laterality: Left;    Family History:  Family History  Problem Relation Age of Onset  . Lung cancer Father 49    hepatic metastases  . Heart attack Mother 66  . Cancer Brother     unk    Social History:  reports that he quit smoking about 37 years ago. His smoking use included Cigarettes. He has a  10 pack-year smoking history. He has never used smokeless tobacco. He reports that he does not drink alcohol or use illicit drugs.  Allergies:  Allergies  Allergen Reactions  . Penicillins     REACTION: Rash and itching    Medications: amLODipine (NORVASC) 10 MG tablet (Taking) Sig - Route: Take 10 mg by mouth every morning. - Oral Class: Historical Med Number of times this order has been changed since signing: 5 Order [address] aspirin 81 MG chewable tablet (Taking) Sig - Route: Chew 81 mg by mouth every morning. - Oral Class: Historical Med Number of times this order has been changed since signing: 3 Order [address] b complex-vitamin c-folic acid (NEPHRO-VITE) 0.8 MG TABS (Taking) 11/13/2011 Sig - Route: Take 0.8 mg by mouth every morning. - Oral Class: Historical Med Number of times this order has been changed since signing: 4 Order [address] calcium acetate (PHOSLO) 667 MG capsule (Taking) 11/28/2011 Sig - Route: Take 667-2,001 mg by mouth 3 (three) times daily with meals. 3 caps with breakfast, 1 cap with lunch, 1 cap with dinner - Oral Class: Historical Med Number of times this order has been changed since signing: 3 Order [address] cloNIDine (CATAPRES) 0.3 MG tablet (Taking) Sig - Route: Take 0.3 mg  by mouth 2 (two) times daily. - Oral Class: Historical Med Number of times this order has been changed since signing: 1 Order [address] metoprolol succinate (TOPROL-XL) 50 MG 24 hr tablet (Taking) Sig - Route: Take 50 mg by mouth every morning. Take with or immediately following a meal. - Oral Class: Historical Med Number of times this order has been changed since signing: 1 Order [address] SENSIPAR 30 MG tablet (Taking) 09/17/2011 Sig - Route: Take 30 mg by mouth every morning. - Oral Class: Historical Med   Please HPI for pertinent positives, otherwise complete 10 system ROS negative.  Physical Exam: There were no vitals taken for this visit. There is no height or weight on file to  calculate BMI.   General Appearance:  Alert, cooperative, no distress, appears stated age  Head:  Normocephalic, without obvious abnormality, atraumatic  ENT: Unremarkable  Neck: Supple, symmetrical, trachea midline, no adenopathy, thyroid: not enlarged, symmetric, no tenderness/mass/nodules  Lungs:   Clear to auscultation bilaterally, no w/r/r, respirations unlabored without use of accessory muscles.  Chest Wall:  No tenderness or deformity  Heart:  Regular rate and rhythm, S1, S2 normal, no murmur, rub or gallop. Carotids 2+ without bruit.  Abdomen:   Soft, non-tender, non distended. Bowel sounds active all four quadrants,  no masses, no organomegaly.  Extremities: (L)FA AVG without pulse. Good radial pulse and brachial, hand warm.  Neurologic: Normal affect, no gross deficits.   No results found for this or any previous visit (from the past 48 hour(s)). No results found.  Assessment/Plan ESRD Thrombosed (L)AVG Declot procedure discussed in detail with patient with his apparent understanding. Risks include but not limited to infection, bleeding, vessel damage, contrast reaction, and complications with moderate sedation as well as possible unsuccessful declot of his graft. Patient's questions answered to his satisfaction. Written consent obtained.    Brayton El PA-C 05/21/2012, 8:25 AM

## 2012-05-21 NOTE — ED Notes (Signed)
Activase 2 mg ordered from pharm, spoke with Homero Fellers to verify.  To be tubed to 17

## 2012-05-21 NOTE — ED Notes (Signed)
No sedation today, pt driving self home.

## 2012-05-21 NOTE — ED Notes (Signed)
Pt tolerated procedure fairly well without sedation.

## 2012-05-21 NOTE — Procedures (Signed)
Successful declot of left arm forearm graft.  Critical stenosis at venous anastomosis was treated with balloon angioplasty.  Small multiple areas of leaking along the arterial limb of graft related to hematoma at elbow based on ultrasound.  Will need to monitor this hematoma.  See Radiology report.

## 2012-05-22 ENCOUNTER — Telehealth (HOSPITAL_COMMUNITY): Payer: Self-pay | Admitting: *Deleted

## 2012-05-22 NOTE — Telephone Encounter (Signed)
Post radiology procedure phone call attempted.  Left message on identified answering machine to call for any problems or questions.

## 2012-05-23 ENCOUNTER — Ambulatory Visit: Payer: Medicare HMO | Admitting: Vascular Surgery

## 2012-06-21 ENCOUNTER — Ambulatory Visit: Payer: Medicare HMO | Admitting: Vascular Surgery

## 2012-07-11 ENCOUNTER — Encounter: Payer: Self-pay | Admitting: Vascular Surgery

## 2012-07-12 ENCOUNTER — Ambulatory Visit (INDEPENDENT_AMBULATORY_CARE_PROVIDER_SITE_OTHER): Payer: Medicare HMO | Admitting: Vascular Surgery

## 2012-07-12 ENCOUNTER — Encounter (INDEPENDENT_AMBULATORY_CARE_PROVIDER_SITE_OTHER): Payer: Medicare HMO | Admitting: *Deleted

## 2012-07-12 ENCOUNTER — Encounter: Payer: Self-pay | Admitting: Vascular Surgery

## 2012-07-12 VITALS — BP 135/83 | HR 71 | Ht 72.0 in | Wt 214.0 lb

## 2012-07-12 DIAGNOSIS — T82598A Other mechanical complication of other cardiac and vascular devices and implants, initial encounter: Secondary | ICD-10-CM

## 2012-07-12 DIAGNOSIS — N186 End stage renal disease: Secondary | ICD-10-CM

## 2012-07-12 DIAGNOSIS — T82898A Other specified complication of vascular prosthetic devices, implants and grafts, initial encounter: Secondary | ICD-10-CM

## 2012-07-12 DIAGNOSIS — T888XXA Other specified complications of surgical and medical care, not elsewhere classified, initial encounter: Secondary | ICD-10-CM

## 2012-07-12 NOTE — Progress Notes (Signed)
[facility]   Postoperative Access Visit  History of Present Illness  Matthew Kane is a 61 y.o. year old male who presents for postoperative follow-up for: L FA AVG by Dr. Darrick Penna (Date: 04/01/12).  The patient's wounds are healed.  The patient notes no steal symptoms.  The patient is able to complete their activities of daily living.  The patient's current symptoms are: none.  Physical Examination  Filed Vitals:   07/12/12 0852  BP: 135/83  Pulse: 71   LUE: Incision is healed, skin feels warm, hand grip is 5/5, sensation in digits is  intact, palpable thrill, bruit can be auscultated, pulsatile swelling at arterial anastomosis L access duplex (Date: 07/12/12)  Large Hematoma (3 cm x 4 cm) without any arterial flow compressing graft  Medical Decision Making  Matthew Kane is a 61 y.o. year old male who presents s/p L FA AVG complicated by possible antecubital hematoma  Access duplex ordered to evaluate for possible PSA at the arterial anastomosis:  Findings as listed above.  Surgically draining the hematoma may result in contamination of the graft, so I don't recommend doing such.  If this is indeed hematoma, it should liquidfy and resolve with time.  There is a possibility the antecubital mass is due to a Goretex reaction, in which case, the mass is unlikely to resolve with time.  Unfortunately, only time will let us known which is which.  Regardless, The patient's access is ready for use now.  If flow rates are not adequate, evacuation of the antecubital mass  The patient's tunneled dialysis catheter can be removed after two successful cannulations and completed dialysis treatments.  Thank you for allowing [place] to participate in this patient's care.  Leonides Sake, MD [facility] [place] Office: [phone] Pager: [phone]

## 2012-07-24 ENCOUNTER — Other Ambulatory Visit (HOSPITAL_COMMUNITY): Payer: Self-pay | Admitting: Nephrology

## 2012-07-24 DIAGNOSIS — N186 End stage renal disease: Secondary | ICD-10-CM

## 2012-07-26 ENCOUNTER — Ambulatory Visit (HOSPITAL_COMMUNITY)
Admission: RE | Admit: 2012-07-26 | Discharge: 2012-07-26 | Disposition: A | Discharge status: Home / Self Care | Payer: Medicare HMO | Source: Ambulatory Visit | Attending: Nephrology | Admitting: Nephrology

## 2012-07-26 ENCOUNTER — Encounter (HOSPITAL_COMMUNITY): Payer: Self-pay | Admitting: Emergency Medicine

## 2012-07-26 ENCOUNTER — Emergency Department (HOSPITAL_COMMUNITY)
Admission: EM | Admit: 2012-07-26 | Discharge: 2012-07-26 | Disposition: A | Discharge status: Home / Self Care | Payer: Medicare HMO | Attending: Emergency Medicine | Admitting: Emergency Medicine

## 2012-07-26 VITALS — BP 140/85 | HR 72

## 2012-07-26 DIAGNOSIS — R079 Chest pain, unspecified: Secondary | ICD-10-CM | POA: Insufficient documentation

## 2012-07-26 DIAGNOSIS — Z7982 Long term (current) use of aspirin: Secondary | ICD-10-CM | POA: Insufficient documentation

## 2012-07-26 DIAGNOSIS — E669 Obesity, unspecified: Secondary | ICD-10-CM | POA: Insufficient documentation

## 2012-07-26 DIAGNOSIS — Z87448 Personal history of other diseases of urinary system: Secondary | ICD-10-CM | POA: Insufficient documentation

## 2012-07-26 DIAGNOSIS — Z452 Encounter for adjustment and management of vascular access device: Secondary | ICD-10-CM | POA: Insufficient documentation

## 2012-07-26 DIAGNOSIS — N186 End stage renal disease: Secondary | ICD-10-CM | POA: Insufficient documentation

## 2012-07-26 DIAGNOSIS — S20219A Contusion of unspecified front wall of thorax, initial encounter: Secondary | ICD-10-CM

## 2012-07-26 DIAGNOSIS — Z8709 Personal history of other diseases of the respiratory system: Secondary | ICD-10-CM | POA: Insufficient documentation

## 2012-07-26 DIAGNOSIS — N401 Enlarged prostate with lower urinary tract symptoms: Secondary | ICD-10-CM | POA: Insufficient documentation

## 2012-07-26 DIAGNOSIS — Z79899 Other long term (current) drug therapy: Secondary | ICD-10-CM | POA: Insufficient documentation

## 2012-07-26 DIAGNOSIS — Y939 Activity, unspecified: Secondary | ICD-10-CM | POA: Insufficient documentation

## 2012-07-26 DIAGNOSIS — Z862 Personal history of diseases of the blood and blood-forming organs and certain disorders involving the immune mechanism: Secondary | ICD-10-CM | POA: Insufficient documentation

## 2012-07-26 DIAGNOSIS — N185 Chronic kidney disease, stage 5: Secondary | ICD-10-CM | POA: Insufficient documentation

## 2012-07-26 DIAGNOSIS — Y929 Unspecified place or not applicable: Secondary | ICD-10-CM | POA: Insufficient documentation

## 2012-07-26 DIAGNOSIS — N2581 Secondary hyperparathyroidism of renal origin: Secondary | ICD-10-CM | POA: Insufficient documentation

## 2012-07-26 DIAGNOSIS — N138 Other obstructive and reflux uropathy: Secondary | ICD-10-CM | POA: Insufficient documentation

## 2012-07-26 DIAGNOSIS — X58XXXA Exposure to other specified factors, initial encounter: Secondary | ICD-10-CM | POA: Insufficient documentation

## 2012-07-26 DIAGNOSIS — S8780XA Crushing injury of unspecified lower leg, initial encounter: Secondary | ICD-10-CM | POA: Insufficient documentation

## 2012-07-26 DIAGNOSIS — Z8719 Personal history of other diseases of the digestive system: Secondary | ICD-10-CM | POA: Insufficient documentation

## 2012-07-26 DIAGNOSIS — Z87891 Personal history of nicotine dependence: Secondary | ICD-10-CM | POA: Insufficient documentation

## 2012-07-26 DIAGNOSIS — I129 Hypertensive chronic kidney disease with stage 1 through stage 4 chronic kidney disease, or unspecified chronic kidney disease: Secondary | ICD-10-CM | POA: Insufficient documentation

## 2012-07-26 DIAGNOSIS — G4733 Obstructive sleep apnea (adult) (pediatric): Secondary | ICD-10-CM | POA: Insufficient documentation

## 2012-07-26 DIAGNOSIS — Z8639 Personal history of other endocrine, nutritional and metabolic disease: Secondary | ICD-10-CM | POA: Insufficient documentation

## 2012-07-26 MED ORDER — OXYCODONE-ACETAMINOPHEN 5-325 MG PO TABS
2.0000 | ORAL_TABLET | Freq: Once | ORAL | Status: AC
  Administered 2012-07-26: 2 via ORAL
  Filled 2012-07-26: qty 2

## 2012-07-26 MED ORDER — OXYCODONE-ACETAMINOPHEN 5-325 MG PO TABS: 1.0000 | ORAL_TABLET | ORAL | Status: AC | PRN

## 2012-07-26 MED ORDER — CHLORHEXIDINE GLUCONATE 4 % EX LIQD
CUTANEOUS | Status: AC
  Filled 2012-07-26: qty 45

## 2012-07-26 NOTE — ED Notes (Signed)
Patient reports had fistula taken out today at approximately 1500. Reports has no prescription for pain, complaining of shoulder pain at site.

## 2012-07-26 NOTE — Progress Notes (Signed)
Site unchanged Dr Lowella Dandy aware, pt discharged home with ice pack and wife

## 2012-07-26 NOTE — Progress Notes (Signed)
Dressing to R [place] D/I slightly edematous

## 2012-07-26 NOTE — ED Notes (Addendum)
Pt has IV access to rt upper chest,removed today.   swelling noted and pt c/o pain.  Seen at [facility] today.Good  Rt radial pulse

## 2012-07-26 NOTE — Procedures (Signed)
Removal of right chest tunneled dialysis catheter.  No immediate complication.

## 2012-07-28 NOTE — ED Provider Notes (Signed)
History     CSN: [id number]  Arrival date & time 07/26/12  1850   First MD Initiated Contact with Patient 07/26/12 2009      Chief Complaint  Patient presents with  . Shoulder Pain    (Consider location/radiation/quality/duration/timing/severity/associated sxs/prior treatment) HPI Comments: Matthew Kane presents with pain in his right upper chest surrounding the site of his dialysis portacath which was removed this afternoon as he now has a working fistula in his right arm. He states there was swelling surrounding the portacath site similar to current swelling, but pain has increased since arriving home.  He has not applied ice or heat, nor has he taken any pain medicines prior to arrival.  He denies shortness of breath,  Radiation of pain, specifically no pain in his neck.    The history is provided by the patient.    Past Medical History  Diagnosis Date  . Hypertension     Echo in 10/2006-moderate LVH, normal EF  . Obstructive sleep apnea 2008    2008- uses CPAP  . Obesity   . Gout   . Gastroesophageal reflux disease   . Erectile dysfunction   . Prostatic hypertrophy, benign   . Allergic rhinitis   . Difficult intubation     Spasms after LMA inserted -12/13/10  . Chronic kidney disease, stage 5, kidney failure      dialysis at [facility] T/TH/Sat; ESRD as of 05/2011; leg cramps; hypokalemia; microcytosis  . Secondary hyperparathyroidism of renal origin   . Anemia     Past Surgical History  Procedure Date  . Total hip arthroplasty     bilateral  . Portacath placement   . Cholecystectomy   . Finger amputation     right middle  . Colonoscopy 07/12/2011    Procedure: COLONOSCOPY;  Surgeon: Arlyce Harman, MD;  Location: [facility];  Service: Endoscopy;  Laterality: N/A;  1:00  . Av fistula placement 05-31-11    Right radiocephalic AVF  . 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 Joint replacement     times 3  . Av  fistula placement 04/01/2012    Procedure: INSERTION OF ARTERIOVENOUS (AV) GORE-TEX GRAFT ARM;  Surgeon: Sherren Kerns, MD;  Location: [facility] OR;  Service: Vascular;  Laterality: Left;    Family History  Problem Relation Age of Onset  . Lung cancer Father 85    hepatic metastases  . Heart attack Mother 79  . Cancer Brother     unk    History  Substance Use Topics  . Smoking status: Former Smoker -- 1.0 packs/day for 10 years    Types: Cigarettes    Quit date: 01/11/1975  . Smokeless tobacco: Never Used  . Alcohol Use: No      Review of Systems  Constitutional: Negative for fever.  HENT: Negative for congestion, sore throat and neck pain.   Eyes: Negative.   Respiratory: Negative for chest tightness, shortness of breath, wheezing and stridor.   Cardiovascular: Positive for chest pain.  Gastrointestinal: Negative for nausea and abdominal pain.  Genitourinary: Negative.   Musculoskeletal: Negative for joint swelling and arthralgias.  Skin: Negative.  Negative for rash and wound.  Neurological: Negative for dizziness, weakness, light-headedness, numbness and headaches.  Hematological: Negative.   Psychiatric/Behavioral: Negative.     Allergies  Penicillins  Home Medications   Current Outpatient Rx  Name  Route  Sig  Dispense  Refill  .  AMLODIPINE BESYLATE 10 MG PO TABS   Oral   Take 10 mg by mouth every morning.          . ASPIRIN 81 MG PO CHEW   Oral   Chew 81 mg by mouth every morning.          Marland Kitchen NEPHRO-VITE 0.8 MG PO TABS   Oral   Take 0.8 mg by mouth every morning.          Marland Kitchen CALCIUM ACETATE 667 MG PO CAPS   Oral   Take 667-2,001 mg by mouth 3 (three) times daily with meals. 3 caps with breakfast, 1 cap with lunch, 1 cap with dinner         . CLONIDINE HCL 0.3 MG PO TABS   Oral   Take 0.3 mg by mouth 2 (two) times daily.         Marland Kitchen METOPROLOL TARTRATE 50 MG PO TABS               . METOPROLOL SUCCINATE ER 50 MG PO TB24   Oral   Take 50  mg by mouth every morning. Take with or immediately following a meal.         . RENA-VITE PO TABS               . NAPROXEN SODIUM 220 MG PO TABS   Oral   Take 220 mg by mouth daily as needed. For back pain         . OXYCODONE HCL 5 MG PO TABS               . OXYCODONE-ACETAMINOPHEN 5-325 MG PO TABS   Oral   Take 1 tablet by mouth every 4 (four) hours as needed for pain.   30 tablet   0   . SENSIPAR 30 MG PO TABS   Oral   Take 30 mg by mouth every morning.            BP 186/98  Pulse 71  Temp 97.6 F (36.4 C) (Oral)  Resp 20  Ht 6' (1.829 m)  Wt 218 lb (98.884 kg)  BMI 29.57 kg/m2  SpO2 100%  Physical Exam  Constitutional: He is oriented to person, place, and time. He appears well-developed and well-nourished.  HENT:  Head: Normocephalic.  Cardiovascular: Normal rate.   Pulmonary/Chest: Effort normal. He exhibits tenderness.       TTP with mild edema surrounding catheter site right upper chest wall.  No pain or swelling along right neck or shoulder.    Musculoskeletal: He exhibits tenderness.  Neurological: He is alert and oriented to person, place, and time. No sensory deficit.  Skin: No ecchymosis noted.    ED Course  Procedures (including critical care time)  Labs Reviewed - No data to display No results found.   1. Hematoma of chest wall       MDM  Pt with localized hematoma surrounding catheter site.  No neck pain or swelling,  No sob.  Pt encouraged to use ice packs,  Oxycodone prescribed for pain relief.  F/u for recheck if sx worsened or not improved over the next several days.  He does have dialysis in am.        Burgess Amor, [place] 07/28/12 2333

## 2012-07-29 ENCOUNTER — Telehealth (HOSPITAL_COMMUNITY): Payer: Self-pay | Admitting: *Deleted

## 2012-07-29 NOTE — ED Provider Notes (Signed)
Medical screening examination/treatment/procedure(s) were performed by non-physician practitioner and as supervising physician I was immediately available for consultation/collaboration.  Geoffery Lyons, MD 07/29/12 2245

## 2012-11-13 ENCOUNTER — Emergency Department (HOSPITAL_COMMUNITY)
Admission: EM | Admit: 2012-11-13 | Discharge: 2012-11-13 | Disposition: A | Discharge status: Home / Self Care | Payer: Medicare HMO | Attending: Emergency Medicine | Admitting: Emergency Medicine

## 2012-11-13 ENCOUNTER — Encounter (HOSPITAL_COMMUNITY): Payer: Self-pay

## 2012-11-13 DIAGNOSIS — N400 Enlarged prostate without lower urinary tract symptoms: Secondary | ICD-10-CM | POA: Insufficient documentation

## 2012-11-13 DIAGNOSIS — Z87891 Personal history of nicotine dependence: Secondary | ICD-10-CM | POA: Insufficient documentation

## 2012-11-13 DIAGNOSIS — J309 Allergic rhinitis, unspecified: Secondary | ICD-10-CM | POA: Insufficient documentation

## 2012-11-13 DIAGNOSIS — N529 Male erectile dysfunction, unspecified: Secondary | ICD-10-CM | POA: Insufficient documentation

## 2012-11-13 DIAGNOSIS — I120 Hypertensive chronic kidney disease with stage 5 chronic kidney disease or end stage renal disease: Secondary | ICD-10-CM | POA: Insufficient documentation

## 2012-11-13 DIAGNOSIS — Z79899 Other long term (current) drug therapy: Secondary | ICD-10-CM | POA: Insufficient documentation

## 2012-11-13 DIAGNOSIS — M109 Gout, unspecified: Secondary | ICD-10-CM | POA: Insufficient documentation

## 2012-11-13 DIAGNOSIS — E669 Obesity, unspecified: Secondary | ICD-10-CM | POA: Insufficient documentation

## 2012-11-13 DIAGNOSIS — N509 Disorder of male genital organs, unspecified: Secondary | ICD-10-CM | POA: Insufficient documentation

## 2012-11-13 DIAGNOSIS — N50819 Testicular pain, unspecified: Secondary | ICD-10-CM

## 2012-11-13 DIAGNOSIS — D649 Anemia, unspecified: Secondary | ICD-10-CM | POA: Insufficient documentation

## 2012-11-13 DIAGNOSIS — N2581 Secondary hyperparathyroidism of renal origin: Secondary | ICD-10-CM | POA: Insufficient documentation

## 2012-11-13 DIAGNOSIS — N451 Epididymitis: Secondary | ICD-10-CM

## 2012-11-13 DIAGNOSIS — Z880 Allergy status to penicillin: Secondary | ICD-10-CM | POA: Insufficient documentation

## 2012-11-13 DIAGNOSIS — G4733 Obstructive sleep apnea (adult) (pediatric): Secondary | ICD-10-CM | POA: Insufficient documentation

## 2012-11-13 DIAGNOSIS — Z7982 Long term (current) use of aspirin: Secondary | ICD-10-CM | POA: Insufficient documentation

## 2012-11-13 DIAGNOSIS — N185 Chronic kidney disease, stage 5: Secondary | ICD-10-CM | POA: Insufficient documentation

## 2012-11-13 DIAGNOSIS — K219 Gastro-esophageal reflux disease without esophagitis: Secondary | ICD-10-CM | POA: Insufficient documentation

## 2012-11-13 MED ORDER — CIPROFLOXACIN HCL 500 MG PO TABS: 500.0000 mg | ORAL_TABLET | Freq: Two times a day (BID) | ORAL | Status: DC

## 2012-11-13 MED ORDER — HYDROCODONE-ACETAMINOPHEN 5-325 MG PO TABS: 2.0000 | ORAL_TABLET | ORAL | Status: DC | PRN

## 2012-11-13 NOTE — ED Notes (Signed)
Complain of groin pain and swelling

## 2012-11-13 NOTE — ED Notes (Signed)
Discharge instructions given and reviewed with patient.  Prescriptions given for Cipro and Vicodin; effects and use explained.  Patient verbalized understanding to complete all Cipro and sedating effects of Vicodin.  Patient ambulatory; discharged home in good condition.

## 2012-11-13 NOTE — ED Provider Notes (Signed)
History     CSN: [id number]  Arrival date & time 11/13/12  1839   First MD Initiated Contact with Patient 11/13/12 1851      Chief Complaint  Patient presents with  . Groin Swelling    (Consider location/radiation/quality/duration/timing/severity/associated sxs/prior treatment) HPI Comments: Patient presents here with a two day history of pain in the right testicle.  It started gradually and has worsened as time has gone on.  He denies any injury or trauma.  No fevers or chills.  No abd pain.  He is a dialysis patient and makes very little urine.    The history is provided by the patient.    Past Medical History  Diagnosis Date  . Hypertension     Echo in 10/2006-moderate LVH, normal EF  . Obstructive sleep apnea 2008    2008- uses CPAP  . Obesity   . Gout   . Gastroesophageal reflux disease   . Erectile dysfunction   . Prostatic hypertrophy, benign   . Allergic rhinitis   . Difficult intubation     Spasms after LMA inserted -12/13/10  . Chronic kidney disease, stage 5, kidney failure      dialysis at [facility] T/TH/Sat; ESRD as of 05/2011; leg cramps; hypokalemia; microcytosis  . Secondary hyperparathyroidism of renal origin   . Anemia     Past Surgical History  Procedure Laterality Date  . Total hip arthroplasty      bilateral  . Portacath placement    . Cholecystectomy    . Finger amputation      right middle  . Colonoscopy  07/12/2011    Procedure: COLONOSCOPY;  Surgeon: Arlyce Harman, MD;  [facility]: [facility];  Service: Endoscopy;  Laterality: N/A;  1:00  . Av fistula placement  05-31-11    Right radiocephalic AVF  . 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 Joint replacement      times 3  . Av fistula placement  04/01/2012    Procedure: INSERTION OF ARTERIOVENOUS (AV) GORE-TEX GRAFT ARM;  Surgeon: Sherren Kerns, MD;  Location: [facility] OR;  Service: Vascular;  Laterality: Left;    Family History  Problem  Relation Age of Onset  . Lung cancer Father 34    hepatic metastases  . Heart attack Mother 39  . Cancer Brother     unk    History  Substance Use Topics  . Smoking status: Former Smoker -- 1.00 packs/day for 10 years    Types: Cigarettes    Quit date: 01/11/1975  . Smokeless tobacco: Never Used  . Alcohol Use: No      Review of Systems  All other systems reviewed and are negative.    Allergies  Penicillins  Home Medications   Current Outpatient Rx  Name  Route  Sig  Dispense  Refill  . amLODipine (NORVASC) 10 MG tablet   Oral   Take 10 mg by mouth every morning.          Marland Kitchen aspirin 81 MG chewable tablet   Oral   Chew 81 mg by mouth every morning.          Marland Kitchen b complex-vitamin c-folic acid (NEPHRO-VITE) 0.8 MG TABS   Oral   Take 0.8 mg by mouth every morning.          . calcium acetate (PHOSLO) 667 MG capsule   Oral   Take 667-2,001 mg by mouth 3 (three) times  daily with meals. 3 caps with breakfast, 1 cap with lunch, 1 cap with dinner         . cloNIDine (CATAPRES) 0.3 MG tablet   Oral   Take 0.3 mg by mouth 2 (two) times daily.         . metoprolol succinate (TOPROL-XL) 50 MG 24 hr tablet   Oral   Take 50 mg by mouth every morning. Take with or immediately following a meal.         . SENSIPAR 30 MG tablet   Oral   Take 30 mg by mouth every morning.          . naproxen sodium (ANAPROX) 220 MG tablet   Oral   Take 220 mg by mouth daily as needed. For back pain           BP 156/96  Pulse 66  Temp(Src) 97.1 F (36.2 C) (Oral)  Resp 20  Ht 6' (1.829 m)  Wt 216 lb (97.977 kg)  BMI 29.29 kg/m2  SpO2 100%  Physical Exam  Nursing note and vitals reviewed. Constitutional: He is oriented to person, place, and time. He appears well-developed and well-nourished. No distress.  HENT:  Head: Normocephalic and atraumatic.  Mouth/Throat: Oropharynx is clear and moist.  Neck: Normal range of motion. Neck supple.  Abdominal: Soft. Bowel  sounds are normal. He exhibits no distension. There is no tenderness.  Genitourinary: Penis normal.  There is ttp of the left testicle, particular the posterior aspect.  The testicle is freely mobile, but with pain.  I am unable to palpate an inguinal hernia.    Musculoskeletal: Normal range of motion.  Neurological: He is alert and oriented to person, place, and time.  Skin: Skin is warm and dry. He is not diaphoretic.    ED Course  Procedures (including critical care time)  Labs Reviewed - No data to display No results found.   No diagnosis found.    MDM  I suspect this is epididymitis.  The testicle is mobile and I strongly doubt a torsion.  [place] is gone for the night.  I will treat with cipro and pain meds, arrange an [place] for tomorrow.          Geoffery Lyons, MD 11/13/12 [phone]

## 2013-06-27 ENCOUNTER — Encounter (HOSPITAL_COMMUNITY): Payer: Self-pay | Admitting: Emergency Medicine

## 2013-06-27 ENCOUNTER — Emergency Department (HOSPITAL_COMMUNITY)
Admission: EM | Admit: 2013-06-27 | Discharge: 2013-06-28 | Disposition: A | Discharge status: Home / Self Care | Payer: Medicare HMO | Attending: Emergency Medicine | Admitting: Emergency Medicine

## 2013-06-27 DIAGNOSIS — Z87891 Personal history of nicotine dependence: Secondary | ICD-10-CM | POA: Insufficient documentation

## 2013-06-27 DIAGNOSIS — Z87448 Personal history of other diseases of urinary system: Secondary | ICD-10-CM | POA: Insufficient documentation

## 2013-06-27 DIAGNOSIS — Z79899 Other long term (current) drug therapy: Secondary | ICD-10-CM | POA: Insufficient documentation

## 2013-06-27 DIAGNOSIS — Z880 Allergy status to penicillin: Secondary | ICD-10-CM | POA: Insufficient documentation

## 2013-06-27 DIAGNOSIS — Z862 Personal history of diseases of the blood and blood-forming organs and certain disorders involving the immune mechanism: Secondary | ICD-10-CM | POA: Insufficient documentation

## 2013-06-27 DIAGNOSIS — Z789 Other specified health status: Secondary | ICD-10-CM | POA: Insufficient documentation

## 2013-06-27 DIAGNOSIS — M109 Gout, unspecified: Secondary | ICD-10-CM | POA: Insufficient documentation

## 2013-06-27 DIAGNOSIS — Z992 Dependence on renal dialysis: Secondary | ICD-10-CM | POA: Insufficient documentation

## 2013-06-27 DIAGNOSIS — E669 Obesity, unspecified: Secondary | ICD-10-CM | POA: Insufficient documentation

## 2013-06-27 DIAGNOSIS — N186 End stage renal disease: Secondary | ICD-10-CM | POA: Insufficient documentation

## 2013-06-27 DIAGNOSIS — G4733 Obstructive sleep apnea (adult) (pediatric): Secondary | ICD-10-CM | POA: Insufficient documentation

## 2013-06-27 DIAGNOSIS — Z8719 Personal history of other diseases of the digestive system: Secondary | ICD-10-CM | POA: Insufficient documentation

## 2013-06-27 DIAGNOSIS — I120 Hypertensive chronic kidney disease with stage 5 chronic kidney disease or end stage renal disease: Secondary | ICD-10-CM | POA: Insufficient documentation

## 2013-06-27 DIAGNOSIS — L259 Unspecified contact dermatitis, unspecified cause: Secondary | ICD-10-CM | POA: Insufficient documentation

## 2013-06-27 HISTORY — DX: Dependence on renal dialysis: Z99.2

## 2013-06-27 MED ORDER — TRIAMCINOLONE ACETONIDE 0.1 % EX CREA: 1.0000 "application " | TOPICAL_CREAM | Freq: Two times a day (BID) | CUTANEOUS | Status: DC

## 2013-06-27 MED ORDER — DIPHENHYDRAMINE HCL 25 MG PO TABS: 25.0000 mg | ORAL_TABLET | Freq: Four times a day (QID) | ORAL | Status: DC

## 2013-06-27 MED ORDER — DIPHENHYDRAMINE HCL 25 MG PO CAPS
50.0000 mg | ORAL_CAPSULE | Freq: Once | ORAL | Status: AC
  Administered 2013-06-28: 50 mg via ORAL
  Filled 2013-06-27: qty 2

## 2013-06-27 NOTE — ED Notes (Signed)
Pt c/o both hands itching x 2 days.

## 2013-06-28 NOTE — ED Provider Notes (Signed)
Medical screening examination/treatment/procedure(s) were performed by non-physician practitioner and as supervising physician I was immediately available for consultation/collaboration.  EKG Interpretation   None       Devoria Albe, MD, Armando Gang   Ward Givens, MD 06/28/13 Jacinta Shoe

## 2013-06-28 NOTE — ED Provider Notes (Signed)
CSN: [id number]     Arrival date & time 06/27/13  2111 History   First MD Initiated Contact with Patient 06/27/13 2255     Chief Complaint  Patient presents with  . Pruritis   (Consider location/radiation/quality/duration/timing/severity/associated sxs/prior Treatment) HPI Comments: Matthew Kane is a 62 y.o. Male presenting with bilateral hand itching which started this afternoon,  Shortly after he came in contact with a "blue toilet bowel cleaner".  He denies rash but the palms of his hands have been very red and itchy.  He washed his hands and has been using rubbing alcohol which temporarily relieves the symptom.  He was unable to sleep due to the severity of symptoms so presented here. He denies any new medicines or other chemical contacts.     The history is provided by the patient.    Past Medical History  Diagnosis Date  . Hypertension     Echo in 10/2006-moderate LVH, normal EF  . Obstructive sleep apnea 2008    2008- uses CPAP  . Obesity   . Gout   . Gastroesophageal reflux disease   . Erectile dysfunction   . Prostatic hypertrophy, benign   . Allergic rhinitis   . Difficult intubation     Spasms after LMA inserted -12/13/10  . Chronic kidney disease, stage 5, kidney failure      dialysis at [facility] T/TH/Sat; ESRD as of 05/2011; leg cramps; hypokalemia; microcytosis  . Secondary hyperparathyroidism of renal origin   . Anemia   . Dialysis patient    Past Surgical History  Procedure Laterality Date  . Total hip arthroplasty      bilateral  . Portacath placement    . Cholecystectomy    . Finger amputation      right middle  . Colonoscopy  07/12/2011    Procedure: COLONOSCOPY;  Surgeon: Arlyce Harman, MD;  [facility];  Service: Endoscopy;  Laterality: N/A;  1:00  . Av fistula placement  05-31-11    Right radiocephalic AVF  . 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 Joint replacement      times 3  .  Av fistula placement  04/01/2012    Procedure: INSERTION OF ARTERIOVENOUS (AV) GORE-TEX GRAFT ARM;  Surgeon: Sherren Kerns, MD;  Location: [place] OR;  Service: Vascular;  Laterality: Left;   Family History  Problem Relation Age of Onset  . Lung cancer Father 60    hepatic metastases  . Heart attack Mother 80  . Cancer Brother     unk   History  Substance Use Topics  . Smoking status: Former Smoker -- 1.00 packs/day for 10 years    Types: Cigarettes    Quit date: 01/11/1975  . Smokeless tobacco: Never Used  . Alcohol Use: No    Review of Systems  Constitutional: Negative for fever.  HENT: Negative for congestion and sore throat.   Eyes: Negative.   Respiratory: Negative for chest tightness and shortness of breath.   Cardiovascular: Negative for chest pain.  Gastrointestinal: Negative for nausea and abdominal pain.  Genitourinary: Negative.   Musculoskeletal: Negative for arthralgias, joint swelling and neck pain.  Skin: Positive for color change. Negative for rash and wound.  Neurological: Negative for dizziness, weakness, light-headedness, numbness and headaches.  Psychiatric/Behavioral: Negative.     Allergies  Penicillins  Home Medications   Current Outpatient Rx  Name  Route  Sig  Dispense  Refill  .  amLODipine (NORVASC) 10 MG tablet   Oral   Take 10 mg by mouth every morning.          Marland Kitchen aspirin 81 MG chewable tablet   Oral   Chew 81 mg by mouth every morning.          Marland Kitchen b complex-vitamin c-folic acid (NEPHRO-VITE) 0.8 MG TABS   Oral   Take 0.8 mg by mouth every morning.          . calcium acetate (PHOSLO) 667 MG capsule   Oral   Take 667-2,001 mg by mouth 3 (three) times daily with meals. 3 caps with breakfast, 1 cap with lunch, 1 cap with dinner         . cloNIDine (CATAPRES) 0.3 MG tablet   Oral   Take 0.3 mg by mouth 2 (two) times daily.         Marland Kitchen HYDROcodone-acetaminophen (NORCO) 5-325 MG per tablet   Oral   Take 2 tablets by mouth every  4 (four) hours as needed for pain.   20 tablet   0   . metoprolol (LOPRESSOR) 50 MG tablet   Oral   Take 50 mg by mouth 2 (two) times daily.         . SENSIPAR 30 MG tablet   Oral   Take 30 mg by mouth every morning.          Marland Kitchen allopurinol (ZYLOPRIM) 100 MG tablet   Oral   Take 100 mg by mouth every evening.          . diphenhydrAMINE (BENADRYL) 25 MG tablet   Oral   Take 1 tablet (25 mg total) by mouth every 6 (six) hours.   20 tablet   0   . naproxen sodium (ANAPROX) 220 MG tablet   Oral   Take 220 mg by mouth daily as needed. For back pain         . triamcinolone cream (KENALOG) 0.1 %   Topical   Apply 1 application topically 2 (two) times daily. Apply 2 times daily until symptoms resolve,  Maximum 7 days.   30 g   0    BP 171/98  Pulse 74  Temp(Src) 97.6 F (36.4 C)  Resp 18  Ht 6' (1.829 m)  Wt 210 lb (95.255 kg)  BMI 28.47 kg/m2  SpO2 100% Physical Exam  Constitutional: He appears well-developed and well-nourished. No distress.  HENT:  Head: Normocephalic.  Neck: Neck supple.  Cardiovascular: Normal rate.   Pulmonary/Chest: Effort normal. He has no wheezes.  Musculoskeletal: Normal range of motion. He exhibits no edema.  Skin: No rash noted. There is erythema.  Palms of hands only are hyperemic without rash.  He is constantly rubbing the palms during exam.    ED Course  Procedures (including critical care time) Labs Review Labs Reviewed - No data to display Imaging Review No results found.  EKG Interpretation   None       MDM   1. Contact dermatitis    Pt encouraged cool compresses or ice packs.  Benadryl.  Triamcinolone cream 0.1%.  Prn f/u with pcp.  He has dialysis in am.  Discussed elevated bp.  He has not taken his evening metoprolol or clonidine yet.  Advised to take once home.    Burgess Amor, PA-C 06/28/13 [phone]

## 2014-02-01 ENCOUNTER — Emergency Department (HOSPITAL_COMMUNITY)
Admission: EM | Admit: 2014-02-01 | Discharge: 2014-02-01 | Disposition: A | Discharge status: Home / Self Care | Payer: Medicare HMO | Attending: Emergency Medicine | Admitting: Emergency Medicine

## 2014-02-01 ENCOUNTER — Encounter (HOSPITAL_COMMUNITY): Payer: Self-pay | Admitting: Emergency Medicine

## 2014-02-01 DIAGNOSIS — Z9981 Dependence on supplemental oxygen: Secondary | ICD-10-CM | POA: Insufficient documentation

## 2014-02-01 DIAGNOSIS — Z992 Dependence on renal dialysis: Secondary | ICD-10-CM | POA: Insufficient documentation

## 2014-02-01 DIAGNOSIS — IMO0002 Reserved for concepts with insufficient information to code with codable children: Secondary | ICD-10-CM | POA: Insufficient documentation

## 2014-02-01 DIAGNOSIS — E669 Obesity, unspecified: Secondary | ICD-10-CM | POA: Insufficient documentation

## 2014-02-01 DIAGNOSIS — M109 Gout, unspecified: Secondary | ICD-10-CM | POA: Insufficient documentation

## 2014-02-01 DIAGNOSIS — Z9089 Acquired absence of other organs: Secondary | ICD-10-CM | POA: Insufficient documentation

## 2014-02-01 DIAGNOSIS — Z87891 Personal history of nicotine dependence: Secondary | ICD-10-CM | POA: Insufficient documentation

## 2014-02-01 DIAGNOSIS — N186 End stage renal disease: Secondary | ICD-10-CM | POA: Insufficient documentation

## 2014-02-01 DIAGNOSIS — G4733 Obstructive sleep apnea (adult) (pediatric): Secondary | ICD-10-CM | POA: Insufficient documentation

## 2014-02-01 DIAGNOSIS — I120 Hypertensive chronic kidney disease with stage 5 chronic kidney disease or end stage renal disease: Secondary | ICD-10-CM | POA: Insufficient documentation

## 2014-02-01 DIAGNOSIS — N451 Epididymitis: Secondary | ICD-10-CM

## 2014-02-01 DIAGNOSIS — Z862 Personal history of diseases of the blood and blood-forming organs and certain disorders involving the immune mechanism: Secondary | ICD-10-CM | POA: Insufficient documentation

## 2014-02-01 DIAGNOSIS — N453 Epididymo-orchitis: Secondary | ICD-10-CM | POA: Insufficient documentation

## 2014-02-01 DIAGNOSIS — Z880 Allergy status to penicillin: Secondary | ICD-10-CM | POA: Insufficient documentation

## 2014-02-01 DIAGNOSIS — Z9889 Other specified postprocedural states: Secondary | ICD-10-CM | POA: Insufficient documentation

## 2014-02-01 DIAGNOSIS — Z87448 Personal history of other diseases of urinary system: Secondary | ICD-10-CM | POA: Insufficient documentation

## 2014-02-01 DIAGNOSIS — Z8719 Personal history of other diseases of the digestive system: Secondary | ICD-10-CM | POA: Insufficient documentation

## 2014-02-01 DIAGNOSIS — Z79899 Other long term (current) drug therapy: Secondary | ICD-10-CM | POA: Insufficient documentation

## 2014-02-01 LAB — URINALYSIS, ROUTINE W REFLEX MICROSCOPIC
BILIRUBIN URINE: NEGATIVE
Glucose, UA: 100 mg/dL — AB
Ketones, ur: NEGATIVE mg/dL
Nitrite: NEGATIVE
Protein, ur: 100 mg/dL — AB
SPECIFIC GRAVITY, URINE: 1.015 (ref 1.005–1.030)
UROBILINOGEN UA: 0.2 mg/dL (ref 0.0–1.0)
pH: 7.5 (ref 5.0–8.0)

## 2014-02-01 LAB — URINE MICROSCOPIC-ADD ON

## 2014-02-01 MED ORDER — HYDROCODONE-ACETAMINOPHEN 5-325 MG PO TABS
1.0000 | ORAL_TABLET | Freq: Once | ORAL | Status: AC
  Administered 2014-02-01: 1 via ORAL
  Filled 2014-02-01: qty 1

## 2014-02-01 MED ORDER — HYDROCODONE-ACETAMINOPHEN 5-325 MG PO TABS: 1.0000 | ORAL_TABLET | ORAL | Status: DC | PRN

## 2014-02-01 MED ORDER — CIPROFLOXACIN HCL 500 MG PO TABS: 500.0000 mg | ORAL_TABLET | Freq: Two times a day (BID) | ORAL | Status: DC

## 2014-02-01 MED ORDER — CIPROFLOXACIN HCL 250 MG PO TABS
500.0000 mg | ORAL_TABLET | Freq: Once | ORAL | Status: AC
  Administered 2014-02-01: 500 mg via ORAL
  Filled 2014-02-01: qty 2

## 2014-02-01 NOTE — ED Notes (Signed)
Patient with no complaints at this time. Respirations even and unlabored. Skin warm/dry. Discharge instructions reviewed with patient at this time. Patient given opportunity to voice concerns/ask questions. Patient discharged at this time and left Emergency Department with steady gait.   

## 2014-02-01 NOTE — ED Notes (Signed)
PT c/o right sided groin pain worsening with BM and urination. PT on dialysis Tu/Th/Sat.

## 2014-02-01 NOTE — Discharge Instructions (Signed)
Epididymitis °Epididymitis is a swelling (inflammation) of the epididymis. The epididymis is a cord-like structure along the back part of the testicle. Epididymitis is usually, but not always, caused by infection. This is usually a sudden problem beginning with chills, fever and pain behind the scrotum and in the testicle. There may be swelling and redness of the testicle. °DIAGNOSIS  °Physical examination will reveal a tender, swollen epididymis. Sometimes, cultures are obtained from the urine or from prostate secretions to help find out if there is an infection or if the cause is a different problem. Sometimes, blood work is performed to see if your white blood cell count is elevated and if a germ (bacterial) or viral infection is present. Using this knowledge, an appropriate medicine which kills germs (antibiotic) can be chosen by your caregiver. A viral infection causing epididymitis will most often go away (resolve) without treatment. °HOME CARE INSTRUCTIONS  °· Hot sitz baths for 20 minutes, 4 times per day, may help relieve pain. °· Only take over-the-counter or prescription medicines for pain, discomfort or fever as directed by your caregiver. °· Take all medicines, including antibiotics, as directed. Take the antibiotics for the full prescribed length of time even if you are feeling better. °· It is very important to keep all follow-up appointments. °SEEK IMMEDIATE MEDICAL CARE IF:  °· You have a fever. °· You have pain not relieved with medicines. °· You have any worsening of your problems. °· Your pain seems to come and go. °· You develop pain, redness, and swelling in the scrotum and surrounding areas. °MAKE SURE YOU:  °· Understand these instructions. °· Will watch your condition. °· Will get help right away if you are not doing well or get worse. °Document Released: 07/21/2000 Document Revised: 10/16/2011 Document Reviewed: 06/10/2009 °ExitCare® Patient Information ©2015 [employer]. This information  is not intended to replace advice given to you by your health care provider. Make sure you discuss any questions you have with your health care provider. ° °

## 2014-02-01 NOTE — ED Provider Notes (Signed)
CSN: [id number]     Arrival date & time 02/01/14  1026 History  This chart was scribed for Tanna Furry, MD by Elby Beck, ED Scribe. This patient was seen in room APA14/AP[facility]14 and the patient's care was started at 11:31 AM.   Chief Complaint  Patient presents with  . Groin Pain    The history is provided by the patient. No language interpreter was used.    HPI Comments: Matthew Kane is a 63 y.o. male who presents to the Emergency Department complaining of intermittent, moderate right testicle pain onset yesterday after straining during a bowel movement. He describes his pain as "soreness" and as "throbbing". He states that his pain is worsened with positional changes. He states that he is a Tuesday/Thursday/Saturday dialysis patient, and that he last had dialysis yesterday. He denies any nausea or emesis.    Past Medical History  Diagnosis Date  . Hypertension     Echo in 10/2006-moderate LVH, normal EF  . Obstructive sleep apnea 2008    2008- uses CPAP  . Obesity   . Gout   . Gastroesophageal reflux disease   . Erectile dysfunction   . Prostatic hypertrophy, benign   . Allergic rhinitis   . Difficult intubation     Spasms after LMA inserted -12/13/10  . Chronic kidney disease, stage 5, kidney failure      dialysis at [facility] T/TH/Sat; ESRD as of 05/2011; leg cramps; hypokalemia; microcytosis  . Secondary hyperparathyroidism of renal origin   . Anemia   . Dialysis patient    Past Surgical History  Procedure Laterality Date  . Total hip arthroplasty      bilateral  . Portacath placement    . Cholecystectomy    . Finger amputation      right middle  . Colonoscopy  07/12/2011    Procedure: COLONOSCOPY;  Surgeon: Dorothyann Peng, MD;  [facility];  Service: Endoscopy;  Laterality: N/A;  1:00  . Av fistula placement  05-31-11    Right radiocephalic AVF  . 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 Joint replacement       times 3  . Av fistula placement  04/01/2012    Procedure: INSERTION OF ARTERIOVENOUS (AV) GORE-TEX GRAFT ARM;  Surgeon: Elam Dutch, MD;  Location: [facility] OR;  Service: Vascular;  Laterality: Left;   Family History  Problem Relation Age of Onset  . Lung cancer Father 61    hepatic metastases  . Heart attack Mother 37  . Cancer Brother     unk   History  Substance Use Topics  . Smoking status: Former Smoker -- 1.00 packs/day for 10 years    Types: Cigarettes    Quit date: 01/11/1975  . Smokeless tobacco: Never Used  . Alcohol Use: No    Review of Systems  Constitutional: Negative for fever, chills, diaphoresis, appetite change and fatigue.  HENT: Negative for mouth sores, sore throat and trouble swallowing.   Eyes: Negative for visual disturbance.  Respiratory: Negative for cough, chest tightness, shortness of breath and wheezing.   Cardiovascular: Negative for chest pain.  Gastrointestinal: Negative for nausea, vomiting, abdominal pain, diarrhea and abdominal distention.  Endocrine: Negative for polydipsia, polyphagia and polyuria.  Genitourinary: Positive for testicular pain. Negative for dysuria, frequency and hematuria.  Musculoskeletal: Negative for gait problem.  Skin: Negative for color change, pallor and rash.  Neurological: Negative for dizziness, syncope, light-headedness and  headaches.  Hematological: Does not bruise/bleed easily.  Psychiatric/Behavioral: Negative for behavioral problems and confusion.    Allergies  Penicillins  Home Medications   Prior to Admission medications   Medication Sig Start Date End Date Taking? Authorizing Provider  allopurinol (ZYLOPRIM) 100 MG tablet Take 100 mg by mouth every evening.  06/27/13   Historical Provider, MD  amLODipine (NORVASC) 10 MG tablet Take 10 mg by mouth every morning.     Historical Provider, MD  b complex-vitamin c-folic acid (NEPHRO-VITE) 0.8 MG TABS Take 0.8 mg by mouth every morning.  11/13/11    Historical Provider, MD  calcium acetate (PHOSLO) 667 MG capsule Take 667-2,001 mg by mouth 3 (three) times daily with meals. 3 caps with breakfast, 1 cap with lunch, 1 cap with dinner 11/28/11   Historical Provider, MD  cloNIDine (CATAPRES) 0.3 MG tablet Take 0.3 mg by mouth 2 (two) times daily.    Historical Provider, MD  metoprolol (LOPRESSOR) 50 MG tablet Take 50 mg by mouth 2 (two) times daily. 04/09/13   Historical Provider, MD  triamcinolone cream (KENALOG) 0.1 % Apply 1 application topically 2 (two) times daily. Apply 2 times daily until symptoms resolve,  Maximum 7 days. 06/27/13   Evalee Jefferson, PA-C   Triage Vitals: BP 187/101  Pulse 68  Temp(Src) 97.4 F (36.3 C) (Oral)  Resp 18  Ht 6' (1.829 m)  Wt 204 lb (92.534 kg)  BMI 27.66 kg/m2  SpO2 95%  Physical Exam  Nursing note and vitals reviewed. Constitutional: He is oriented to person, place, and time. He appears well-developed and well-nourished. No distress.  HENT:  Head: Normocephalic.  Eyes: Conjunctivae are normal. Pupils are equal, round, and reactive to light. No scleral icterus.  Neck: Normal range of motion. Neck supple. No thyromegaly present.  Cardiovascular: Normal rate and regular rhythm.  Exam reveals no gallop and no friction rub.   No murmur heard. Pulmonary/Chest: Effort normal and breath sounds normal. No respiratory distress. He has no wheezes. He has no rales.  Abdominal: Soft. Bowel sounds are normal. He exhibits no distension. There is tenderness. There is no rebound.  Right suprapubic tenderness. No hernia sitting or standing.  Genitourinary:  Tenderness to right posterior testicle and the right epididymis. Cremasteric reflex is normal.  Musculoskeletal: Normal range of motion.  Neurological: He is alert and oriented to person, place, and time.  Skin: Skin is warm and dry. No rash noted.  Psychiatric: He has a normal mood and affect. His behavior is normal.    ED Course  Procedures (including critical  care time)  DIAGNOSTIC STUDIES: Oxygen Saturation is 95% on RA, adequate by my interpretation.    COORDINATION OF CARE: 11:35 AM- Pt advised of plan for treatment and pt agrees.  Labs Review Labs Reviewed  URINALYSIS, ROUTINE W REFLEX MICROSCOPIC - Abnormal; Notable for the following:    Glucose, UA 100 (*)    Hgb urine dipstick TRACE (*)    Protein, ur 100 (*)    Leukocytes, UA TRACE (*)    All other components within normal limits  URINE MICROSCOPIC-ADD ON - Abnormal; Notable for the following:    Bacteria, UA FEW (*)    All other components within normal limits  URINE CULTURE    Imaging Review No results found.   EKG Interpretation None      MDM   Final diagnoses:  Epididymitis    Patient has findings consistent with epididymitis. Is not enlarged and the testis. Tenderness on the  epididymis posteriorly. Intact cremasteric reflex. Plan will be symptomatic treatment and antibiotics.   I personally performed the services described in this documentation, which was scribed in my presence. The recorded information has been reviewed and is accurate.   Tanna Furry, MD 02/01/14 1336

## 2014-02-03 LAB — URINE CULTURE
COLONY COUNT: NO GROWTH
CULTURE: NO GROWTH

## 2014-04-16 ENCOUNTER — Telehealth: Payer: Self-pay | Admitting: Vascular Surgery

## 2014-04-16 NOTE — Telephone Encounter (Signed)
Message copied by Gena Fray on Thu Apr 16, 2014  3:19 PM ------      Message from: Gregery Na      Created: Thu Apr 16, 2014 12:59 PM       Will you schedule this patient for an office visit with the first doctor available to discuss (L) arm arteriovenous graft revision? This was called to me from [facility]. Thanks! ------

## 2014-04-16 NOTE — Telephone Encounter (Signed)
Rip Harbour already scheduled for 05/01/14 @ 2:00pm, dpm

## 2014-04-30 ENCOUNTER — Encounter: Payer: Self-pay | Admitting: Vascular Surgery

## 2014-05-01 ENCOUNTER — Ambulatory Visit (INDEPENDENT_AMBULATORY_CARE_PROVIDER_SITE_OTHER): Payer: Medicare HMO | Admitting: Vascular Surgery

## 2014-05-01 ENCOUNTER — Other Ambulatory Visit: Payer: Self-pay

## 2014-05-01 ENCOUNTER — Encounter: Payer: Self-pay | Admitting: Vascular Surgery

## 2014-05-01 ENCOUNTER — Encounter (HOSPITAL_COMMUNITY): Payer: Self-pay | Admitting: Pharmacy Technician

## 2014-05-01 VITALS — BP 192/106 | HR 71 | Ht 72.0 in | Wt 209.0 lb

## 2014-05-01 DIAGNOSIS — T82898A Other specified complication of vascular prosthetic devices, implants and grafts, initial encounter: Secondary | ICD-10-CM | POA: Insufficient documentation

## 2014-05-01 DIAGNOSIS — T82511A Breakdown (mechanical) of surgically created arteriovenous shunt, initial encounter: Secondary | ICD-10-CM | POA: Insufficient documentation

## 2014-05-01 DIAGNOSIS — N186 End stage renal disease: Secondary | ICD-10-CM

## 2014-05-01 NOTE — Progress Notes (Signed)
Referred by:  Iona Beard, MD [address] [place], [place] [address]  Reason for referral: revision of left forearm AVG  History of Present Illness  Matthew Kane is a 63 y.o. (08/18/1950) male who presents for evaluation for revision of L FA AVG.  This patient has undergone multiple access procedures in both arms.  His current access is a L FA AVG placed by Dr. Oneida Alar 04/01/12.  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  The patient denies any bleeding complications to date.  Past Medical History  Diagnosis Date  . Hypertension     Echo in 10/2006-moderate LVH, normal EF  . Obstructive sleep apnea 2008    2008- uses CPAP  . Obesity   . Gout   . Gastroesophageal reflux disease   . Erectile dysfunction   . Prostatic hypertrophy, benign   . Allergic rhinitis   . Difficult intubation     Spasms after LMA inserted -12/13/10  . Chronic kidney disease, stage 5, kidney failure      dialysis at [facility] T/TH/Sat; ESRD as of 05/2011; leg cramps; hypokalemia; microcytosis  . Secondary hyperparathyroidism of renal origin   . Anemia   . Dialysis patient     Past Surgical History  Procedure Laterality Date  . Total hip arthroplasty      bilateral  . Portacath placement    . Cholecystectomy    . Finger amputation      right middle  . Colonoscopy  07/12/2011    Procedure: COLONOSCOPY;  Surgeon: Dorothyann Peng, MD;  [facility];  Service: Endoscopy;  Laterality: N/A;  1:00  . Av fistula placement  05-31-11    Right radiocephalic AVF  . 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 Joint replacement      times 3  . Av fistula placement  04/01/2012    Procedure: INSERTION OF ARTERIOVENOUS (AV) GORE-TEX GRAFT ARM;  Surgeon: Elam Dutch, MD;  Location: [place];  Service: Vascular;  Laterality: Left;    History   Social History  . Marital Status: Divorced    Spouse Name: N/A     Number of Children: 3  . Years of Education: N/A   Occupational History  . Retired     [employer]   Social History Main Topics  . Smoking status: Former Smoker -- 1.00 packs/day for 10 years    Types: Cigarettes    Quit date: 01/11/1975  . Smokeless tobacco: Never Used  . Alcohol Use: No  . Drug Use: No  . Sexual Activity: Not on file   Other Topics Concern  . Not on file   Social History Narrative   Lives alone    Family History  Problem Relation Age of Onset  . Lung cancer Father 84    hepatic metastases  . Heart attack Mother 75  . Cancer Brother     unk    Current Outpatient Prescriptions on File Prior to Visit  Medication Sig Dispense Refill  . allopurinol (ZYLOPRIM) 100 MG tablet Take 100 mg by mouth every evening.       Marland Kitchen amLODipine (NORVASC) 10 MG tablet Take 10 mg by mouth every morning.       Marland Kitchen b complex-vitamin c-folic acid (NEPHRO-VITE) 0.8 MG TABS Take 0.8 mg by mouth every morning.       . calcium acetate (  PHOSLO) 667 MG capsule Take 667-2,001 mg by mouth 3 (three) times daily with meals. 3 caps with breakfast, 1 cap with lunch, 1 cap with dinner      . ciprofloxacin (CIPRO) 500 MG tablet Take 1 tablet (500 mg total) by mouth every 12 (twelve) hours.  20 tablet  0  . cloNIDine (CATAPRES) 0.3 MG tablet Take 0.3 mg by mouth 2 (two) times daily.      Marland Kitchen HYDROcodone-acetaminophen (NORCO/VICODIN) 5-325 MG per tablet Take 1 tablet by mouth every 4 (four) hours as needed.  10 tablet  0  . metoprolol (LOPRESSOR) 50 MG tablet Take 50 mg by mouth 2 (two) times daily.      Marland Kitchen triamcinolone cream (KENALOG) 0.1 % Apply 1 application topically daily.       No current facility-administered medications on file prior to visit.    Allergies  Allergen Reactions  . Penicillins Itching and Rash    REACTION: Rash and itching     REVIEW OF SYSTEMS:  (Positives checked otherwise negative)  CARDIOVASCULAR:  []  chest pain, []  chest pressure, []  palpitations, []   shortness of breath when laying flat, []  shortness of breath with exertion,  []  pain in feet when walking, []  pain in feet when laying flat, []  history of blood clot in veins (DVT), []  history of phlebitis, []  swelling in legs, []  varicose veins  PULMONARY:  []  productive cough, []  asthma, []  wheezing  NEUROLOGIC:  []  weakness in arms or legs, []  numbness in arms or legs, []  difficulty speaking or slurred speech, []  temporary loss of vision in one eye, []  dizziness  HEMATOLOGIC:  []  bleeding problems, []  problems with blood clotting too easily  MUSCULOSKEL:  []  joint pain, []  joint swelling  GASTROINTEST:  []  vomiting blood, []  blood in stool     GENITOURINARY:  []  burning with urination, []  blood in urine, [x]  ESRD-HD: T-R-S  PSYCHIATRIC:  []  history of major depression  INTEGUMENTARY:  []  rashes, []  ulcers  CONSTITUTIONAL:  []  fever, []  chills  Physical Examination  Filed Vitals:   05/01/14 1409  BP: 192/106  Pulse: 71  Height: 6' (1.829 m)  Weight: 209 lb (94.802 kg)  SpO2: 100%   Body mass index is 28.34 kg/(m^2).  General: A&O x 3, WDWN  Head: [place]/AT  Ear/Nose/Throat: Hearing grossly intact, nares w/o erythema or drainage, oropharynx w/o Erythema/Exudate, Mallampati score: 3  Eyes: PERRLA, EOMI  Neck: Supple, no nuchal rigidity, no palpable LAD  Pulmonary: Sym exp, good air movt, CTAB, no rales, rhonchi, & wheezing  Cardiac: RRR, Nl S1, S2, no Murmurs, rubs or gallops  Vascular: Vessel Right Left  Radial Faintly Palpable Faintly Palpable  Ulnar Not Palpable NotPalpable  Brachial Palpable Palpable  Carotid Palpable, without bruit Palpable, without bruit  Aorta Not palpable N/A  Femoral Palpable Palpable  Popliteal Not palpable Not palpable  PT Not Palpable Not Palpable  DP Not Palpable Not Palpable   Gastrointestinal: soft, NTND, -G/R, - HSM, - masses, - CVAT B  Musculoskeletal: M/S 5/5 throughout , Extremities without ischemic changes , L forearm loop  AVG with large pseudoaneurysm attenuated skin over arterial limb of graft, large pseudoaneurysm at arterial anastomosis also  Neurologic: CN 2-12 intact , Pain and light touch intact in extremities , Motor exam as listed above  Psychiatric: Judgment intact, Mood & affect appropriate for pt's clinical situation  Dermatologic: See M/S exam for extremity exam, no rashes otherwise noted  Lymph : No Cervical, Axillary, or Inguinal lymphadenopathy  Outside L arm fistulogram  Patent outflow, some serial stenoses proximal to antecubitum which are mostly resolved after venoplasty  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 male who presents with ESRD requiring hemodialysis, large PSA overlying arterial limb  Due to risk of bleeding from the large pseudoaneurysm, I would recommend revision of the arterial limb.  I had suggested possibly revising the anastomosis also, but the patient was not interested in such.  The patient is aware that the risks of access surgery include but are not limited to: bleeding, infection, steal syndrome, nerve damage, ischemic monomelic neuropathy, failure of access to mature, and possible need for additional access procedures in the future.  The patient has agreed to proceed with the above procedure which will be scheduled 30 SEP 15.  Adele Barthel, MD [facility] Office: [phone] Pager: [phone]  05/01/2014, 3:07 PM

## 2014-05-05 ENCOUNTER — Encounter (HOSPITAL_COMMUNITY): Payer: Self-pay | Admitting: *Deleted

## 2014-05-05 MED ORDER — VANCOMYCIN HCL IN DEXTROSE 1-5 GM/200ML-% IV SOLN
1000.0000 mg | INTRAVENOUS | Status: AC
  Administered 2014-05-06: 1000 mg via INTRAVENOUS
  Filled 2014-05-05: qty 200

## 2014-05-05 MED ORDER — SODIUM CHLORIDE 0.9 % IV SOLN: INTRAVENOUS | Status: DC

## 2014-05-06 ENCOUNTER — Encounter (HOSPITAL_COMMUNITY): Payer: Self-pay | Admitting: Anesthesiology

## 2014-05-06 ENCOUNTER — Telehealth: Payer: Self-pay | Admitting: Vascular Surgery

## 2014-05-06 ENCOUNTER — Ambulatory Visit (HOSPITAL_COMMUNITY): Payer: Medicare HMO

## 2014-05-06 ENCOUNTER — Ambulatory Visit (HOSPITAL_COMMUNITY)
Admission: RE | Admit: 2014-05-06 | Discharge: 2014-05-06 | Disposition: A | Discharge status: Home / Self Care | Payer: Medicare HMO | Source: Ambulatory Visit | Attending: Vascular Surgery | Admitting: Vascular Surgery

## 2014-05-06 ENCOUNTER — Encounter (HOSPITAL_COMMUNITY)
Admission: RE | Disposition: A | Discharge status: Home / Self Care | Payer: Self-pay | Source: Ambulatory Visit | Attending: Vascular Surgery

## 2014-05-06 ENCOUNTER — Ambulatory Visit (HOSPITAL_COMMUNITY): Payer: Medicare HMO | Admitting: Certified Registered Nurse Anesthetist

## 2014-05-06 ENCOUNTER — Encounter (HOSPITAL_COMMUNITY): Payer: Medicare HMO | Admitting: Certified Registered Nurse Anesthetist

## 2014-05-06 DIAGNOSIS — I120 Hypertensive chronic kidney disease with stage 5 chronic kidney disease or end stage renal disease: Secondary | ICD-10-CM | POA: Diagnosis not present

## 2014-05-06 DIAGNOSIS — N186 End stage renal disease: Secondary | ICD-10-CM | POA: Diagnosis not present

## 2014-05-06 DIAGNOSIS — D649 Anemia, unspecified: Secondary | ICD-10-CM | POA: Insufficient documentation

## 2014-05-06 DIAGNOSIS — E669 Obesity, unspecified: Secondary | ICD-10-CM | POA: Diagnosis not present

## 2014-05-06 DIAGNOSIS — G4733 Obstructive sleep apnea (adult) (pediatric): Secondary | ICD-10-CM | POA: Diagnosis not present

## 2014-05-06 DIAGNOSIS — Z992 Dependence on renal dialysis: Secondary | ICD-10-CM | POA: Insufficient documentation

## 2014-05-06 DIAGNOSIS — Z79899 Other long term (current) drug therapy: Secondary | ICD-10-CM | POA: Diagnosis not present

## 2014-05-06 DIAGNOSIS — Z87891 Personal history of nicotine dependence: Secondary | ICD-10-CM | POA: Insufficient documentation

## 2014-05-06 DIAGNOSIS — N529 Male erectile dysfunction, unspecified: Secondary | ICD-10-CM | POA: Diagnosis not present

## 2014-05-06 DIAGNOSIS — Y9210 Unspecified residential institution as the place of occurrence of the external cause: Secondary | ICD-10-CM | POA: Insufficient documentation

## 2014-05-06 DIAGNOSIS — T82898A Other specified complication of vascular prosthetic devices, implants and grafts, initial encounter: Secondary | ICD-10-CM | POA: Insufficient documentation

## 2014-05-06 DIAGNOSIS — N400 Enlarged prostate without lower urinary tract symptoms: Secondary | ICD-10-CM | POA: Insufficient documentation

## 2014-05-06 DIAGNOSIS — K219 Gastro-esophageal reflux disease without esophagitis: Secondary | ICD-10-CM | POA: Insufficient documentation

## 2014-05-06 DIAGNOSIS — Z880 Allergy status to penicillin: Secondary | ICD-10-CM | POA: Insufficient documentation

## 2014-05-06 DIAGNOSIS — M109 Gout, unspecified: Secondary | ICD-10-CM | POA: Diagnosis not present

## 2014-05-06 DIAGNOSIS — Y841 Kidney dialysis as the cause of abnormal reaction of the patient, or of later complication, without mention of misadventure at the time of the procedure: Secondary | ICD-10-CM | POA: Diagnosis not present

## 2014-05-06 DIAGNOSIS — T82511A Breakdown (mechanical) of surgically created arteriovenous shunt, initial encounter: Secondary | ICD-10-CM

## 2014-05-06 HISTORY — PX: REVISON OF ARTERIOVENOUS FISTULA: SHX6074

## 2014-05-06 HISTORY — DX: Personal history of other medical treatment: Z92.89

## 2014-05-06 LAB — POCT I-STAT 4, (NA,K, GLUC, HGB,HCT)
Glucose, Bld: 99 mg/dL (ref 70–99)
HCT: 35 % — ABNORMAL LOW (ref 39.0–52.0)
Hemoglobin: 11.9 g/dL — ABNORMAL LOW (ref 13.0–17.0)
Potassium: 4 mEq/L (ref 3.7–5.3)
SODIUM: 137 meq/L (ref 137–147)

## 2014-05-06 SURGERY — REVISON OF ARTERIOVENOUS FISTULA
Anesthesia: Monitor Anesthesia Care | Site: Arm Lower | Laterality: Left

## 2014-05-06 MED ORDER — SUCCINYLCHOLINE CHLORIDE 20 MG/ML IJ SOLN
INTRAMUSCULAR | Status: AC
  Filled 2014-05-06: qty 1

## 2014-05-06 MED ORDER — FENTANYL CITRATE 0.05 MG/ML IJ SOLN: 25.0000 ug | INTRAMUSCULAR | Status: DC | PRN

## 2014-05-06 MED ORDER — LIDOCAINE-EPINEPHRINE (PF) 1 %-1:200000 IJ SOLN
INTRAMUSCULAR | Status: DC | PRN
  Administered 2014-05-06: 30 mL

## 2014-05-06 MED ORDER — THROMBIN 20000 UNITS EX SOLR
CUTANEOUS | Status: DC | PRN
  Administered 2014-05-06: 09:00:00 via TOPICAL

## 2014-05-06 MED ORDER — LIDOCAINE-EPINEPHRINE (PF) 1 %-1:200000 IJ SOLN
INTRAMUSCULAR | Status: AC
  Filled 2014-05-06: qty 10

## 2014-05-06 MED ORDER — PROTAMINE SULFATE 10 MG/ML IV SOLN
INTRAVENOUS | Status: AC
  Filled 2014-05-06: qty 5

## 2014-05-06 MED ORDER — EPHEDRINE SULFATE 50 MG/ML IJ SOLN
INTRAMUSCULAR | Status: AC
  Filled 2014-05-06: qty 1

## 2014-05-06 MED ORDER — OXYCODONE-ACETAMINOPHEN 5-325 MG PO TABS: 1.0000 | ORAL_TABLET | Freq: Four times a day (QID) | ORAL | Status: DC | PRN

## 2014-05-06 MED ORDER — HEPARIN SODIUM (PORCINE) 1000 UNIT/ML IJ SOLN
INTRAMUSCULAR | Status: DC | PRN
  Administered 2014-05-06: 8000 [IU] via INTRAVENOUS

## 2014-05-06 MED ORDER — THROMBIN 20000 UNITS EX SOLR
CUTANEOUS | Status: AC
Start: 2014-05-06 — End: 2014-05-06
  Filled 2014-05-06: qty 20000

## 2014-05-06 MED ORDER — CHLORHEXIDINE GLUCONATE CLOTH 2 % EX PADS: 6.0000 | MEDICATED_PAD | Freq: Once | CUTANEOUS | Status: DC

## 2014-05-06 MED ORDER — BUPIVACAINE HCL (PF) 0.5 % IJ SOLN
INTRAMUSCULAR | Status: DC | PRN
  Administered 2014-05-06: 30 mL

## 2014-05-06 MED ORDER — PROPOFOL INFUSION 10 MG/ML OPTIME
INTRAVENOUS | Status: DC | PRN
Start: 2014-05-06 — End: 2014-05-06
  Administered 2014-05-06: 100 ug/kg/min via INTRAVENOUS

## 2014-05-06 MED ORDER — FENTANYL CITRATE 0.05 MG/ML IJ SOLN
INTRAMUSCULAR | Status: AC
  Filled 2014-05-06: qty 5

## 2014-05-06 MED ORDER — MIDAZOLAM HCL 5 MG/5ML IJ SOLN
INTRAMUSCULAR | Status: DC | PRN
  Administered 2014-05-06: 1 mg via INTRAVENOUS

## 2014-05-06 MED ORDER — PHENYLEPHRINE 40 MCG/ML (10ML) SYRINGE FOR IV PUSH (FOR BLOOD PRESSURE SUPPORT)
PREFILLED_SYRINGE | INTRAVENOUS | Status: AC
  Filled 2014-05-06: qty 10

## 2014-05-06 MED ORDER — PROPOFOL 10 MG/ML IV BOLUS
INTRAVENOUS | Status: AC
  Filled 2014-05-06: qty 20

## 2014-05-06 MED ORDER — 0.9 % SODIUM CHLORIDE (POUR BTL) OPTIME
TOPICAL | Status: DC | PRN
  Administered 2014-05-06: 1000 mL

## 2014-05-06 MED ORDER — SODIUM CHLORIDE 0.9 % IV SOLN
INTRAVENOUS | Status: DC | PRN
  Administered 2014-05-06: 08:00:00 via INTRAVENOUS

## 2014-05-06 MED ORDER — ONDANSETRON HCL 4 MG/2ML IJ SOLN: 4.0000 mg | Freq: Once | INTRAMUSCULAR | Status: DC | PRN

## 2014-05-06 MED ORDER — FENTANYL CITRATE 0.05 MG/ML IJ SOLN
INTRAMUSCULAR | Status: DC | PRN
  Administered 2014-05-06: 25 ug via INTRAVENOUS

## 2014-05-06 MED ORDER — PROTAMINE SULFATE 10 MG/ML IV SOLN
INTRAVENOUS | Status: DC | PRN
  Administered 2014-05-06 (×3): 10 mg via INTRAVENOUS

## 2014-05-06 MED ORDER — SODIUM CHLORIDE 0.9 % IR SOLN
Status: DC | PRN
  Administered 2014-05-06: 08:00:00

## 2014-05-06 MED ORDER — MIDAZOLAM HCL 2 MG/2ML IJ SOLN
INTRAMUSCULAR | Status: AC
  Filled 2014-05-06: qty 2

## 2014-05-06 SURGICAL SUPPLY — 40 items
ADH SKN CLS APL DERMABOND .7 (GAUZE/BANDAGES/DRESSINGS) ×1
BLADE SURG 10 STRL SS (BLADE) ×3 IMPLANT
CANISTER SUCTION 2500CC (MISCELLANEOUS) ×3 IMPLANT
CATH EMB 3FR 80CM (CATHETERS) ×2 IMPLANT
CLIP TI MEDIUM 6 (CLIP) ×3 IMPLANT
CLIP TI WIDE RED SMALL 6 (CLIP) ×3 IMPLANT
COVER SURGICAL LIGHT HANDLE (MISCELLANEOUS) ×3 IMPLANT
DECANTER SPIKE VIAL GLASS SM (MISCELLANEOUS) ×3 IMPLANT
DERMABOND ADVANCED (GAUZE/BANDAGES/DRESSINGS) ×2
DERMABOND ADVANCED .7 DNX12 (GAUZE/BANDAGES/DRESSINGS) ×1 IMPLANT
ELECT REM PT RETURN 9FT ADLT (ELECTROSURGICAL) ×3
ELECTRODE REM PT RTRN 9FT ADLT (ELECTROSURGICAL) ×1 IMPLANT
GLOVE BIO SURGEON STRL SZ7 (GLOVE) ×3 IMPLANT
GLOVE BIOGEL PI IND STRL 6.5 (GLOVE) IMPLANT
GLOVE BIOGEL PI IND STRL 7.0 (GLOVE) IMPLANT
GLOVE BIOGEL PI IND STRL 7.5 (GLOVE) ×1 IMPLANT
GLOVE BIOGEL PI INDICATOR 6.5 (GLOVE) ×2
GLOVE BIOGEL PI INDICATOR 7.0 (GLOVE) ×2
GLOVE BIOGEL PI INDICATOR 7.5 (GLOVE) ×6
GLOVE ECLIPSE 6.5 STRL STRAW (GLOVE) ×2 IMPLANT
GLOVE SS BIOGEL STRL SZ 7 (GLOVE) IMPLANT
GLOVE SUPERSENSE BIOGEL SZ 7 (GLOVE) ×2
GOWN STRL REUS W/ TWL LRG LVL3 (GOWN DISPOSABLE) ×3 IMPLANT
GOWN STRL REUS W/TWL LRG LVL3 (GOWN DISPOSABLE) ×9
GRAFT GORETEX STND 6X20 (Vascular Products) ×3 IMPLANT
GRAFT GORETEXSTD 6X20 (Vascular Products) IMPLANT
KIT BASIN OR (CUSTOM PROCEDURE TRAY) ×3 IMPLANT
KIT ROOM TURNOVER OR (KITS) ×3 IMPLANT
NS IRRIG 1000ML POUR BTL (IV SOLUTION) ×3 IMPLANT
PACK CV ACCESS (CUSTOM PROCEDURE TRAY) ×3 IMPLANT
PAD ARMBOARD 7.5X6 YLW CONV (MISCELLANEOUS) ×6 IMPLANT
SPONGE SURGIFOAM ABS GEL 100 (HEMOSTASIS) ×2 IMPLANT
SUT GORETEX 6.0 TT13 (SUTURE) ×6 IMPLANT
SUT MNCRL AB 4-0 PS2 18 (SUTURE) ×5 IMPLANT
SUT PROLENE 6 0 BV (SUTURE) ×2 IMPLANT
SUT VIC AB 3-0 SH 27 (SUTURE) ×6
SUT VIC AB 3-0 SH 27X BRD (SUTURE) ×1 IMPLANT
SYR TB 1ML LUER SLIP (SYRINGE) ×2 IMPLANT
UNDERPAD 30X30 INCONTINENT (UNDERPADS AND DIAPERS) ×3 IMPLANT
WATER STERILE IRR 1000ML POUR (IV SOLUTION) ×3 IMPLANT

## 2014-05-06 NOTE — H&P (View-Only) (Signed)
Referred by:  Iona Beard, MD [address] [place], [place] [address]  Reason for referral: revision of left forearm AVG  History of Present Illness  Matthew Kane is a 63 y.o. ([birth year]) male who presents for evaluation for revision of L FA AVG.  This patient has undergone multiple access procedures in both arms.  His current access is a L FA AVG placed by Dr. Oneida Alar 04/01/12.  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  The patient denies any bleeding complications to date.  Past Medical History  Diagnosis Date  . Hypertension     Echo in 10/2006-moderate LVH, normal EF  . Obstructive sleep apnea 2008    2008- uses CPAP  . Obesity   . Gout   . Gastroesophageal reflux disease   . Erectile dysfunction   . Prostatic hypertrophy, benign   . Allergic rhinitis   . Difficult intubation     Spasms after LMA inserted -12/13/10  . Chronic kidney disease, stage 5, kidney failure      dialysis at [facility] T/TH/Sat; ESRD as of 05/2011; leg cramps; hypokalemia; microcytosis  . Secondary hyperparathyroidism of renal origin   . Anemia   . Dialysis patient     Past Surgical History  Procedure Laterality Date  . Total hip arthroplasty      bilateral  . Portacath placement    . Cholecystectomy    . Finger amputation      right middle  . Colonoscopy  07/12/2011    Procedure: COLONOSCOPY;  Surgeon: Dorothyann Peng, MD;  Location: [facility];  Service: Endoscopy;  Laterality: N/A;  1:00  . Av fistula placement  05-31-11    Right radiocephalic AVF  . 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 Joint replacement      times 3  . Av fistula placement  04/01/2012    Procedure: INSERTION OF ARTERIOVENOUS (AV) GORE-TEX GRAFT ARM;  Surgeon: Elam Dutch, MD;  Location: [facility];  Service: Vascular;  Laterality: Left;    History   Social History  . Marital Status: Divorced    Spouse Name: N/A     Number of Children: 3  . Years of Education: N/A   Occupational History  . Retired     [employer]   Social History Main Topics  . Smoking status: Former Smoker -- 1.00 packs/day for 10 years    Types: Cigarettes    Quit date: 01/11/1975  . Smokeless tobacco: Never Used  . Alcohol Use: No  . Drug Use: No  . Sexual Activity: Not on file   Other Topics Concern  . Not on file   Social History Narrative   Lives alone    Family History  Problem Relation Age of Onset  . Lung cancer Father 4    hepatic metastases  . Heart attack Mother 76  . Cancer Brother     unk    Current Outpatient Prescriptions on File Prior to Visit  Medication Sig Dispense Refill  . allopurinol (ZYLOPRIM) 100 MG tablet Take 100 mg by mouth every evening.       Marland Kitchen amLODipine (NORVASC) 10 MG tablet Take 10 mg by mouth every morning.       Marland Kitchen b complex-vitamin c-folic acid (NEPHRO-VITE) 0.8 MG TABS Take 0.8 mg by mouth every morning.       . calcium acetate (  PHOSLO) 667 MG capsule Take 667-2,001 mg by mouth 3 (three) times daily with meals. 3 caps with breakfast, 1 cap with lunch, 1 cap with dinner      . ciprofloxacin (CIPRO) 500 MG tablet Take 1 tablet (500 mg total) by mouth every 12 (twelve) hours.  20 tablet  0  . cloNIDine (CATAPRES) 0.3 MG tablet Take 0.3 mg by mouth 2 (two) times daily.      Marland Kitchen HYDROcodone-acetaminophen (NORCO/VICODIN) 5-325 MG per tablet Take 1 tablet by mouth every 4 (four) hours as needed.  10 tablet  0  . metoprolol (LOPRESSOR) 50 MG tablet Take 50 mg by mouth 2 (two) times daily.      Marland Kitchen triamcinolone cream (KENALOG) 0.1 % Apply 1 application topically daily.       No current facility-administered medications on file prior to visit.    Allergies  Allergen Reactions  . Penicillins Itching and Rash    REACTION: Rash and itching     REVIEW OF SYSTEMS:  (Positives checked otherwise negative)  CARDIOVASCULAR:  []  chest pain, []  chest pressure, []  palpitations, []   shortness of breath when laying flat, []  shortness of breath with exertion,  []  pain in feet when walking, []  pain in feet when laying flat, []  history of blood clot in veins (DVT), []  history of phlebitis, []  swelling in legs, []  varicose veins  PULMONARY:  []  productive cough, []  asthma, []  wheezing  NEUROLOGIC:  []  weakness in arms or legs, []  numbness in arms or legs, []  difficulty speaking or slurred speech, []  temporary loss of vision in one eye, []  dizziness  HEMATOLOGIC:  []  bleeding problems, []  problems with blood clotting too easily  MUSCULOSKEL:  []  joint pain, []  joint swelling  GASTROINTEST:  []  vomiting blood, []  blood in stool     GENITOURINARY:  []  burning with urination, []  blood in urine, [x]  ESRD-HD: T-R-S  PSYCHIATRIC:  []  history of major depression  INTEGUMENTARY:  []  rashes, []  ulcers  CONSTITUTIONAL:  []  fever, []  chills  Physical Examination  Filed Vitals:   05/01/14 1409  BP: 192/106  Pulse: 71  Height: 6' (1.829 m)  Weight: 209 lb (94.802 kg)  SpO2: 100%   Body mass index is 28.34 kg/(m^2).  General: A&O x 3, WDWN  Head: [place]/AT  Ear/Nose/Throat: Hearing grossly intact, nares w/o erythema or drainage, oropharynx w/o Erythema/Exudate, Mallampati score: 3  Eyes: PERRLA, EOMI  Neck: Supple, no nuchal rigidity, no palpable LAD  Pulmonary: Sym exp, good air movt, CTAB, no rales, rhonchi, & wheezing  Cardiac: RRR, Nl S1, S2, no Murmurs, rubs or gallops  Vascular: Vessel Right Left  Radial Faintly Palpable Faintly Palpable  Ulnar Not Palpable NotPalpable  Brachial Palpable Palpable  Carotid Palpable, without bruit Palpable, without bruit  Aorta Not palpable N/A  Femoral Palpable Palpable  Popliteal Not palpable Not palpable  PT Not Palpable Not Palpable  DP Not Palpable Not Palpable   Gastrointestinal: soft, NTND, -G/R, - HSM, - masses, - CVAT B  Musculoskeletal: M/S 5/5 throughout , Extremities without ischemic changes , L forearm loop  AVG with large pseudoaneurysm attenuated skin over arterial limb of graft, large pseudoaneurysm at arterial anastomosis also  Neurologic: CN 2-12 intact , Pain and light touch intact in extremities , Motor exam as listed above  Psychiatric: Judgment intact, Mood & affect appropriate for pt's clinical situation  Dermatologic: See M/S exam for extremity exam, no rashes otherwise noted  Lymph : No Cervical, Axillary, or Inguinal lymphadenopathy  Outside L arm fistulogram  Patent outflow, some serial stenoses proximal to antecubitum which are mostly resolved after venoplasty  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 male who presents with ESRD requiring hemodialysis, large PSA overlying arterial limb  Due to risk of bleeding from the large pseudoaneurysm, I would recommend revision of the arterial limb.  I had suggested possibly revising the anastomosis also, but the patient was not interested in such.  The patient is aware that the risks of access surgery include but are not limited to: bleeding, infection, steal syndrome, nerve damage, ischemic monomelic neuropathy, failure of access to mature, and possible need for additional access procedures in the future.  The patient has agreed to proceed with the above procedure which will be scheduled 30 SEP 15.  Adele Barthel, MD [facility] Office: [phone] Pager: [phone]  05/01/2014, 3:07 PM

## 2014-05-06 NOTE — Interval H&P Note (Signed)
[facility]  History and Physical Update  The patient was interviewed and re-examined.  The patient's previous History and Physical has been reviewed and is unchanged.  There is no change in the plan of care: Left forearm AVG revision.  Adele Barthel, MD [facility] [place] Office: [phone] Pager: [phone]  05/06/2014, 7:58 AM

## 2014-05-06 NOTE — Anesthesia Postprocedure Evaluation (Signed)
  Anesthesia Post-op Note  Patient: Matthew Kane  Procedure(s) Performed: Procedure(s): REVISON OF ARTERIOVENOUS GRAFT (Left)  Patient Location: PACU  Anesthesia Type:MAC  Level of Consciousness: awake, alert  and oriented  Airway and Oxygen Therapy: Patient Spontanous Breathing  Post-op Pain: mild  Post-op Assessment: Post-op Vital signs reviewed, Patient's Cardiovascular Status Stable, Respiratory Function Stable, Patent Airway and Adequate PO intake  Post-op Vital Signs: stable  Last Vitals:  Filed Vitals:   05/06/14 1030  BP: 115/67  Pulse: 58  Temp: 36.4 C  Resp: 20    Complications: No apparent anesthesia complications

## 2014-05-06 NOTE — Anesthesia Preprocedure Evaluation (Addendum)
Anesthesia Evaluation  Patient identified by MRN, date of birth, ID band Patient awake    Reviewed: Allergy & Precautions, H&P , NPO status , Patient's Chart, lab work & pertinent test results, reviewed documented beta blocker date and time   History of Anesthesia Complications (+) DIFFICULT AIRWAY and history of anesthetic complications  Airway Mallampati: IV TM Distance: <3 FB Neck ROM: Full    Dental  (+) Teeth Intact   Pulmonary sleep apnea , former smoker,    Pulmonary exam normal       Cardiovascular hypertension, Pt. on medications and Pt. on home beta blockers     Neuro/Psych    GI/Hepatic GERD-  ,  Endo/Other    Renal/GU Renal disease     Musculoskeletal   Abdominal Normal abdominal exam  (+)   Peds  Hematology  (+) anemia ,   Anesthesia Other Findings   Reproductive/Obstetrics                          Anesthesia Physical Anesthesia Plan  ASA: III  Anesthesia Plan: MAC   Post-op Pain Management:    Induction: Intravenous  Airway Management Planned: Mask and LMA  Additional Equipment:   Intra-op Plan:   Post-operative Plan:   Informed Consent: I have reviewed the patients History and Physical, chart, labs and discussed the procedure including the risks, benefits and alternatives for the proposed anesthesia with the patient or authorized representative who has indicated his/her understanding and acceptance.   Dental advisory given  Plan Discussed with: CRNA, Anesthesiologist and Surgeon  Anesthesia Plan Comments:         Anesthesia Quick Evaluation

## 2014-05-06 NOTE — Telephone Encounter (Addendum)
Message copied by Gena Fray on Wed May 06, 2014  2:44 PM ------      Message from: Peter Minium K      Created: Wed May 06, 2014 10:53 AM      Regarding: Schedule                   ----- Message -----         From: Ulyses Amor, PA-C         Sent: 05/06/2014  10:12 AM           To: [facility] Charge Pool            Revision left arm av Graft forearm.  F/u with Dr. Bridgett Larsson in 2 weeks. ------  05/06/14: no answer, no vm, mailed letter, dpm

## 2014-05-06 NOTE — Transfer of Care (Signed)
Immediate Anesthesia Transfer of Care Note  Patient: Matthew Kane  Procedure(s) Performed: Procedure(s): REVISON OF ARTERIOVENOUS GRAFT (Left)  Patient Location: PACU  Anesthesia Type:MAC  Level of Consciousness: awake, alert  and oriented  Airway & Oxygen Therapy: Patient Spontanous Breathing and Patient connected to face mask oxygen  Post-op Assessment: Report given to PACU RN  Post vital signs: Reviewed and stable  Complications: No apparent anesthesia complications

## 2014-05-06 NOTE — Op Note (Signed)
OPERATIVE NOTE   PROCEDURE: 1. 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 Likely Gore seroma overlying original anastomoses 2. Palpable thrill at end of case  SPECIMEN(S):  none  INDICATIONS:   Matthew Kane is a 63 y.o. male who presents with large left forearm loop arteriovenous graft pseudoaneurysm overlying arterial limb of graft.  Based on exam, I felt he was at high risk of rupture so revision was indicated.  The patient is aware the risks include but are not limited to: bleeding, infection, thrombosis and possible need for additional procedures.  The patient agreed to proceed.  DESCRIPTION: After obtaining full informed written consent, the patient was brought back to the operating room and placed supine upon the operating table.  The patient received IV antibiotics prior to induction.  After obtaining adequate anesthesia, the patient was prepped and draped in the standard fashion for: left arm access procedure.  I made an incision over the proximal segment of the arterial limb of the graft and dissected out the graft over this segment.  I also made an incision near the apex the graft over the distal segment of the arterial limb of the graft.  I dissected out the graft in this incision.  There was extensive scarring around this segment of the graft.  The patient was given 8000 units of Heparin intravenously, which was a therapeutic bolus.  After waiting three minutes, I clamped the graft proximally in the proximal incision and distally in the distal incision.  I transected the graft in both incisions.  I dissected a subcutaneous tunnel adjacent to the prior graft with a curved Gore tunneler.  I delivered a 6  mm PTFE graft through the tunnel, maintaining orientation.  I removed the metal tunnel, leaving the graft in position.  I spatulated the old and new graft to facilitate an end-to-end anastomosis.  I sewed the new graft to the old graft with a running stitch of CV-6 in an end-to-end fashion.  I pulled the graft to tension in the tunnel.  I briefly released the graft to test the bleeding.  There was excellent pulsatile bleeding.  I reclamped the graft near the new anastomosis and washed out the graft with heparinized saline.  I then transected the distal new graft and the old graft in the distal incision.  I sewed the new and old graft together with a CV-6 in an end-to-end fashion.  Prior to completing this anastomosis, I backbled the venous end of this graft.  Initially, there was no back bleeding so I passed a 3 Fogarty balloon through the graft, extracting no thrombus.   This maneuver returns backbleeding.  I also allowed the graft to bleeding in an antegrade fashion.  There was excellent antegrade bleeding.  The anastomosis was completed in the usual fashion.  The patient was given 30 mg of Protamine to reverse anticoagulation.  I also packed thrombin and gelfoam in both incisions.  There continued to be some active bleeding in the proximal incision.  I found a pleat in the anastomosis which was repaired with a CV-6.  After waiting a few minutes, both incisions were washed out and thrombin and gelfoam repacked into both incisions.  Pressure was applied to both incisions for  a few minutes.  After a few minutes, no further bleeding was noted.  Each incision was repaired with a layer of 3-0 Vicryl and then a layer of 4-0 Monocryl in the skin.  The skin was cleaned, dried, and reinforced with Dermabond.  There was a strong thrill in the graft at the end of the graft.  COMPLICATIONS: none  CONDITION: stable  Adele Barthel, MD [facility] Office: [phone] Pager:  [phone]  05/06/2014, 9:56 AM

## 2014-05-07 ENCOUNTER — Encounter (HOSPITAL_COMMUNITY): Payer: Self-pay | Admitting: Vascular Surgery

## 2014-05-15 ENCOUNTER — Emergency Department (HOSPITAL_COMMUNITY)
Admission: EM | Admit: 2014-05-15 | Discharge: 2014-05-15 | Disposition: A | Discharge status: Home / Self Care | Payer: Medicare HMO | Attending: Emergency Medicine | Admitting: Emergency Medicine

## 2014-05-15 ENCOUNTER — Encounter (HOSPITAL_COMMUNITY): Payer: Self-pay | Admitting: Emergency Medicine

## 2014-05-15 ENCOUNTER — Emergency Department (HOSPITAL_COMMUNITY): Payer: Medicare HMO

## 2014-05-15 DIAGNOSIS — N185 Chronic kidney disease, stage 5: Secondary | ICD-10-CM | POA: Insufficient documentation

## 2014-05-15 DIAGNOSIS — I120 Hypertensive chronic kidney disease with stage 5 chronic kidney disease or end stage renal disease: Secondary | ICD-10-CM | POA: Diagnosis not present

## 2014-05-15 DIAGNOSIS — N508 Other specified disorders of male genital organs: Secondary | ICD-10-CM | POA: Insufficient documentation

## 2014-05-15 DIAGNOSIS — N433 Hydrocele, unspecified: Secondary | ICD-10-CM

## 2014-05-15 DIAGNOSIS — Z880 Allergy status to penicillin: Secondary | ICD-10-CM | POA: Diagnosis not present

## 2014-05-15 DIAGNOSIS — K219 Gastro-esophageal reflux disease without esophagitis: Secondary | ICD-10-CM | POA: Diagnosis not present

## 2014-05-15 DIAGNOSIS — Z992 Dependence on renal dialysis: Secondary | ICD-10-CM | POA: Diagnosis not present

## 2014-05-15 DIAGNOSIS — Z9981 Dependence on supplemental oxygen: Secondary | ICD-10-CM | POA: Insufficient documentation

## 2014-05-15 DIAGNOSIS — E669 Obesity, unspecified: Secondary | ICD-10-CM | POA: Insufficient documentation

## 2014-05-15 DIAGNOSIS — Z79899 Other long term (current) drug therapy: Secondary | ICD-10-CM | POA: Insufficient documentation

## 2014-05-15 DIAGNOSIS — N50811 Right testicular pain: Secondary | ICD-10-CM

## 2014-05-15 DIAGNOSIS — Z862 Personal history of diseases of the blood and blood-forming organs and certain disorders involving the immune mechanism: Secondary | ICD-10-CM | POA: Insufficient documentation

## 2014-05-15 DIAGNOSIS — Z87891 Personal history of nicotine dependence: Secondary | ICD-10-CM | POA: Diagnosis not present

## 2014-05-15 DIAGNOSIS — N50819 Testicular pain, unspecified: Secondary | ICD-10-CM

## 2014-05-15 DIAGNOSIS — Z7952 Long term (current) use of systemic steroids: Secondary | ICD-10-CM | POA: Diagnosis not present

## 2014-05-15 DIAGNOSIS — G4733 Obstructive sleep apnea (adult) (pediatric): Secondary | ICD-10-CM | POA: Diagnosis not present

## 2014-05-15 MED ORDER — OXYCODONE-ACETAMINOPHEN 5-325 MG PO TABS: 1.0000 | ORAL_TABLET | ORAL | Status: DC | PRN

## 2014-05-15 MED ORDER — OXYCODONE-ACETAMINOPHEN 5-325 MG PO TABS
2.0000 | ORAL_TABLET | Freq: Once | ORAL | Status: AC
  Administered 2014-05-15: 2 via ORAL
  Filled 2014-05-15: qty 2

## 2014-05-15 NOTE — ED Notes (Signed)
Pt c/o right testicle pain since last night.

## 2014-05-15 NOTE — Discharge Instructions (Signed)
Hydrocele, Adult Fluid can collect around the testicles. This fluid forms in a sac. This condition is called a hydrocele. The collected fluid causes swelling of the scrotum. Usually, it affects just one testicle. Most of the time, the condition does not cause pain. Sometimes, the hydrocele goes away on its own. Other times, surgery is needed to get rid of the fluid. CAUSES A hydrocele does not develop often. Different things can cause a hydrocele in a man, including:  Injury to the scrotum.  Infection.  X-ray of the area around the scrotum.  A tumor or cancer of the testicle.  Twisting of a testicle.  Decreased blood flow to the scrotum. SYMPTOMS   Swelling without pain. The hydrocele feels like a water-filled balloon.  Swelling with pain. This can occur if the hydrocele was caused by infection or twisting.  Mild discomfort in the scrotum.  The hydrocele may feel heavy.  Swelling that gets smaller when you lie down. DIAGNOSIS  Your caregiver will do a physical exam to decide if you have a hydrocele. This may include:  Asking questions about your overall health, today and in the past. Your caregiver may ask about any injuries, X-rays, or infections.  Pushing on your abdomen or asking you to change positions to see if the size of the hydrocele changes.  Shining a light through the scrotum (transillumination) to see if the fluid inside the scrotum is clear.  Blood tests and urine tests to check for infection.  Imaging studies that take pictures of the scrotum and testicles. TREATMENT  Treatment depends in part on what caused the condition. Options include:  Watchful waiting. Your caregiver checks the hydrocele every so often.  Different surgeries to drain the fluid.  A needle may be put into the scrotum to drain fluid (needle aspiration). Fluid often returns after this type of treatment.  A cut (incision) may be made in the scrotum to remove the fluid sac  (hydrocelectomy).  An incision may be made in the groin to repair a hydrocele that has contact with abdominal fluids (communicating hydrocele).  Medicines to treat an infection (antibiotics). HOME CARE INSTRUCTIONS  What you need to do at home may depend on the cause of the hydrocele and type of treatment. In general:  Take all medicine as directed by your caregiver. Follow the directions carefully.  Ask your caregiver if there is anything you should not do while you recover (activities, lifting, work, sex).  If you had surgery to repair a communicating hydrocele, recovery time may vary. Ask you caregiver about your recovery time.  Avoid heavy lifting for 4 to 6 weeks.  If you had an incision on the scrotum or groin, wash it for 2 to 3 days after surgery. Do this as long as the skin is closed and there are no gaps in the wound. Wash gently, and avoid rubbing the incision.  Keep all follow-up appointments. SEEK MEDICAL CARE IF:   Your scrotum seems to be getting larger.  The area becomes more and more uncomfortable. SEEK IMMEDIATE MEDICAL CARE IF:  You have a fever. Document Released: 01/11/2010 Document Revised: 05/14/2013 Document Reviewed: 01/11/2010 Exit[employer] Patient Information 2015 [employer]. This information is not intended to replace advice given to you by your health care provider. Make sure you discuss any questions you have with your health care provider.  

## 2014-05-15 NOTE — ED Notes (Signed)
US at bedside

## 2014-05-15 NOTE — ED Notes (Signed)
Patient reports needing to strain w/urination, post-micturation dribbling and spraying urine stream in addition to testicular pain.  R testicle is enlarged and boggy to palpation.  Exquisitely tender to light palpation. It is not reddened nor warm to touch, no cording posteriorly.

## 2014-05-15 NOTE — ED Notes (Signed)
Patient with no complaints at this time. Respirations even and unlabored. Skin warm/dry. Discharge instructions reviewed with patient at this time. Patient given opportunity to voice concerns/ask questions. Patient discharged at this time and left Emergency Department with steady gait.   

## 2014-05-15 NOTE — ED Provider Notes (Signed)
CSN: [id number]     Arrival date & time 05/15/14  8657 History  This chart was scribed for Sharyon Cable, MD by Ludger Nutting, ED Scribe. This patient was seen in room APA07/APA07 and the patient's care was started 9:35 AM.    Chief Complaint  Patient presents with  . Testicle Pain   Patient is a 63 y.o. male presenting with testicular pain. The history is provided by the patient. No language interpreter was used.  Testicle Pain This is a recurrent problem. The current episode started yesterday. The problem occurs constantly. Progression since onset: waxing and waning. Pertinent negatives include no chest pain, no abdominal pain, no headaches and no shortness of breath. The symptoms are aggravated by walking, standing and exertion. The symptoms are relieved by rest. He has tried rest (ibuprofen) for the symptoms. The treatment provided mild relief.   HPI Comments: Matthew Kane is a 63 y.o. male who presents to the Emergency Department complaining of gradual onset, waxing and waning, constant right sided testicular pain that began yesterday. He denies recent injury or trauma to the affected area. He states the pain is worsened with movement. He has taken ibuprofen with relief. He reports similar symptoms in the past for which he was evaluated in the ED. Patient states he receives dialysis and is able to produce some urine. He states his last treatment was yesterday.He denies fever, nausea, back pain, abdominal pain, dysuria, penile discharge.   Past Medical History  Diagnosis Date  . Hypertension     Echo in 10/2006-moderate LVH, normal EF  . Obstructive sleep apnea 2008    2008- uses CPAP  . Obesity   . Gout   . Gastroesophageal reflux disease   . Erectile dysfunction   . Prostatic hypertrophy, benign   . Allergic rhinitis   . Difficult intubation     Spasms after LMA inserted -12/13/10  . Chronic kidney disease, stage 5, kidney failure      dialysis at [facility] T/TH/Sat; ESRD as of  05/2011; leg cramps; hypokalemia; microcytosis  . Secondary hyperparathyroidism of renal origin   . Anemia   . Dialysis patient   . History of blood transfusion    Past Surgical History  Procedure Laterality Date  . Total hip arthroplasty      bilateral  . Portacath placement    . Cholecystectomy    . Finger amputation      right middle  . Colonoscopy  07/12/2011    Procedure: COLONOSCOPY;  Surgeon: Dorothyann Peng, MD;  [facility];  Service: Endoscopy;  Laterality: N/A;  1:00  . Av fistula placement  05-31-11    Right radiocephalic AVF  . 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 Joint replacement      times 3  . Av fistula placement  04/01/2012    Procedure: INSERTION OF ARTERIOVENOUS (AV) GORE-TEX GRAFT ARM;  Surgeon: Elam Dutch, MD;  Location: [facility] OR;  Service: Vascular;  Laterality: Left;  . Revison of arteriovenous fistula Left 05/06/2014    Procedure: REVISON OF ARTERIOVENOUS GRAFT;  Surgeon: Conrad Fluvanna, MD;  Location: [place];  Service: Vascular;  Laterality: Left;   Family History  Problem Relation Age of Onset  . Lung cancer Father 55    hepatic metastases  . Heart attack Mother 1  . Cancer Brother     unk   History  Substance Use Topics  .  Smoking status: Former Smoker -- 1.00 packs/day for 10 years    Types: Cigarettes    Quit date: 01/11/1975  . Smokeless tobacco: Never Used  . Alcohol Use: No    Review of Systems  Constitutional: Negative for fever.  Respiratory: Negative for shortness of breath.   Cardiovascular: Negative for chest pain.  Gastrointestinal: Negative for nausea, vomiting and abdominal pain.  Genitourinary: Positive for testicular pain. Negative for dysuria and penile pain.  Musculoskeletal: Negative for back pain.  Neurological: Negative for headaches.  All other systems reviewed and are negative.     Allergies  Penicillins  Home Medications   Prior to Admission  medications   Medication Sig Start Date End Date Taking? Authorizing Provider  allopurinol (ZYLOPRIM) 100 MG tablet Take 100 mg by mouth every evening.  06/27/13   Historical Provider, MD  amLODipine (NORVASC) 10 MG tablet Take 10 mg by mouth every morning.     Historical Provider, MD  b complex-vitamin c-folic acid (NEPHRO-VITE) 0.8 MG TABS Take 0.8 mg by mouth every morning.  11/13/11   Historical Provider, MD  calcium carbonate (TUMS - DOSED IN MG ELEMENTAL CALCIUM) 500 MG chewable tablet Chew 1-4 tablets by mouth 3 (three) times daily with meals. Take 4 tablets with largest meal of the day and 1-2 with smaller meals and snacks    Historical Provider, MD  cloNIDine (CATAPRES) 0.3 MG tablet Take 0.3 mg by mouth 2 (two) times daily.    Historical Provider, MD  metoprolol (LOPRESSOR) 50 MG tablet Take 50 mg by mouth 2 (two) times daily. 04/09/13   Historical Provider, MD  oxyCODONE-acetaminophen (PERCOCET/ROXICET) 5-325 MG per tablet Take 1 tablet by mouth every 6 (six) hours as needed. 05/06/14   Ulyses Amor, PA-C  triamcinolone cream (KENALOG) 0.1 % Apply 1 application topically daily as needed (for itching).     Historical Provider, MD   BP 216/120  Pulse 74  Temp(Src) 98.4 F (36.9 C)  Resp 18  Ht 6' (1.829 m)  Wt 209 lb (94.802 kg)  BMI 28.34 kg/m2  SpO2 98% Physical Exam  Nursing note and vitals reviewed. CONSTITUTIONAL: Well developed/well nourished HEAD: Normocephalic/atraumatic EYES: EOMI ENMT: Mucous membranes moist NECK: supple no meningeal signs CV: S1/S2 noted, no murmurs/rubs/gallops noted LUNGS: Lungs are clear to auscultation bilaterally, no apparent distress ABDOMEN: soft, nontender, no rebound or guarding GU:no cva tenderness, no hernia noted, right testicular tenderness noted. No overlying erythema or abscess or crepitus. Chaperone present.  NEURO: Pt is awake/alert, moves all extremitiesx4 EXTREMITIES: pulses normal, full ROM, dialysis access to left arm, thrill  noted  SKIN: warm, color normal PSYCH: no abnormalities of mood noted   ED Course  Procedures   DIAGNOSTIC STUDIES: Oxygen Saturation is 98% on RA, normal by my interpretation.    COORDINATION OF CARE: 9:41 AM Discussed treatment plan with pt at bedside and pt agreed to plan.    No signs of torsion/infectious etiology Pt resting comfortably Unable to obtain urine (pt only goes 1-2/day as he is on dialysis) He denied fever/dysuria/discharge Referred to urology Short course of pain meds given   Imaging Review US Scrotum  05/15/2014   CLINICAL DATA:  Testicular pain 1 day  EXAM: SCROTAL ULTRASOUND  DOPPLER ULTRASOUND OF THE TESTICLES  TECHNIQUE: Complete ultrasound examination of the testicles, epididymis, and other scrotal structures was performed. Color and spectral Doppler ultrasound were also utilized to evaluate blood flow to the testicles.  COMPARISON:  None.  FINDINGS: Pulsed Doppler interrogation of  both testes demonstrates low resistance arterial and venous waveforms bilaterally.  Right testicle  Measurements: 4.0 x 2.4 x 3.5 cm. No mass or microlithiasis visualized.  Left testicle  Measurements: 5.1 x 2.6 x 3.7 cm. No mass or microlithiasis visualized.  Right epididymis:  Normal in size and appearance.  Left epididymis: Normal size epididymis. 3 mm epididymal cyst. 6 mm epididymal cyst.  Hydrocele:  Large volume right hydrocele.  No hydrocele on the left.  Varicocele:  Right varicocele  IMPRESSION: Negative for testicular torsion or mass.  Right hydrocele  Left varicocele   Electronically Signed   By: Franchot Gallo M.D.   On: 05/15/2014 11:02   [place] Art/ven Flow Abd Pelv Doppler  05/15/2014   CLINICAL DATA:  Testicular pain 1 day  EXAM: SCROTAL ULTRASOUND  DOPPLER ULTRASOUND OF THE TESTICLES  TECHNIQUE: Complete ultrasound examination of the testicles, epididymis, and other scrotal structures was performed. Color and spectral Doppler ultrasound were also utilized to evaluate blood  flow to the testicles.  COMPARISON:  None.  FINDINGS: Pulsed Doppler interrogation of both testes demonstrates low resistance arterial and venous waveforms bilaterally.  Right testicle  Measurements: 4.0 x 2.4 x 3.5 cm. No mass or microlithiasis visualized.  Left testicle  Measurements: 5.1 x 2.6 x 3.7 cm. No mass or microlithiasis visualized.  Right epididymis:  Normal in size and appearance.  Left epididymis: Normal size epididymis. 3 mm epididymal cyst. 6 mm epididymal cyst.  Hydrocele:  Large volume right hydrocele.  No hydrocele on the left.  Varicocele:  Right varicocele  IMPRESSION: Negative for testicular torsion or mass.  Right hydrocele  Left varicocele   Electronically Signed   By: Franchot Gallo M.D.   On: 05/15/2014 11:02      MDM   Final diagnoses:  Pain in right testicle  Hydrocele, bilateral    Nursing notes including past medical history and social history reviewed and considered in documentation Previous records reviewed and considered   I personally performed the services described in this documentation, which was scribed in my presence. The recorded information has been reviewed and is accurate.      Sharyon Cable, MD 05/15/14 [phone]

## 2014-05-21 ENCOUNTER — Encounter: Payer: Self-pay | Admitting: Vascular Surgery

## 2014-05-22 ENCOUNTER — Encounter: Payer: Medicare HMO | Admitting: Vascular Surgery

## 2014-05-28 ENCOUNTER — Encounter: Payer: Self-pay | Admitting: Vascular Surgery

## 2014-05-29 ENCOUNTER — Ambulatory Visit (INDEPENDENT_AMBULATORY_CARE_PROVIDER_SITE_OTHER): Payer: Medicare HMO | Admitting: Vascular Surgery

## 2014-05-29 ENCOUNTER — Encounter: Payer: Self-pay | Admitting: Vascular Surgery

## 2014-05-29 VITALS — BP 170/90 | Temp 97.6°F | Resp 16 | Ht 72.0 in | Wt 204.3 lb

## 2014-05-29 DIAGNOSIS — Z4931 Encounter for adequacy testing for hemodialysis: Secondary | ICD-10-CM

## 2014-05-29 DIAGNOSIS — N186 End stage renal disease: Secondary | ICD-10-CM

## 2014-05-29 NOTE — Progress Notes (Signed)
    Postoperative Access Visit   History of Present Illness  DWAN HEMMELGARN is a 63 y.o. year old male who presents for postoperative follow-up for: revision of L FA AVF with interposition graft (Date: 05/06/14).  The patient's wounds are healed.  The patient notes no steal symptoms.  The patient is able to complete their activities of daily living.  The patient's current symptoms are: none.  For VQI Use Only  PRE-ADM LIVING: Home  AMB STATUS: Ambulatory  Physical Examination Filed Vitals:   05/29/14 1034  BP: 170/90  Temp: 97.6 F (36.4 C)  Resp: 16    LUE: Incisions are healed, skin feels warm, hand grip is 5/5, sensation in digits is intact, palpable thrill, bruit can be auscultated , prior PSA is thrombosed and firm  Medical Decision Making  BRAYLN DUQUE is a 63 y.o. year old male who presents s/p L FA AVG revision with interposition graft for imminent PSA rupture.  The patient's access is ready for use.  Thank you for allowing [place] to participate in this patient's care.  Adele Barthel, MD [facility] Office: [phone] Pager: [phone]  05/29/2014, 11:06 AM

## 2014-06-30 ENCOUNTER — Encounter (HOSPITAL_COMMUNITY): Payer: Self-pay | Admitting: Emergency Medicine

## 2014-06-30 ENCOUNTER — Emergency Department (HOSPITAL_COMMUNITY)
Admission: EM | Admit: 2014-06-30 | Discharge: 2014-07-01 | Disposition: A | Discharge status: Home / Self Care | Payer: Medicare HMO | Attending: Emergency Medicine | Admitting: Emergency Medicine

## 2014-06-30 DIAGNOSIS — K219 Gastro-esophageal reflux disease without esophagitis: Secondary | ICD-10-CM | POA: Insufficient documentation

## 2014-06-30 DIAGNOSIS — G8929 Other chronic pain: Secondary | ICD-10-CM | POA: Diagnosis not present

## 2014-06-30 DIAGNOSIS — N186 End stage renal disease: Secondary | ICD-10-CM | POA: Diagnosis not present

## 2014-06-30 DIAGNOSIS — Z880 Allergy status to penicillin: Secondary | ICD-10-CM | POA: Diagnosis not present

## 2014-06-30 DIAGNOSIS — Z87448 Personal history of other diseases of urinary system: Secondary | ICD-10-CM | POA: Insufficient documentation

## 2014-06-30 DIAGNOSIS — R1031 Right lower quadrant pain: Secondary | ICD-10-CM | POA: Insufficient documentation

## 2014-06-30 DIAGNOSIS — Z87891 Personal history of nicotine dependence: Secondary | ICD-10-CM | POA: Diagnosis not present

## 2014-06-30 DIAGNOSIS — Z79899 Other long term (current) drug therapy: Secondary | ICD-10-CM | POA: Diagnosis not present

## 2014-06-30 DIAGNOSIS — G4730 Sleep apnea, unspecified: Secondary | ICD-10-CM | POA: Diagnosis not present

## 2014-06-30 DIAGNOSIS — I120 Hypertensive chronic kidney disease with stage 5 chronic kidney disease or end stage renal disease: Secondary | ICD-10-CM | POA: Insufficient documentation

## 2014-06-30 DIAGNOSIS — Z992 Dependence on renal dialysis: Secondary | ICD-10-CM | POA: Insufficient documentation

## 2014-06-30 DIAGNOSIS — E669 Obesity, unspecified: Secondary | ICD-10-CM | POA: Insufficient documentation

## 2014-06-30 DIAGNOSIS — Z9981 Dependence on supplemental oxygen: Secondary | ICD-10-CM | POA: Diagnosis not present

## 2014-06-30 DIAGNOSIS — Z7952 Long term (current) use of systemic steroids: Secondary | ICD-10-CM | POA: Diagnosis not present

## 2014-06-30 DIAGNOSIS — Z862 Personal history of diseases of the blood and blood-forming organs and certain disorders involving the immune mechanism: Secondary | ICD-10-CM | POA: Insufficient documentation

## 2014-06-30 NOTE — ED Notes (Signed)
Patient ambulatory to bathroom to collect urine specimen.

## 2014-06-30 NOTE — ED Notes (Signed)
Pt c/o rt abd pain that he states has been going on for a while. Pt states he has been here several times for the same.

## 2014-07-01 LAB — URINE MICROSCOPIC-ADD ON

## 2014-07-01 LAB — URINALYSIS, ROUTINE W REFLEX MICROSCOPIC
Bilirubin Urine: NEGATIVE
Glucose, UA: 250 mg/dL — AB
Ketones, ur: NEGATIVE mg/dL
LEUKOCYTES UA: NEGATIVE
Nitrite: NEGATIVE
Protein, ur: 100 mg/dL — AB
Specific Gravity, Urine: 1.02 (ref 1.005–1.030)
Urobilinogen, UA: 0.2 mg/dL (ref 0.0–1.0)

## 2014-07-01 MED ORDER — HYDROCODONE-ACETAMINOPHEN 5-325 MG PO TABS: 1.0000 | ORAL_TABLET | ORAL | Status: DC | PRN

## 2014-07-01 NOTE — ED Notes (Signed)
Discharge instructions and prescription given and reviewed with patient.  Patient verbalized understanding of the importance of following up with urology.  Patient ambulatory with steady gait; discharged home in good condition.

## 2014-07-01 NOTE — Discharge Instructions (Signed)

## 2014-07-01 NOTE — ED Notes (Addendum)
Dr. Florina Ou in to assess.  Patient asleep when Dr. Florina Ou walked into room.  Testicle assessment completed with this RN as witness.

## 2014-07-01 NOTE — ED Provider Notes (Addendum)
CSN: [id number]     Arrival date & time 06/30/14  2255 History   First MD Initiated Contact with Patient 07/01/14 0057     Chief Complaint  Patient presents with  . Groin Pain     (Consider location/radiation/quality/duration/timing/severity/associated sxs/prior Treatment) HPI  This is a 63 year old male with a known history of right hydrocele and left varicocele with multiple ED visits for same. He is here complaining of right lower quadrant and right hemiscrotal pain for several months that has acutely worsened over the past several days. The pain is dull in nature and moderate to severe; it has not changed in quality. It is worse with movement or palpation. He said the pain was severe enough to make him nauseated yesterday but is not vomiting. He recently had a revision of his left forearm dialysis fistula.  Past Medical History  Diagnosis Date  . Hypertension     Echo in 10/2006-moderate LVH, normal EF  . Obstructive sleep apnea 2008    2008- uses CPAP  . Obesity   . Gout   . Gastroesophageal reflux disease   . Erectile dysfunction   . Prostatic hypertrophy, benign   . Allergic rhinitis   . Difficult intubation     Spasms after LMA inserted -12/13/10  . Chronic kidney disease, stage 5, kidney failure      dialysis at [facility] T/TH/Sat; ESRD as of 05/2011; leg cramps; hypokalemia; microcytosis  . Secondary hyperparathyroidism of renal origin   . Anemia   . Dialysis patient   . History of blood transfusion    Past Surgical History  Procedure Laterality Date  . Total hip arthroplasty      bilateral  . Portacath placement    . Cholecystectomy    . Finger amputation      right middle  . Colonoscopy  07/12/2011    Procedure: COLONOSCOPY;  Surgeon: Dorothyann Peng, MD;  [facility];  Service: Endoscopy;  Laterality: N/A;  1:00  . Av fistula placement  05-31-11    Right radiocephalic AVF  . Colostomy      + subsequent takedown secondary to colonic rupture related  to trauma  . Joint replacement      times 3  . Av fistula placement  04/01/2012    Procedure: INSERTION OF ARTERIOVENOUS (AV) GORE-TEX GRAFT ARM;  Surgeon: Elam Dutch, MD;  Location: [facility] OR;  Service: Vascular;  Laterality: Left;  . Revison of arteriovenous fistula Left 05/06/2014    Procedure: REVISON OF ARTERIOVENOUS GRAFT;  Surgeon: Conrad Atwater, MD;  Location: [place];  Service: Vascular;  Laterality: Left;   Family History  Problem Relation Age of Onset  . Lung cancer Father 64    hepatic metastases  . Heart attack Mother 32  . Cancer Brother     unk   History  Substance Use Topics  . Smoking status: Former Smoker -- 1.00 packs/day for 10 years    Types: Cigarettes    Quit date: 01/11/1975  . Smokeless tobacco: Never Used  . Alcohol Use: No    Review of Systems  All other systems reviewed and are negative.   Allergies  Penicillins  Home Medications   Prior to Admission medications   Medication Sig Start Date End Date Taking? Authorizing Provider  allopurinol (ZYLOPRIM) 100 MG tablet Take 100 mg by mouth every evening.  06/27/13   Historical Provider, MD  amLODipine (NORVASC) 10 MG tablet Take 10 mg by mouth every morning.  Historical Provider, MD  b complex-vitamin c-folic acid (NEPHRO-VITE) 0.8 MG TABS Take 0.8 mg by mouth every morning.  11/13/11   Historical Provider, MD  calcium carbonate (TUMS - DOSED IN MG ELEMENTAL CALCIUM) 500 MG chewable tablet Chew 1-4 tablets by mouth 3 (three) times daily with meals. Take 4 tablets with largest meal of the day and 1-2 with smaller meals and snacks    Historical Provider, MD  cloNIDine (CATAPRES) 0.3 MG tablet Take 0.3 mg by mouth 2 (two) times daily.    Historical Provider, MD  metoprolol (LOPRESSOR) 50 MG tablet Take 50 mg by mouth 2 (two) times daily. 04/09/13   Historical Provider, MD  oxyCODONE-acetaminophen (PERCOCET/ROXICET) 5-325 MG per tablet Take 1 tablet by mouth every 4 (four) hours as needed for severe pain.  05/15/14   Sharyon Cable, MD  triamcinolone cream (KENALOG) 0.1 % Apply 1 application topically daily as needed (for itching).     Historical Provider, MD   BP 186/98 mmHg  Temp(Src) 97.9 F (36.6 C) (Oral)  Resp 16  Ht 6' (1.829 m)  Wt 203 lb (92.08 kg)  BMI 27.53 kg/m2  SpO2 100%   Physical Exam  General: Well-developed, well-nourished male in no acute distress; appearance consistent with age of record HENT: normocephalic; atraumatic Eyes: pupils equal, round and reactive to light; extraocular muscles intact; arcus senilis bilaterally Neck: supple Heart: regular rate and rhythm Lungs: clear to auscultation bilaterally Abdomen: soft; nondistended; right lower quadrant tenderness; no masses or hepatosplenomegaly; bowel sounds present GU: Tanner 5 male, uncircumcised; edematous scrotum bilaterally with tenderness of the right epididymis and spermatic cord Extremities: No deformity; full range of motion; dialysis fistula left forearm with pulse and thrill medially and clotted segment laterally Neurologic: Sleeping but readily awakened; oriented; motor function intact in all extremities and symmetric; no facial droop Skin: Warm and dry Psychiatric: Normal mood and affect    ED Course  Procedures (including critical care time)   MDM   Nursing notes and vitals signs, including pulse oximetry, reviewed.  Summary of this visit's results, reviewed by myself:  Labs:  Results for orders placed or performed during the hospital encounter of 06/30/14 (from the past 24 hour(s))  Urinalysis with microscopic     Status: Abnormal   Collection Time: 06/30/14 11:43 PM  Result Value Ref Range   Color, Urine YELLOW YELLOW   APPearance CLEAR CLEAR   Specific Gravity, Urine 1.020 1.005 - 1.030   pH >9.0 (H) 5.0 - 8.0   Glucose, UA 250 (A) NEGATIVE mg/dL   Hgb urine dipstick SMALL (A) NEGATIVE   Bilirubin Urine NEGATIVE NEGATIVE   Ketones, ur NEGATIVE NEGATIVE mg/dL   Protein, ur 100  (A) NEGATIVE mg/dL   Urobilinogen, UA 0.2 0.0 - 1.0 mg/dL   Nitrite NEGATIVE NEGATIVE   Leukocytes, UA NEGATIVE NEGATIVE  Urine microscopic-add on     Status: Abnormal   Collection Time: 06/30/14 11:43 PM  Result Value Ref Range   Squamous Epithelial / LPF FEW (A) RARE   WBC, UA 0-2 <3 WBC/hpf   RBC / HPF 0-2 <3 RBC/hpf   Bacteria, UA FEW (A) RARE      Wynetta Fines, MD 07/01/14 0113  Wynetta Fines, MD 07/01/14 7001

## 2014-07-14 ENCOUNTER — Other Ambulatory Visit (HOSPITAL_COMMUNITY): Payer: Self-pay | Admitting: Nephrology

## 2014-07-14 DIAGNOSIS — R935 Abnormal findings on diagnostic imaging of other abdominal regions, including retroperitoneum: Secondary | ICD-10-CM

## 2014-07-16 ENCOUNTER — Encounter (HOSPITAL_COMMUNITY): Payer: Self-pay | Admitting: Vascular Surgery

## 2014-07-17 ENCOUNTER — Ambulatory Visit (HOSPITAL_COMMUNITY): Admission: RE | Admit: 2014-07-17 | Payer: Medicare HMO | Source: Ambulatory Visit

## 2014-07-20 ENCOUNTER — Ambulatory Visit (HOSPITAL_COMMUNITY)
Admission: RE | Admit: 2014-07-20 | Discharge: 2014-07-20 | Disposition: A | Discharge status: Home / Self Care | Payer: Medicare HMO | Source: Ambulatory Visit | Attending: Nephrology | Admitting: Nephrology

## 2014-07-20 ENCOUNTER — Encounter (HOSPITAL_COMMUNITY): Payer: Self-pay

## 2014-07-20 ENCOUNTER — Other Ambulatory Visit (HOSPITAL_COMMUNITY): Payer: Medicare HMO

## 2014-07-20 DIAGNOSIS — R1031 Right lower quadrant pain: Secondary | ICD-10-CM | POA: Insufficient documentation

## 2014-07-20 DIAGNOSIS — M47896 Other spondylosis, lumbar region: Secondary | ICD-10-CM | POA: Diagnosis not present

## 2014-07-20 DIAGNOSIS — K4090 Unilateral inguinal hernia, without obstruction or gangrene, not specified as recurrent: Secondary | ICD-10-CM | POA: Insufficient documentation

## 2014-07-20 DIAGNOSIS — N2889 Other specified disorders of kidney and ureter: Secondary | ICD-10-CM | POA: Diagnosis not present

## 2014-07-20 DIAGNOSIS — N261 Atrophy of kidney (terminal): Secondary | ICD-10-CM | POA: Diagnosis not present

## 2014-07-20 DIAGNOSIS — I700 Atherosclerosis of aorta: Secondary | ICD-10-CM | POA: Diagnosis not present

## 2014-07-20 DIAGNOSIS — Z87442 Personal history of urinary calculi: Secondary | ICD-10-CM | POA: Diagnosis not present

## 2014-07-20 DIAGNOSIS — R935 Abnormal findings on diagnostic imaging of other abdominal regions, including retroperitoneum: Secondary | ICD-10-CM

## 2014-07-20 MED ORDER — IOHEXOL 300 MG/ML  SOLN
100.0000 mL | Freq: Once | INTRAMUSCULAR | Status: AC | PRN
  Administered 2014-07-20: 100 mL via INTRAVENOUS

## 2014-12-04 ENCOUNTER — Encounter (HOSPITAL_COMMUNITY): Payer: Self-pay

## 2014-12-04 ENCOUNTER — Emergency Department (HOSPITAL_COMMUNITY): Payer: Medicare HMO

## 2014-12-04 ENCOUNTER — Observation Stay (HOSPITAL_COMMUNITY)
Admission: EM | Admit: 2014-12-04 | Discharge: 2014-12-05 | Disposition: A | Discharge status: Home / Self Care | Payer: Medicare HMO | Attending: Internal Medicine | Admitting: Internal Medicine

## 2014-12-04 DIAGNOSIS — N400 Enlarged prostate without lower urinary tract symptoms: Secondary | ICD-10-CM | POA: Diagnosis not present

## 2014-12-04 DIAGNOSIS — K219 Gastro-esophageal reflux disease without esophagitis: Secondary | ICD-10-CM | POA: Diagnosis present

## 2014-12-04 DIAGNOSIS — R7989 Other specified abnormal findings of blood chemistry: Secondary | ICD-10-CM | POA: Diagnosis present

## 2014-12-04 DIAGNOSIS — Z9981 Dependence on supplemental oxygen: Secondary | ICD-10-CM | POA: Insufficient documentation

## 2014-12-04 DIAGNOSIS — R112 Nausea with vomiting, unspecified: Secondary | ICD-10-CM | POA: Diagnosis not present

## 2014-12-04 DIAGNOSIS — R109 Unspecified abdominal pain: Secondary | ICD-10-CM | POA: Diagnosis present

## 2014-12-04 DIAGNOSIS — D649 Anemia, unspecified: Secondary | ICD-10-CM | POA: Insufficient documentation

## 2014-12-04 DIAGNOSIS — M109 Gout, unspecified: Secondary | ICD-10-CM | POA: Diagnosis not present

## 2014-12-04 DIAGNOSIS — I120 Hypertensive chronic kidney disease with stage 5 chronic kidney disease or end stage renal disease: Secondary | ICD-10-CM | POA: Insufficient documentation

## 2014-12-04 DIAGNOSIS — N185 Chronic kidney disease, stage 5: Secondary | ICD-10-CM | POA: Diagnosis present

## 2014-12-04 DIAGNOSIS — R778 Other specified abnormalities of plasma proteins: Secondary | ICD-10-CM | POA: Diagnosis present

## 2014-12-04 DIAGNOSIS — Z992 Dependence on renal dialysis: Secondary | ICD-10-CM | POA: Insufficient documentation

## 2014-12-04 DIAGNOSIS — N2581 Secondary hyperparathyroidism of renal origin: Secondary | ICD-10-CM | POA: Insufficient documentation

## 2014-12-04 DIAGNOSIS — R197 Diarrhea, unspecified: Secondary | ICD-10-CM | POA: Diagnosis not present

## 2014-12-04 DIAGNOSIS — D696 Thrombocytopenia, unspecified: Secondary | ICD-10-CM | POA: Diagnosis present

## 2014-12-04 DIAGNOSIS — E669 Obesity, unspecified: Secondary | ICD-10-CM | POA: Diagnosis not present

## 2014-12-04 DIAGNOSIS — N186 End stage renal disease: Secondary | ICD-10-CM

## 2014-12-04 DIAGNOSIS — J069 Acute upper respiratory infection, unspecified: Secondary | ICD-10-CM | POA: Diagnosis not present

## 2014-12-04 DIAGNOSIS — R0602 Shortness of breath: Principal | ICD-10-CM | POA: Diagnosis present

## 2014-12-04 DIAGNOSIS — Z87891 Personal history of nicotine dependence: Secondary | ICD-10-CM | POA: Diagnosis not present

## 2014-12-04 DIAGNOSIS — I10 Essential (primary) hypertension: Secondary | ICD-10-CM | POA: Diagnosis present

## 2014-12-04 DIAGNOSIS — Z79899 Other long term (current) drug therapy: Secondary | ICD-10-CM | POA: Insufficient documentation

## 2014-12-04 DIAGNOSIS — B349 Viral infection, unspecified: Secondary | ICD-10-CM | POA: Diagnosis present

## 2014-12-04 DIAGNOSIS — G4733 Obstructive sleep apnea (adult) (pediatric): Secondary | ICD-10-CM | POA: Diagnosis not present

## 2014-12-04 DIAGNOSIS — N529 Male erectile dysfunction, unspecified: Secondary | ICD-10-CM | POA: Insufficient documentation

## 2014-12-04 DIAGNOSIS — E875 Hyperkalemia: Secondary | ICD-10-CM | POA: Diagnosis not present

## 2014-12-04 DIAGNOSIS — Z880 Allergy status to penicillin: Secondary | ICD-10-CM | POA: Insufficient documentation

## 2014-12-04 HISTORY — DX: Other specified abnormalities of plasma proteins: R77.8

## 2014-12-04 HISTORY — DX: Thrombocytopenia, unspecified: D69.6

## 2014-12-04 HISTORY — DX: Other specified abnormal findings of blood chemistry: R79.89

## 2014-12-04 LAB — BRAIN NATRIURETIC PEPTIDE: B Natriuretic Peptide: 427 pg/mL — ABNORMAL HIGH (ref 0.0–100.0)

## 2014-12-04 LAB — COMPREHENSIVE METABOLIC PANEL
ALBUMIN: 3.6 g/dL (ref 3.5–5.2)
ALT: 11 U/L (ref 0–53)
AST: 12 U/L (ref 0–37)
Alkaline Phosphatase: 56 U/L (ref 39–117)
Anion gap: 12 (ref 5–15)
BUN: 39 mg/dL — ABNORMAL HIGH (ref 6–23)
CALCIUM: 8.9 mg/dL (ref 8.4–10.5)
CO2: 27 mmol/L (ref 19–32)
CREATININE: 10.84 mg/dL — AB (ref 0.50–1.35)
Chloride: 96 mmol/L (ref 96–112)
GFR calc Af Amer: 5 mL/min — ABNORMAL LOW (ref >90)
GFR calc non Af Amer: 4 mL/min — ABNORMAL LOW (ref >90)
GLUCOSE: 93 mg/dL (ref 70–99)
POTASSIUM: 4.5 mmol/L (ref 3.5–5.1)
Sodium: 135 mmol/L (ref 135–145)
TOTAL PROTEIN: 6.4 g/dL (ref 6.0–8.3)
Total Bilirubin: 0.7 mg/dL (ref 0.3–1.2)

## 2014-12-04 LAB — CBC WITH DIFFERENTIAL/PLATELET
Basophils Absolute: 0 10*3/uL (ref 0.0–0.1)
Basophils Relative: 0 % (ref 0–1)
Eosinophils Absolute: 0 10*3/uL (ref 0.0–0.7)
Eosinophils Relative: 1 % (ref 0–5)
HEMATOCRIT: 35.8 % — AB (ref 39.0–52.0)
Hemoglobin: 11.4 g/dL — ABNORMAL LOW (ref 13.0–17.0)
Lymphocytes Relative: 6 % — ABNORMAL LOW (ref 12–46)
Lymphs Abs: 0.5 10*3/uL — ABNORMAL LOW (ref 0.7–4.0)
MCH: 26.5 pg (ref 26.0–34.0)
MCHC: 31.8 g/dL (ref 30.0–36.0)
MCV: 83.1 fL (ref 78.0–100.0)
Monocytes Absolute: 1.1 10*3/uL — ABNORMAL HIGH (ref 0.1–1.0)
Monocytes Relative: 12 % (ref 3–12)
NEUTROS ABS: 7.1 10*3/uL (ref 1.7–7.7)
NEUTROS PCT: 81 % — AB (ref 43–77)
PLATELETS: 84 10*3/uL — AB (ref 150–400)
RBC: 4.31 MIL/uL (ref 4.22–5.81)
RDW: 16.5 % — AB (ref 11.5–15.5)
Smear Review: DECREASED
WBC: 8.8 10*3/uL (ref 4.0–10.5)

## 2014-12-04 LAB — TROPONIN I
TROPONIN I: 0.21 ng/mL — AB (ref <0.031)
Troponin I: 0.21 ng/mL — ABNORMAL HIGH (ref <0.031)
Troponin I: 0.2 ng/mL — ABNORMAL HIGH (ref <0.031)

## 2014-12-04 LAB — I-STAT CG4 LACTIC ACID, ED: Lactic Acid, Venous: 0.85 mmol/L (ref 0.5–2.0)

## 2014-12-04 LAB — MRSA PCR SCREENING: MRSA BY PCR: NEGATIVE

## 2014-12-04 LAB — INFLUENZA PANEL BY PCR (TYPE A & B)
H1N1 flu by pcr: NOT DETECTED
Influenza A By PCR: NEGATIVE
Influenza B By PCR: NEGATIVE

## 2014-12-04 LAB — TSH: TSH: 0.311 u[IU]/mL — ABNORMAL LOW (ref 0.350–4.500)

## 2014-12-04 MED ORDER — CALCIUM CARBONATE ANTACID 500 MG PO CHEW
4.0000 | CHEWABLE_TABLET | Freq: Two times a day (BID) | ORAL | Status: DC
  Administered 2014-12-04 – 2014-12-05 (×2): 800 mg via ORAL
  Filled 2014-12-04 (×4): qty 2

## 2014-12-04 MED ORDER — ONDANSETRON HCL 4 MG PO TABS: 4.0000 mg | ORAL_TABLET | Freq: Four times a day (QID) | ORAL | Status: DC | PRN

## 2014-12-04 MED ORDER — LORAZEPAM 2 MG/ML IJ SOLN
1.0000 mg | Freq: Once | INTRAMUSCULAR | Status: AC
  Administered 2014-12-04: 1 mg via INTRAVENOUS

## 2014-12-04 MED ORDER — IOHEXOL 350 MG/ML SOLN
100.0000 mL | Freq: Once | INTRAVENOUS | Status: AC | PRN
  Administered 2014-12-04: 100 mL via INTRAVENOUS

## 2014-12-04 MED ORDER — CALCITRIOL 0.25 MCG PO CAPS
0.5000 ug | ORAL_CAPSULE | Freq: Every day | ORAL | Status: DC
  Administered 2014-12-04: 0.5 ug via ORAL
  Filled 2014-12-04: qty 2

## 2014-12-04 MED ORDER — ACETAMINOPHEN 325 MG PO TABS: 650.0000 mg | ORAL_TABLET | Freq: Four times a day (QID) | ORAL | Status: DC | PRN

## 2014-12-04 MED ORDER — LORAZEPAM 2 MG/ML IJ SOLN
INTRAMUSCULAR | Status: AC
  Filled 2014-12-04: qty 1

## 2014-12-04 MED ORDER — ALLOPURINOL 100 MG PO TABS
100.0000 mg | ORAL_TABLET | Freq: Every evening | ORAL | Status: DC
  Administered 2014-12-04: 100 mg via ORAL
  Filled 2014-12-04: qty 1

## 2014-12-04 MED ORDER — ONDANSETRON HCL 4 MG/2ML IJ SOLN
4.0000 mg | Freq: Once | INTRAMUSCULAR | Status: AC
  Administered 2014-12-04: 4 mg via INTRAVENOUS
  Filled 2014-12-04: qty 2

## 2014-12-04 MED ORDER — ALUM & MAG HYDROXIDE-SIMETH 200-200-20 MG/5ML PO SUSP: 30.0000 mL | Freq: Four times a day (QID) | ORAL | Status: DC | PRN

## 2014-12-04 MED ORDER — SALINE SPRAY 0.65 % NA SOLN: 1.0000 | NASAL | Status: DC | PRN

## 2014-12-04 MED ORDER — HYDROCODONE-ACETAMINOPHEN 5-325 MG PO TABS: 1.0000 | ORAL_TABLET | ORAL | Status: DC | PRN

## 2014-12-04 MED ORDER — CLONIDINE HCL 0.2 MG PO TABS
0.3000 mg | ORAL_TABLET | Freq: Two times a day (BID) | ORAL | Status: DC
  Administered 2014-12-04: 0.3 mg via ORAL
  Filled 2014-12-04 (×2): qty 1

## 2014-12-04 MED ORDER — ACETAMINOPHEN 650 MG RE SUPP: 650.0000 mg | Freq: Four times a day (QID) | RECTAL | Status: DC | PRN

## 2014-12-04 MED ORDER — ONDANSETRON HCL 4 MG/2ML IJ SOLN: 4.0000 mg | Freq: Four times a day (QID) | INTRAMUSCULAR | Status: DC | PRN

## 2014-12-04 MED ORDER — METOPROLOL TARTRATE 50 MG PO TABS
50.0000 mg | ORAL_TABLET | Freq: Two times a day (BID) | ORAL | Status: DC
  Administered 2014-12-04: 50 mg via ORAL
  Filled 2014-12-04: qty 1

## 2014-12-04 MED ORDER — MORPHINE SULFATE 2 MG/ML IJ SOLN
1.0000 mg | INTRAMUSCULAR | Status: DC | PRN
Start: 2014-12-04 — End: 2014-12-05

## 2014-12-04 MED ORDER — AMLODIPINE BESYLATE 5 MG PO TABS: 10.0000 mg | ORAL_TABLET | Freq: Every morning | ORAL | Status: DC

## 2014-12-04 MED ORDER — POLYETHYLENE GLYCOL 3350 17 G PO PACK: 17.0000 g | PACK | Freq: Every day | ORAL | Status: DC | PRN

## 2014-12-04 MED ORDER — BENZONATATE 100 MG PO CAPS
100.0000 mg | ORAL_CAPSULE | Freq: Three times a day (TID) | ORAL | Status: DC
  Administered 2014-12-04: 100 mg via ORAL
  Filled 2014-12-04: qty 1

## 2014-12-04 MED ORDER — SODIUM CHLORIDE 0.9 % IJ SOLN
3.0000 mL | Freq: Two times a day (BID) | INTRAMUSCULAR | Status: DC
  Administered 2014-12-04: 3 mL via INTRAVENOUS

## 2014-12-04 NOTE — H&P (Signed)
[facility] History and Physical  WALT GEATHERS ZLD:[id number] DOB: [birth year] DOA: 12/04/2014  Referring physician: Betsey Holiday ED MD PCP: Maggie Font, MD   Chief Complaint: fatigue/nause/diarrhe  HPI: Matthew Kane is a very pleasant 64 y.o. male with a past medical history that includes end-stage renal disease on Tuesday Thursday Saturday dialysis schedule, hypertension, GERD, obstructive sleep apnea presents to the emergency department with the chief complaint of sudden onset but persistent fatigue and diarrhea. Initial evaluation reveals elevated troponin and EKG with nonspecific findings. Before meals reports he was in his usual state of health until yesterday afternoon when he left dialysis he felt like he "was taking a cold". Associated symptoms include nasal congestion, shortness of breath dry intermittent cough, subjective fever and chills. He denies chest pain palpitation headache visual disturbances syncope or near-syncope. He denies abdominal pain but does endorse some nausea without vomiting. When he got home from dialysis he felt too weak to walk to the house. He did walk to the house took his blood pressure medication and went to bed. He reports 2-3 episodes of loose stool. Denies melena or bright red blood per rectum. He did not eat dinner last evening or breakfast this morning. Workup in the emergency department includes complete blood count significant for hemoglobin of 11.4 and platelets of 84, basic metabolic panel significant for creatinine of 10.84 BUN of 39. Initial troponin is 0.21. Lactic acid 0.85, BNP 427. CT NGO the chest negative for PE. While in the emergency department is evaluated by cardiology who opines multiple systemic symptoms most consistent with viral syndrome. Per cardiology note could have a component of viral perimyocarditis or increased demand causing the troponin elevation. The emergency department he is afebrile slightly hypertensive  hemodynamically stable and not hypoxic.  Review of Systems:  10 point review of systems complete and all systems are negative except as indicated in the history of present illness   Past Medical History  Diagnosis Date  . Hypertension     Echo in 10/2006-moderate LVH, normal EF  . Obstructive sleep apnea 2008    2008- uses CPAP  . Obesity   . Gout   . Gastroesophageal reflux disease   . Erectile dysfunction   . Prostatic hypertrophy, benign   . Allergic rhinitis   . Difficult intubation     Spasms after LMA inserted -12/13/10  . Chronic kidney disease, stage 5, kidney failure      dialysis at [facility] T/TH/Sat; ESRD as of 05/2011; leg cramps; hypokalemia; microcytosis  . Secondary hyperparathyroidism of renal origin   . Anemia   . Dialysis patient   . History of blood transfusion   . Thrombocytopenia   . Elevated troponin    Past Surgical History  Procedure Laterality Date  . Total hip arthroplasty      bilateral  . Portacath placement    . Cholecystectomy    . Finger amputation      right middle  . Colonoscopy  07/12/2011    Procedure: COLONOSCOPY;  Surgeon: Dorothyann Peng, MD;  Location: [facility];  Service: Endoscopy;  Laterality: N/A;  1:00  . Av fistula placement  05-31-11    Right radiocephalic AVF  . 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 Joint replacement      times 3  . Av fistula placement  04/01/2012    Procedure: INSERTION OF ARTERIOVENOUS (AV) GORE-TEX GRAFT ARM;  Surgeon: Elam Dutch, MD;  Location: [facility] OR;  Service: Vascular;  Laterality: Left;  . Revison of arteriovenous fistula Left 05/06/2014    Procedure: REVISON OF ARTERIOVENOUS GRAFT;  Surgeon: Conrad Wilder, MD;  Location: [place];  Service: Vascular;  Laterality: Left;  . Shuntogram N/A 10/06/2011    Procedure: Earney Mallet;  Surgeon: Elam Dutch, MD;  Location: [facility] CATH LAB;  Service: Cardiovascular;  Laterality: N/A;   Social History:  reports  that he quit smoking about 39 years ago. His smoking use included Cigarettes. He has a 10 pack-year smoking history. He has never used smokeless tobacco. He reports that he does not drink alcohol or use illicit drugs. He is a retired Armed forces logistics/support/administrative officer. He lives at home with his girlfriend. He is independent with ADLs Allergies  Allergen Reactions  . Penicillins Itching and Rash    REACTION: Rash and itching    Family History  Problem Relation Age of Onset  . Lung cancer Father 19    hepatic metastases  . Heart attack Mother 81  . Cancer Brother     unk     Prior to Admission medications   Medication Sig Start Date End Date Taking? Authorizing Provider  allopurinol (ZYLOPRIM) 100 MG tablet Take 100 mg by mouth every evening.  06/27/13  Yes Historical Provider, MD  amLODipine (NORVASC) 10 MG tablet Take 10 mg by mouth every morning.    Yes Historical Provider, MD  b complex-vitamin c-folic acid (NEPHRO-VITE) 0.8 MG TABS Take 0.8 mg by mouth every morning.  11/13/11  Yes Historical Provider, MD  calcitRIOL (ROCALTROL) 0.5 MCG capsule Take 1 capsule by mouth daily. 11/04/10  Yes Historical Provider, MD  calcium carbonate (TUMS - DOSED IN MG ELEMENTAL CALCIUM) 500 MG chewable tablet Chew 1-4 tablets by mouth 3 (three) times daily with meals. Take 4 tablets with largest meal of the day and 1-2 with smaller meals and snacks   Yes Historical Provider, MD  cloNIDine (CATAPRES) 0.3 MG tablet Take 0.3 mg by mouth 2 (two) times daily.   Yes Historical Provider, MD  metoprolol (LOPRESSOR) 50 MG tablet Take 50 mg by mouth 2 (two) times daily. 04/09/13  Yes Historical Provider, MD  triamcinolone cream (KENALOG) 0.1 % Apply 1 application topically daily as needed (for itching).    Yes Historical Provider, MD  HYDROcodone-acetaminophen (NORCO) 5-325 MG per tablet Take 1-2 tablets by mouth every 4 (four) hours as needed (for pain). Patient not taking: Reported on 12/04/2014 07/01/14   Shanon Rosser, MD   Physical  Exam: Filed Vitals:   12/04/14 1430 12/04/14 1500 12/04/14 1527 12/04/14 1530  BP: 168/105 167/99  174/94  Pulse: 61 65  58  Temp:   97.5 F (36.4 C)   TempSrc:      Resp: 23 21  17   SpO2: 100% 100%  99%    Wt Readings from Last 3 Encounters:  06/30/14 92.08 kg (203 lb)  05/29/14 92.67 kg (204 lb 4.8 oz)  05/15/14 94.802 kg (209 lb)    General:  Appears calm and comfortable Eyes: PERRL, normal lids, irises, some scleral icterus ENT: grossly normal hearing, lips & tongue, mucous membranes of his mouth are moist and pink Neck: no LAD, masses or thyromegaly Cardiovascular: RRR, + soft murmur. No LE edema.  Respiratory: CTA bilaterally, no w/r/r. Normal respiratory effort. Rest sounds somewhat distant Abdomen: soft, ntnd Skin: no rash or induration seen on limited exam Musculoskeletal: grossly normal tone BUE/BLE Psychiatric: grossly normal mood and affect, speech fluent and appropriate  Neurologic: grossly non-focal. speech clear facial symmetry           Labs on Admission:  Basic Metabolic Panel:  Recent Labs Lab 12/04/14 0903  NA 135  K 4.5  CL 96  CO2 27  GLUCOSE 93  BUN 39*  CREATININE 10.84*  CALCIUM 8.9   Liver Function Tests:  Recent Labs Lab 12/04/14 0903  AST 12  ALT 11  ALKPHOS 56  BILITOT 0.7  PROT 6.4  ALBUMIN 3.6   No results for input(s): LIPASE, AMYLASE in the last 168 hours. No results for input(s): AMMONIA in the last 168 hours. CBC:  Recent Labs Lab 12/04/14 0903  WBC 8.8  NEUTROABS 7.1  HGB 11.4*  HCT 35.8*  MCV 83.1  PLT 84*   Cardiac Enzymes:  Recent Labs Lab 12/04/14 0903  TROPONINI 0.21*    BNP (last 3 results)  Recent Labs  12/04/14 0903  BNP 427.0*    ProBNP (last 3 results) No results for input(s): PROBNP in the last 8760 hours.  CBG: No results for input(s): GLUCAP in the last 168 hours.  Radiological Exams on Admission: Dg Chest 2 View  12/04/2014   CLINICAL DATA:  64 year old male with shortness  of breath since yesterday. Initial encounter. Current history of chronic kidney disease on dialysis.  EXAM: CHEST  2 VIEW  COMPARISON:  CT Abdomen and Pelvis 07/20/2014 chest radiographs 05/06/2014 and earlier.  FINDINGS: Stable cardiomegaly. No pneumothorax or pulmonary edema. No pleural effusion or acute pulmonary opacity identified. Tortuous thoracic aorta, mediastinal contours are stable. Visualized tracheal air column is within normal limits. No acute osseous abnormality identified.  IMPRESSION: Stable cardiomegaly. No acute cardiopulmonary abnormality.   Electronically Signed   By: Genevie Ann M.D.   On: 12/04/2014 09:52   Ct Angio Chest Pe W/cm &/or Wo Cm  12/04/2014   CLINICAL DATA:  64 year old male with shortness of breath nausea vomiting and diarrhea since dialysis yesterday. Initial encounter.  EXAM: CT ANGIOGRAPHY CHEST WITH CONTRAST  TECHNIQUE: Multidetector CT imaging of the chest was performed using the standard protocol during bolus administration of intravenous contrast. Multiplanar CT image reconstructions and MIPs were obtained to evaluate the vascular anatomy.  CONTRAST:  139mL OMNIPAQUE IOHEXOL 350 MG/ML SOLN  COMPARISON:  CT Abdomen and Pelvis 07/20/2014. Chest radiographs 0929 hours today.  FINDINGS: Adequate contrast bolus timing in the pulmonary arterial tree.  No focal filling defect identified in the pulmonary arterial tree to suggest the presence of acute pulmonary embolism.  Chronic cardiomegaly and tortuous thoracic aorta with calcified atherosclerosis. The distal arch and proximal descending segment measure up to 40-42 mm diameter. Coronary artery calcified plaque. No pericardial effusion. No mediastinal or hilar lymphadenopathy.  Thyromegaly greater on the right. No discrete thyroid nodule. No axillary lymphadenopathy.  Major airways are patent. Minor dependent atelectasis on the left. Otherwise the lungs are clear. No pleural effusion.  Visualized liver, spleen, adrenal glands and  bowel in the upper abdomen are stable.  No acute osseous abnormality identified. Small bone island in the left lateral fourth rib. Flowing osteophytes in the spine.  Review of the MIP images confirms the above findings.  IMPRESSION: 1. No evidence of acute pulmonary embolus. No acute pulmonary findings. 2. Ectatic thoracic aorta. Distal arch/proximal descending segment up to 40-42 mm diameter. Recommend annual imaging followup by CTA or MRA. This recommendation follows 2010 ACCF/AHA/AATS/ACR/ASA/SCA/SCAI/SIR/STS/SVM Guidelines for the Diagnosis and Management of Patients with Thoracic Aortic Disease. Circulation. 2010; 121: Q825-O037 3. Cardiomegaly.  Calcified coronary  artery atherosclerosis.   Electronically Signed   By: Genevie Ann M.D.   On: 12/04/2014 13:53    EKG: Independently reviewed. Sinus rhythm nonspecific T abnormalities, lateral leads   Assessment/Plan Principal Problem:   Elevated troponin: In patient with end-stage renal disease and nonspecific EKG changes. Will admit to telemetry. Will cycle reactive enzymes and get serial EKGs. Evaluated by Dr. branch cardiology while in the emergency department 20 pons likely a viral syndrome but recommends overnight admission. Await 2-D echo that was ordered by Dr. Harl Bowie. Continue aspirin. Patient denies any chest pain or pain of any kind. Of note Dr. Arrie Eastern consult note indicates he will be on call this weekend and he left his phone number should any further follow-up be warranted due to results of above. Active Problems:  Chronic kidney disease end-stage. Patient is a Tuesday Thursday Saturday dialysis patient. He has not missed any dialysis. Nephrology consulted by ED physician. May need inpatient dialysis tomorrow before likely discharge.    Hypertension: Blood pressure on high end of normal. Home medications include Norvasc, clonidine, metoprolol. Will continue these.    Obstructive sleep apnea: Patient wears see Pap at home. Have requested  same    Gastroesophageal reflux disease: Appears stable at baseline. Continue home meds    Shortness of breath: Resolved at time of my exam. CT of the chest without acute pulmonary embolus. Chest x-ray with stable cardiomegaly. Oxygen saturation level 99% on room air.    Diarrhea: Reports 3-4 episodes since yesterday. Will obtain stool sample and request C. difficile and GI pathogen panel. Enteric precautions until cleared. His other symptoms of nausea, cough nasal congestion and chills will also obtain an influenza panel.    Thrombocytopenia: May be related to chronic kidney disease stage V. Able to ascertain baseline platelet level. Will hold off on anticoagulation and provide SCDs. Will recheck in the morning.    Dr branch cardiology  Code Status: full DVT Prophylaxis: Family Communication: significant other at bedside Disposition Plan: home hopefully 24 hours  Time spent: 37 minutes  [facility] Pager [phone]

## 2014-12-04 NOTE — Progress Notes (Signed)
Patient ID: BOSTON COOKSON, male   DOB: 06-10-1951, 64 y.o.   MRN: [id number]      Primary cardiologist: Previously has seen Dr Jacqulyn Ducking Consulting cardiologist: Dr Carlyle Dolly MD  Clinical Summary Mr. Helms is a 64 y.o.male hx of ESRD, HTN, OSA admitted with abdominal pain with nausea/vomiting/diarrhea along with cough and congestion. Reports he had been feeling fine up until dialysis yesterday. Since then has had nausea along with diarrhea, cough with yellow phlegm, SOB, nasal congestion, and chills. Denies any chest pains.     Admit vitals: p 64 bp 163/97  Cath 12/2010 non-obstructive disease 01/2012 cath negative for ischemia. Normal baseline LVEF. BNP 427, trop 0.21, K 4.5, Cr 10.8, Hgb 11.4, Plt 84 CXR no acute process CT PE no PE, mild dilated aortic root, cardiomegaly EKG sinus brady, LAD, non-specific ST/T changes   Allergies  Allergen Reactions  . Penicillins Itching and Rash    REACTION: Rash and itching     Past Medical History  Diagnosis Date  . Hypertension     Echo in 10/2006-moderate LVH, normal EF  . Obstructive sleep apnea 2008    2008- uses CPAP  . Obesity   . Gout   . Gastroesophageal reflux disease   . Erectile dysfunction   . Prostatic hypertrophy, benign   . Allergic rhinitis   . Difficult intubation     Spasms after LMA inserted -12/13/10  . Chronic kidney disease, stage 5, kidney failure      dialysis at [facility] T/TH/Sat; ESRD as of 05/2011; leg cramps; hypokalemia; microcytosis  . Secondary hyperparathyroidism of renal origin   . Anemia   . Dialysis patient   . History of blood transfusion     Past Surgical History  Procedure Laterality Date  . Total hip arthroplasty      bilateral  . Portacath placement    . Cholecystectomy    . Finger amputation      right middle  . Colonoscopy  07/12/2011    Procedure: COLONOSCOPY;  Surgeon: Dorothyann Peng, MD;  [facility]: [facility];  Service: Endoscopy;  Laterality: N/A;  1:00  . Av  fistula placement  05-31-11    Right radiocephalic AVF  . 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 Joint replacement      times 3  . Av fistula placement  04/01/2012    Procedure: INSERTION OF ARTERIOVENOUS (AV) GORE-TEX GRAFT ARM;  Surgeon: Elam Dutch, MD;  Location: [facility] OR;  Service: Vascular;  Laterality: Left;  . Revison of arteriovenous fistula Left 05/06/2014    Procedure: REVISON OF ARTERIOVENOUS GRAFT;  Surgeon: Conrad Zapata, MD;  Location: [place];  Service: Vascular;  Laterality: Left;  . Shuntogram N/A 10/06/2011    Procedure: Earney Mallet;  Surgeon: Elam Dutch, MD;  Location: [facility] CATH LAB;  Service: Cardiovascular;  Laterality: N/A;    Family History  Problem Relation Age of Onset  . Lung cancer Father 59    hepatic metastases  . Heart attack Mother 17  . Cancer Brother     unk    Social History Mr. Nomura reports that he quit smoking about 39 years ago. His smoking use included Cigarettes. He has a 10 pack-year smoking history. He has never used smokeless tobacco. Mr. Strickling reports that he does not drink alcohol.  Review of Systems CONSTITUTIONAL: No weight loss, fever, chills, weakness or fatigue.  HEENT: Eyes: No visual loss,  blurred vision, double vision or yellow sclerae. No hearing loss, sneezing, congestion, runny nose or sore throat.  SKIN: No rash or itching.  CARDIOVASCULAR: no chest pain, no palpitations.  RESPIRATORY: per HPI GASTROINTESTINAL: per HPI GENITOURINARY: no polyuria, no dysuria NEUROLOGICAL: No headache, dizziness, syncope, paralysis, ataxia, numbness or tingling in the extremities. No change in bowel or bladder control.  MUSCULOSKELETAL: No muscle, back pain, joint pain or stiffness.  HEMATOLOGIC: No anemia, bleeding or bruising.  LYMPHATICS: No enlarged nodes. No history of splenectomy.  PSYCHIATRIC: No history of depression or anxiety.      Physical Examination Blood pressure 161/94,  pulse 57, temperature 98.2 F (36.8 C), temperature source Oral, resp. rate 16, SpO2 100 %. No intake or output data in the 24 hours ending 12/04/14 1436  HEENT: sclera clear  Cardiovascular: RRR, 2/6 systolic murmur RUSB, no JVD  Respiratory: CTAB  GI: abdomen soft, NT, ND  MSK: no LE edema  Neuro: no focal deficits  Psych: appropriate affect   Lab Results  Basic Metabolic Panel:  Recent Labs Lab 12/04/14 0903  NA 135  K 4.5  CL 96  CO2 27  GLUCOSE 93  BUN 39*  CREATININE 10.84*  CALCIUM 8.9    Liver Function Tests:  Recent Labs Lab 12/04/14 0903  AST 12  ALT 11  ALKPHOS 56  BILITOT 0.7  PROT 6.4  ALBUMIN 3.6    CBC:  Recent Labs Lab 12/04/14 0903  WBC 8.8  NEUTROABS 7.1  HGB 11.4*  HCT 35.8*  MCV 83.1  PLT 84*    Cardiac Enzymes:  Recent Labs Lab 12/04/14 0903  TROPONINI 0.21*    BNP: Invalid input(s): POCBNP      Impression/Recommendations  1. Elevated troponin - non-specific finding in this of ESRD and no specific cardiac symptoms. Multiple systemic symptoms most consistent with likely viral syndrome, could potentially have a component of viral perimyocarditis or increased demand causing the troponin elevation. EKG non-specific findings, no chest pain. CT PE negative.  - admit overnight and cycle EKG and cardiac enzymes. Will obtain echo. Resume home bp meds, start ASA. Would not anticoag at this time - further management pending repeat EKG, trops, and echo. I am on call at [facility] this weekend, can be contacted at [phone] to further discuss patient.      Carlyle Dolly, M.D.

## 2014-12-04 NOTE — ED Provider Notes (Signed)
CSN: [id number]     Arrival date & time 12/04/14  0808 History   First MD Initiated Contact with Patient 12/04/14 0818     Chief Complaint  Patient presents with  . Abdominal Pain     (Consider location/radiation/quality/duration/timing/severity/associated sxs/prior Treatment) HPI Comments: Patient presents to the emergency department for evaluation of nausea, vomiting and diarrhea. Patient reports symptoms began yesterday. No blood in vomit or stools. He is not expressing any abdominal pain associated with symptoms. Patient does report that he is short of breath. He has had cough and congestion in addition to the above-mentioned symptoms. He does not think he has had a fever.  Patient is a 64 y.o. male presenting with abdominal pain.  Abdominal Pain Associated symptoms: diarrhea, nausea, shortness of breath and vomiting     Past Medical History  Diagnosis Date  . Hypertension     Echo in 10/2006-moderate LVH, normal EF  . Obstructive sleep apnea 2008    2008- uses CPAP  . Obesity   . Gout   . Gastroesophageal reflux disease   . Erectile dysfunction   . Prostatic hypertrophy, benign   . Allergic rhinitis   . Difficult intubation     Spasms after LMA inserted -12/13/10  . Chronic kidney disease, stage 5, kidney failure      dialysis at [facility] T/TH/Sat; ESRD as of 05/2011; leg cramps; hypokalemia; microcytosis  . Secondary hyperparathyroidism of renal origin   . Anemia   . Dialysis patient   . History of blood transfusion    Past Surgical History  Procedure Laterality Date  . Total hip arthroplasty      bilateral  . Portacath placement    . Cholecystectomy    . Finger amputation      right middle  . Colonoscopy  07/12/2011    Procedure: COLONOSCOPY;  Surgeon: Dorothyann Peng, MD;  Location: [facility];  Service: Endoscopy;  Laterality: N/A;  1:00  . Av fistula placement  05-31-11    Right radiocephalic AVF  . Colostomy      + subsequent takedown secondary to colonic  rupture related to trauma  . Joint replacement      times 3  . Av fistula placement  04/01/2012    Procedure: INSERTION OF ARTERIOVENOUS (AV) GORE-TEX GRAFT ARM;  Surgeon: Elam Dutch, MD;  Location: [facility] OR;  Service: Vascular;  Laterality: Left;  . Revison of arteriovenous fistula Left 05/06/2014    Procedure: REVISON OF ARTERIOVENOUS GRAFT;  Surgeon: Conrad Laymantown, MD;  Location: [place];  Service: Vascular;  Laterality: Left;  . Shuntogram N/A 10/06/2011    Procedure: Earney Mallet;  Surgeon: Elam Dutch, MD;  Location: [facility] CATH LAB;  Service: Cardiovascular;  Laterality: N/A;   Family History  Problem Relation Age of Onset  . Lung cancer Father 29    hepatic metastases  . Heart attack Mother 71  . Cancer Brother     unk   History  Substance Use Topics  . Smoking status: Former Smoker -- 1.00 packs/day for 10 years    Types: Cigarettes    Quit date: 01/11/1975  . Smokeless tobacco: Never Used  . Alcohol Use: No    Review of Systems  Respiratory: Positive for shortness of breath.   Gastrointestinal: Positive for nausea, vomiting, abdominal pain and diarrhea.  All other systems reviewed and are negative.     Allergies  Penicillins  Home Medications   Prior to Admission medications   Medication Sig Start  Date End Date Taking? Authorizing Provider  allopurinol (ZYLOPRIM) 100 MG tablet Take 100 mg by mouth every evening.  06/27/13  Yes Historical Provider, MD  amLODipine (NORVASC) 10 MG tablet Take 10 mg by mouth every morning.    Yes Historical Provider, MD  b complex-vitamin c-folic acid (NEPHRO-VITE) 0.8 MG TABS Take 0.8 mg by mouth every morning.  11/13/11  Yes Historical Provider, MD  calcitRIOL (ROCALTROL) 0.5 MCG capsule Take 1 capsule by mouth daily. 11/04/10  Yes Historical Provider, MD  calcium carbonate (TUMS - DOSED IN MG ELEMENTAL CALCIUM) 500 MG chewable tablet Chew 1-4 tablets by mouth 3 (three) times daily with meals. Take 4 tablets with largest meal of the day  and 1-2 with smaller meals and snacks   Yes Historical Provider, MD  cloNIDine (CATAPRES) 0.3 MG tablet Take 0.3 mg by mouth 2 (two) times daily.   Yes Historical Provider, MD  metoprolol (LOPRESSOR) 50 MG tablet Take 50 mg by mouth 2 (two) times daily. 04/09/13  Yes Historical Provider, MD  triamcinolone cream (KENALOG) 0.1 % Apply 1 application topically daily as needed (for itching).    Yes Historical Provider, MD  HYDROcodone-acetaminophen (NORCO) 5-325 MG per tablet Take 1-2 tablets by mouth every 4 (four) hours as needed (for pain). Patient not taking: Reported on 12/04/2014 07/01/14   John Molpus, MD   BP 168/105 mmHg  Pulse 61  Temp(Src) 98.2 F (36.8 C) (Oral)  Resp 23  SpO2 100% Physical Exam  Constitutional: He is oriented to person, place, and time. He appears well-developed and well-nourished. No distress.  HENT:  Head: Normocephalic and atraumatic.  Right Ear: Hearing normal.  Left Ear: Hearing normal.  Nose: Nose normal.  Mouth/Throat: Oropharynx is clear and moist and mucous membranes are normal.  Eyes: Conjunctivae and EOM are normal. Pupils are equal, round, and reactive to light.  Neck: Normal range of motion. Neck supple.  Cardiovascular: Regular rhythm, S1 normal and S2 normal.  Exam reveals no gallop and no friction rub.   No murmur heard. Pulmonary/Chest: Effort normal and breath sounds normal. No respiratory distress. He exhibits no tenderness.  Abdominal: Soft. Normal appearance and bowel sounds are normal. There is no hepatosplenomegaly. There is no tenderness. There is no rebound, no guarding, no tenderness at McBurney's point and negative Murphy's sign. No hernia.  Musculoskeletal: Normal range of motion.  Neurological: He is alert and oriented to person, place, and time. He has normal strength. No cranial nerve deficit or sensory deficit. Coordination normal. GCS eye subscore is 4. GCS verbal subscore is 5. GCS motor subscore is 6.  Skin: Skin is warm, dry and  intact. No rash noted. No cyanosis.  Psychiatric: He has a normal mood and affect. His speech is normal and behavior is normal. Thought content normal.  Nursing note and vitals reviewed.   ED Course  Procedures (including critical care time) Labs Review Labs Reviewed  CBC WITH DIFFERENTIAL/PLATELET - Abnormal; Notable for the following:    Hemoglobin 11.4 (*)    HCT 35.8 (*)    RDW 16.5 (*)    Platelets 84 (*)    Neutrophils Relative % 81 (*)    Lymphocytes Relative 6 (*)    Lymphs Abs 0.5 (*)    Monocytes Absolute 1.1 (*)    All other components within normal limits  COMPREHENSIVE METABOLIC PANEL - Abnormal; Notable for the following:    BUN 39 (*)    Creatinine, Ser 10.84 (*)    GFR calc non  Af Amer 4 (*)    GFR calc Af Amer 5 (*)    All other components within normal limits  TROPONIN I - Abnormal; Notable for the following:    Troponin I 0.21 (*)    All other components within normal limits  BRAIN NATRIURETIC PEPTIDE - Abnormal; Notable for the following:    B Natriuretic Peptide 427.0 (*)    All other components within normal limits  TROPONIN I  I-STAT CG4 LACTIC ACID, ED    Imaging Review Dg Chest 2 View  12/04/2014   CLINICAL DATA:  64 year old male with shortness of breath since yesterday. Initial encounter. Current history of chronic kidney disease on dialysis.  EXAM: CHEST  2 VIEW  COMPARISON:  CT Abdomen and Pelvis 07/20/2014 chest radiographs 05/06/2014 and earlier.  FINDINGS: Stable cardiomegaly. No pneumothorax or pulmonary edema. No pleural effusion or acute pulmonary opacity identified. Tortuous thoracic aorta, mediastinal contours are stable. Visualized tracheal air column is within normal limits. No acute osseous abnormality identified.  IMPRESSION: Stable cardiomegaly. No acute cardiopulmonary abnormality.   Electronically Signed   By: Genevie Ann M.D.   On: 12/04/2014 09:52   Ct Angio Chest Pe W/cm &/or Wo Cm  12/04/2014   CLINICAL DATA:  64 year old male with  shortness of breath nausea vomiting and diarrhea since dialysis yesterday. Initial encounter.  EXAM: CT ANGIOGRAPHY CHEST WITH CONTRAST  TECHNIQUE: Multidetector CT imaging of the chest was performed using the standard protocol during bolus administration of intravenous contrast. Multiplanar CT image reconstructions and MIPs were obtained to evaluate the vascular anatomy.  CONTRAST:  162mL OMNIPAQUE IOHEXOL 350 MG/ML SOLN  COMPARISON:  CT Abdomen and Pelvis 07/20/2014. Chest radiographs 0929 hours today.  FINDINGS: Adequate contrast bolus timing in the pulmonary arterial tree.  No focal filling defect identified in the pulmonary arterial tree to suggest the presence of acute pulmonary embolism.  Chronic cardiomegaly and tortuous thoracic aorta with calcified atherosclerosis. The distal arch and proximal descending segment measure up to 40-42 mm diameter. Coronary artery calcified plaque. No pericardial effusion. No mediastinal or hilar lymphadenopathy.  Thyromegaly greater on the right. No discrete thyroid nodule. No axillary lymphadenopathy.  Major airways are patent. Minor dependent atelectasis on the left. Otherwise the lungs are clear. No pleural effusion.  Visualized liver, spleen, adrenal glands and bowel in the upper abdomen are stable.  No acute osseous abnormality identified. Small bone island in the left lateral fourth rib. Flowing osteophytes in the spine.  Review of the MIP images confirms the above findings.  IMPRESSION: 1. No evidence of acute pulmonary embolus. No acute pulmonary findings. 2. Ectatic thoracic aorta. Distal arch/proximal descending segment up to 40-42 mm diameter. Recommend annual imaging followup by CTA or MRA. This recommendation follows 2010 ACCF/AHA/AATS/ACR/ASA/SCA/SCAI/SIR/STS/SVM Guidelines for the Diagnosis and Management of Patients with Thoracic Aortic Disease. Circulation. 2010; 121: D622-W979 3. Cardiomegaly.  Calcified coronary artery atherosclerosis.   Electronically  Signed   By: Genevie Ann M.D.   On: 12/04/2014 13:53     EKG Interpretation   Date/Time:  Friday December 04 2014 09:32:58 EDT Ventricular Rate:  56 PR Interval:  202 QRS Duration: 109 QT Interval:  462 QTC Calculation: 446 R Axis:   -9 Text Interpretation:  Sinus rhythm Nonspecific T abnormalities, lateral  leads No significant change since last tracing Confirmed by Jessi Jessop  MD,  Jossiah Smoak [phone]) on 12/04/2014 10:39:09 AM      MDM   Final diagnoses:  Shortness of breath  Elevated troponin  Patient presented to the ER for evaluation of nausea, vomiting and diarrhea. Symptoms began yesterday. He has not been running a fever. Patient does report that he has been very short of breath. He may have had some minor chest congestion, but is not having any significant coughing currently. X-ray did not show any evidence of pneumonia. He does not have any evidence of significant volume overload at this time. His EKG was unchanged from previous but his troponin is elevated. This might be secondary to his renal failure, but cannot rule out an STEMI versus viral myocarditis versus demand ischemia. Consultation by Dr. branch appreciated. Patient to be admitted to the hospital, cycle enzymes.    Orpah Greek, MD 12/04/14 [phone]

## 2014-12-04 NOTE — ED Notes (Signed)
Pt complain of n/v/d since yesterday.

## 2014-12-04 NOTE — ED Notes (Signed)
Report given to receiving RN by Marliss Coots, RN.

## 2014-12-04 NOTE — Progress Notes (Signed)
Discussed CPAP use with patient. Patient stated that he has one at home that he doesn't use. He declined using a hospital CPAP unit while he was here because he said he shouldn't be here long. Told patient to call if he changed his mind and wanted to wear one. No unit in room at this time.

## 2014-12-05 DIAGNOSIS — B349 Viral infection, unspecified: Secondary | ICD-10-CM | POA: Diagnosis present

## 2014-12-05 DIAGNOSIS — J069 Acute upper respiratory infection, unspecified: Secondary | ICD-10-CM | POA: Diagnosis not present

## 2014-12-05 DIAGNOSIS — E875 Hyperkalemia: Secondary | ICD-10-CM | POA: Diagnosis not present

## 2014-12-05 DIAGNOSIS — R0600 Dyspnea, unspecified: Secondary | ICD-10-CM | POA: Diagnosis not present

## 2014-12-05 DIAGNOSIS — R0602 Shortness of breath: Secondary | ICD-10-CM | POA: Diagnosis not present

## 2014-12-05 DIAGNOSIS — D696 Thrombocytopenia, unspecified: Secondary | ICD-10-CM | POA: Diagnosis not present

## 2014-12-05 DIAGNOSIS — R197 Diarrhea, unspecified: Secondary | ICD-10-CM | POA: Diagnosis not present

## 2014-12-05 LAB — URINALYSIS, ROUTINE W REFLEX MICROSCOPIC
BILIRUBIN URINE: NEGATIVE
Glucose, UA: 250 mg/dL — AB
KETONES UR: NEGATIVE mg/dL
Leukocytes, UA: NEGATIVE
Nitrite: NEGATIVE
PH: 8.5 — AB (ref 5.0–8.0)
Protein, ur: 100 mg/dL — AB
SPECIFIC GRAVITY, URINE: 1.015 (ref 1.005–1.030)
Urobilinogen, UA: 0.2 mg/dL (ref 0.0–1.0)

## 2014-12-05 LAB — TROPONIN I: Troponin I: 0.22 ng/mL — ABNORMAL HIGH (ref <0.031)

## 2014-12-05 LAB — CLOSTRIDIUM DIFFICILE BY PCR: CDIFFPCR: NEGATIVE

## 2014-12-05 LAB — COMPREHENSIVE METABOLIC PANEL
ALBUMIN: 3.6 g/dL (ref 3.5–5.2)
ALK PHOS: 67 U/L (ref 39–117)
ALT: 11 U/L (ref 0–53)
ANION GAP: 14 (ref 5–15)
AST: 10 U/L (ref 0–37)
BUN: 57 mg/dL — ABNORMAL HIGH (ref 6–23)
CO2: 26 mmol/L (ref 19–32)
Calcium: 8.9 mg/dL (ref 8.4–10.5)
Chloride: 95 mmol/L — ABNORMAL LOW (ref 96–112)
Creatinine, Ser: 12.62 mg/dL — ABNORMAL HIGH (ref 0.50–1.35)
GFR calc Af Amer: 4 mL/min — ABNORMAL LOW (ref >90)
GFR calc non Af Amer: 4 mL/min — ABNORMAL LOW (ref >90)
Glucose, Bld: 94 mg/dL (ref 70–99)
POTASSIUM: 5.6 mmol/L — AB (ref 3.5–5.1)
SODIUM: 135 mmol/L (ref 135–145)
Total Bilirubin: 0.5 mg/dL (ref 0.3–1.2)
Total Protein: 6.6 g/dL (ref 6.0–8.3)

## 2014-12-05 LAB — CBC
HCT: 36.1 % — ABNORMAL LOW (ref 39.0–52.0)
HEMOGLOBIN: 11.7 g/dL — AB (ref 13.0–17.0)
MCH: 26.5 pg (ref 26.0–34.0)
MCHC: 32.4 g/dL (ref 30.0–36.0)
MCV: 81.9 fL (ref 78.0–100.0)
Platelets: 76 10*3/uL — ABNORMAL LOW (ref 150–400)
RBC: 4.41 MIL/uL (ref 4.22–5.81)
RDW: 16.6 % — ABNORMAL HIGH (ref 11.5–15.5)
WBC: 6.1 10*3/uL (ref 4.0–10.5)

## 2014-12-05 LAB — URINE MICROSCOPIC-ADD ON

## 2014-12-05 LAB — VITAMIN B12: Vitamin B-12: 367 pg/mL (ref 211–911)

## 2014-12-05 MED ORDER — LIDOCAINE-PRILOCAINE 2.5-2.5 % EX CREA: 1.0000 "application " | TOPICAL_CREAM | CUTANEOUS | Status: DC | PRN

## 2014-12-05 MED ORDER — ALTEPLASE 2 MG IJ SOLR
2.0000 mg | Freq: Once | INTRAMUSCULAR | Status: DC | PRN
  Filled 2014-12-05: qty 2

## 2014-12-05 MED ORDER — BENZONATATE 100 MG PO CAPS: 100.0000 mg | ORAL_CAPSULE | Freq: Three times a day (TID) | ORAL | Status: DC | PRN

## 2014-12-05 MED ORDER — NEPRO/CARBSTEADY PO LIQD: 237.0000 mL | ORAL | Status: DC | PRN

## 2014-12-05 MED ORDER — HEPARIN SODIUM (PORCINE) 1000 UNIT/ML DIALYSIS
1000.0000 [IU] | INTRAMUSCULAR | Status: DC | PRN
  Filled 2014-12-05: qty 1

## 2014-12-05 MED ORDER — SODIUM CHLORIDE 0.9 % IV SOLN: 100.0000 mL | INTRAVENOUS | Status: DC | PRN

## 2014-12-05 MED ORDER — LIDOCAINE HCL (PF) 1 % IJ SOLN: 5.0000 mL | INTRAMUSCULAR | Status: DC | PRN

## 2014-12-05 MED ORDER — PENTAFLUOROPROP-TETRAFLUOROETH EX AERO: 1.0000 "application " | INHALATION_SPRAY | CUTANEOUS | Status: DC | PRN

## 2014-12-05 NOTE — Consult Note (Signed)
Reason for Consult: End-stage renal disease Referring Physician: Dr. Benjaman Kane is an 64 y.o. male.  HPI: He is a patient with history of hypertension, obstructive sleep apnea, end-stage renal disease on maintenance hemodialysis presently came with complaints of cough nonproductive, fever or, chills for a couple of days duration. He has also history of some nasal congestion and difficulty breathing. Patient also has some nausea and vomiting but no diarrhea. Presently patient says that he is feeling much better. He doesn't have any nausea or vomiting. Appetite is good. Patient also denies any chest pain. History of heart dialysis days are Tuesday, Thursday and Saturday .  Past Medical History  Diagnosis Date  . Hypertension     Echo in 10/2006-moderate LVH, normal EF  . Obstructive sleep apnea 2008    2008- uses CPAP  . Obesity   . Gout   . Gastroesophageal reflux disease   . Erectile dysfunction   . Prostatic hypertrophy, benign   . Allergic rhinitis   . Difficult intubation     Spasms after LMA inserted -12/13/10  . Chronic kidney disease, stage 5, kidney failure      dialysis at [facility] T/TH/Sat; ESRD as of 05/2011; leg cramps; hypokalemia; microcytosis  . Secondary hyperparathyroidism of renal origin   . Anemia   . Dialysis patient   . History of blood transfusion   . Thrombocytopenia   . Elevated troponin     Past Surgical History  Procedure Laterality Date  . Total hip arthroplasty      bilateral  . Portacath placement    . Cholecystectomy    . Finger amputation      right middle  . Colonoscopy  07/12/2011    Procedure: COLONOSCOPY;  Surgeon: Dorothyann Peng, MD;  Location: [facility];  Service: Endoscopy;  Laterality: N/A;  1:00  . Av fistula placement  05-31-11    Right radiocephalic AVF  . 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 Joint replacement      times 3  . Av fistula placement  04/01/2012     Procedure: INSERTION OF ARTERIOVENOUS (AV) GORE-TEX GRAFT ARM;  Surgeon: Elam Dutch, MD;  Location: [facility] OR;  Service: Vascular;  Laterality: Left;  . Revison of arteriovenous fistula Left 05/06/2014    Procedure: REVISON OF ARTERIOVENOUS GRAFT;  Surgeon: Conrad Rhineland, MD;  Location: [place];  Service: Vascular;  Laterality: Left;  . Shuntogram N/A 10/06/2011    Procedure: Earney Mallet;  Surgeon: Elam Dutch, MD;  Location: [facility] CATH LAB;  Service: Cardiovascular;  Laterality: N/A;    Family History  Problem Relation Age of Onset  . Lung cancer Father 34    hepatic metastases  . Heart attack Mother 62  . Cancer Brother     unk    Social History:  reports that he quit smoking about 39 years ago. His smoking use included Cigarettes. He has a 10 pack-year smoking history. He has never used smokeless tobacco. He reports that he does not drink alcohol or use illicit drugs.  Allergies:  Allergies  Allergen Reactions  . Penicillins Itching and Rash    REACTION: Rash and itching    Medications: I have reviewed the patient's current medications.  Results for orders placed or performed during the hospital encounter of 12/04/14 (from the past 48 hour(s))  CBC with Differential/Platelet     Status: Abnormal   Collection Time: 12/04/14  9:03 AM  Result Value Ref Range   WBC 8.8 4.0 - 10.5 K/uL   RBC 4.31 4.22 - 5.81 MIL/uL   Hemoglobin 11.4 (L) 13.0 - 17.0 g/dL   HCT 35.8 (L) 39.0 - 52.0 %   MCV 83.1 78.0 - 100.0 fL   MCH 26.5 26.0 - 34.0 pg   MCHC 31.8 30.0 - 36.0 g/dL   RDW 16.5 (H) 11.5 - 15.5 %   Platelets 84 (L) 150 - 400 K/uL    Comment: SPECIMEN CHECKED FOR CLOTS PLATELET COUNT CONFIRMED BY SMEAR    Neutrophils Relative % 81 (H) 43 - 77 %   Neutro Abs 7.1 1.7 - 7.7 K/uL   Lymphocytes Relative 6 (L) 12 - 46 %   Lymphs Abs 0.5 (L) 0.7 - 4.0 K/uL   Monocytes Relative 12 3 - 12 %   Monocytes Absolute 1.1 (H) 0.1 - 1.0 K/uL   Eosinophils Relative 1 0 - 5 %   Eosinophils  Absolute 0.0 0.0 - 0.7 K/uL   Basophils Relative 0 0 - 1 %   Basophils Absolute 0.0 0.0 - 0.1 K/uL   Smear Review PLATELETS APPEAR DECREASED   Comprehensive metabolic panel     Status: Abnormal   Collection Time: 12/04/14  9:03 AM  Result Value Ref Range   Sodium 135 135 - 145 mmol/L   Potassium 4.5 3.5 - 5.1 mmol/L   Chloride 96 96 - 112 mmol/L   CO2 27 19 - 32 mmol/L   Glucose, Bld 93 70 - 99 mg/dL   BUN 39 (H) 6 - 23 mg/dL   Creatinine, Ser 10.84 (H) 0.50 - 1.35 mg/dL   Calcium 8.9 8.4 - 10.5 mg/dL   Total Protein 6.4 6.0 - 8.3 g/dL   Albumin 3.6 3.5 - 5.2 g/dL   AST 12 0 - 37 U/L   ALT 11 0 - 53 U/L   Alkaline Phosphatase 56 39 - 117 U/L   Total Bilirubin 0.7 0.3 - 1.2 mg/dL   GFR calc non Af Amer 4 (L) >90 mL/min   GFR calc Af Amer 5 (L) >90 mL/min    Comment: (NOTE) The eGFR has been calculated using the CKD EPI equation. This calculation has not been validated in all clinical situations. eGFR's persistently <90 mL/min signify possible Chronic Kidney Disease.    Anion gap 12 5 - 15  Troponin I     Status: Abnormal   Collection Time: 12/04/14  9:03 AM  Result Value Ref Range   Troponin I 0.21 (H) <0.031 ng/mL    Comment:        PERSISTENTLY INCREASED TROPONIN VALUES IN THE RANGE OF 0.04-0.49 ng/mL CAN BE SEEN IN:       -UNSTABLE ANGINA       -CONGESTIVE HEART FAILURE       -MYOCARDITIS       -CHEST TRAUMA       -ARRYHTHMIAS       -LATE PRESENTING MYOCARDIAL INFARCTION       -COPD   CLINICAL FOLLOW-UP RECOMMENDED.   Brain natriuretic peptide     Status: Abnormal   Collection Time: 12/04/14  9:03 AM  Result Value Ref Range   B Natriuretic Peptide 427.0 (H) 0.0 - 100.0 pg/mL  TSH     Status: Abnormal   Collection Time: 12/04/14  9:06 AM  Result Value Ref Range   TSH 0.311 (L) 0.350 - 4.500 uIU/mL  Vitamin B12     Status: None   Collection  Time: 12/04/14  9:06 AM  Result Value Ref Range   Vitamin B-12 367 211 - 911 pg/mL    Comment: Performed at [facility] Lactic Acid, ED     Status: None   Collection Time: 12/04/14  9:26 AM  Result Value Ref Range   Lactic Acid, Venous 0.85 0.5 - 2.0 mmol/L  Troponin I     Status: Abnormal   Collection Time: 12/04/14  3:53 PM  Result Value Ref Range   Troponin I 0.20 (H) <0.031 ng/mL    Comment:        PERSISTENTLY INCREASED TROPONIN VALUES IN THE RANGE OF 0.04-0.49 ng/mL CAN BE SEEN IN:       -UNSTABLE ANGINA       -CONGESTIVE HEART FAILURE       -MYOCARDITIS       -CHEST TRAUMA       -ARRYHTHMIAS       -LATE PRESENTING MYOCARDIAL INFARCTION       -COPD   CLINICAL FOLLOW-UP RECOMMENDED.   Influenza panel by PCR (type A & B, H1N1)     Status: None   Collection Time: 12/04/14  8:20 PM  Result Value Ref Range   Influenza A By PCR NEGATIVE NEGATIVE   Influenza B By PCR NEGATIVE NEGATIVE   H1N1 flu by pcr NOT DETECTED NOT DETECTED    Comment:        The Xpert Flu assay (FDA approved for nasal aspirates or washes and nasopharyngeal swab specimens), is intended as an aid in the diagnosis of influenza and should not be used as a sole basis for treatment.   MRSA PCR Screening     Status: None   Collection Time: 12/04/14  9:10 PM  Result Value Ref Range   MRSA by PCR NEGATIVE NEGATIVE    Comment:        The GeneXpert MRSA Assay (FDA approved for NASAL specimens only), is one component of a comprehensive MRSA colonization surveillance program. It is not intended to diagnose MRSA infection nor to guide or monitor treatment for MRSA infections.   Troponin I     Status: Abnormal   Collection Time: 12/04/14  9:38 PM  Result Value Ref Range   Troponin I 0.21 (H) <0.031 ng/mL    Comment:        PERSISTENTLY INCREASED TROPONIN VALUES IN THE RANGE OF 0.04-0.49 ng/mL CAN BE SEEN IN:       -UNSTABLE ANGINA       -CONGESTIVE HEART FAILURE       -MYOCARDITIS       -CHEST TRAUMA       -ARRYHTHMIAS       -LATE PRESENTING MYOCARDIAL INFARCTION       -COPD   CLINICAL  FOLLOW-UP RECOMMENDED.   Urinalysis, Routine w reflex microscopic     Status: Abnormal   Collection Time: 12/05/14  6:08 AM  Result Value Ref Range   Color, Urine YELLOW YELLOW   APPearance HAZY (A) CLEAR   Specific Gravity, Urine 1.015 1.005 - 1.030   pH 8.5 (H) 5.0 - 8.0   Glucose, UA 250 (A) NEGATIVE mg/dL   Hgb urine dipstick TRACE (A) NEGATIVE   Bilirubin Urine NEGATIVE NEGATIVE   Ketones, ur NEGATIVE NEGATIVE mg/dL   Protein, ur 100 (A) NEGATIVE mg/dL   Urobilinogen, UA 0.2 0.0 - 1.0 mg/dL   Nitrite NEGATIVE NEGATIVE   Leukocytes, UA NEGATIVE NEGATIVE  Urine microscopic-add on  Status: Abnormal   Collection Time: 12/05/14  6:08 AM  Result Value Ref Range   Squamous Epithelial / LPF FEW (A) RARE   WBC, UA 3-6 <3 WBC/hpf   RBC / HPF 0-2 <3 RBC/hpf   Bacteria, UA RARE RARE  Comprehensive metabolic panel     Status: Abnormal   Collection Time: 12/05/14  6:11 AM  Result Value Ref Range   Sodium 135 135 - 145 mmol/L   Potassium 5.6 (H) 3.5 - 5.1 mmol/L    Comment: DELTA CHECK NOTED   Chloride 95 (L) 96 - 112 mmol/L   CO2 26 19 - 32 mmol/L   Glucose, Bld 94 70 - 99 mg/dL   BUN 57 (H) 6 - 23 mg/dL   Creatinine, Ser 12.62 (H) 0.50 - 1.35 mg/dL   Calcium 8.9 8.4 - 10.5 mg/dL   Total Protein 6.6 6.0 - 8.3 g/dL   Albumin 3.6 3.5 - 5.2 g/dL   AST 10 0 - 37 U/L   ALT 11 0 - 53 U/L   Alkaline Phosphatase 67 39 - 117 U/L   Total Bilirubin 0.5 0.3 - 1.2 mg/dL   GFR calc non Af Amer 4 (L) >90 mL/min   GFR calc Af Amer 4 (L) >90 mL/min    Comment: (NOTE) The eGFR has been calculated using the CKD EPI equation. This calculation has not been validated in all clinical situations. eGFR's persistently <90 mL/min signify possible Chronic Kidney Disease.    Anion gap 14 5 - 15  CBC     Status: Abnormal   Collection Time: 12/05/14  6:11 AM  Result Value Ref Range   WBC 6.1 4.0 - 10.5 K/uL   RBC 4.41 4.22 - 5.81 MIL/uL   Hemoglobin 11.7 (L) 13.0 - 17.0 g/dL   HCT 36.1 (L) 39.0 -  52.0 %   MCV 81.9 78.0 - 100.0 fL   MCH 26.5 26.0 - 34.0 pg   MCHC 32.4 30.0 - 36.0 g/dL   RDW 16.6 (H) 11.5 - 15.5 %   Platelets 76 (L) 150 - 400 K/uL    Comment: SPECIMEN CHECKED FOR CLOTS CONSISTENT WITH PREVIOUS RESULT   Troponin I     Status: Abnormal   Collection Time: 12/05/14  6:11 AM  Result Value Ref Range   Troponin I 0.22 (H) <0.031 ng/mL    Comment:        PERSISTENTLY INCREASED TROPONIN VALUES IN THE RANGE OF 0.04-0.49 ng/mL CAN BE SEEN IN:       -UNSTABLE ANGINA       -CONGESTIVE HEART FAILURE       -MYOCARDITIS       -CHEST TRAUMA       -ARRYHTHMIAS       -LATE PRESENTING MYOCARDIAL INFARCTION       -COPD   CLINICAL FOLLOW-UP RECOMMENDED.     Dg Chest 2 View  12/04/2014   CLINICAL DATA:  64 year old male with shortness of breath since yesterday. Initial encounter. Current history of chronic kidney disease on dialysis.  EXAM: CHEST  2 VIEW  COMPARISON:  CT Abdomen and Pelvis 07/20/2014 chest radiographs 05/06/2014 and earlier.  FINDINGS: Stable cardiomegaly. No pneumothorax or pulmonary edema. No pleural effusion or acute pulmonary opacity identified. Tortuous thoracic aorta, mediastinal contours are stable. Visualized tracheal air column is within normal limits. No acute osseous abnormality identified.  IMPRESSION: Stable cardiomegaly. No acute cardiopulmonary abnormality.   Electronically Signed   By: Genevie Ann M.D.   On: 12/04/2014 09:52  Ct Angio Chest Pe W/cm &/or Wo Cm  12/04/2014   CLINICAL DATA:  64 year old male with shortness of breath nausea vomiting and diarrhea since dialysis yesterday. Initial encounter.  EXAM: CT ANGIOGRAPHY CHEST WITH CONTRAST  TECHNIQUE: Multidetector CT imaging of the chest was performed using the standard protocol during bolus administration of intravenous contrast. Multiplanar CT image reconstructions and MIPs were obtained to evaluate the vascular anatomy.  CONTRAST:  143m OMNIPAQUE IOHEXOL 350 MG/ML SOLN  COMPARISON:  CT Abdomen  and Pelvis 07/20/2014. Chest radiographs 0929 hours today.  FINDINGS: Adequate contrast bolus timing in the pulmonary arterial tree.  No focal filling defect identified in the pulmonary arterial tree to suggest the presence of acute pulmonary embolism.  Chronic cardiomegaly and tortuous thoracic aorta with calcified atherosclerosis. The distal arch and proximal descending segment measure up to 40-42 mm diameter. Coronary artery calcified plaque. No pericardial effusion. No mediastinal or hilar lymphadenopathy.  Thyromegaly greater on the right. No discrete thyroid nodule. No axillary lymphadenopathy.  Major airways are patent. Minor dependent atelectasis on the left. Otherwise the lungs are clear. No pleural effusion.  Visualized liver, spleen, adrenal glands and bowel in the upper abdomen are stable.  No acute osseous abnormality identified. Small bone island in the left lateral fourth rib. Flowing osteophytes in the spine.  Review of the MIP images confirms the above findings.  IMPRESSION: 1. No evidence of acute pulmonary embolus. No acute pulmonary findings. 2. Ectatic thoracic aorta. Distal arch/proximal descending segment up to 40-42 mm diameter. Recommend annual imaging followup by CTA or MRA. This recommendation follows 2010 ACCF/AHA/AATS/ACR/ASA/SCA/SCAI/SIR/STS/SVM Guidelines for the Diagnosis and Management of Patients with Thoracic Aortic Disease. Circulation. 2010; 121: [id number]. Cardiomegaly.  Calcified coronary artery atherosclerosis.   Electronically Signed   By: HGenevie AnnM.D.   On: 12/04/2014 13:53    Review of Systems  Constitutional: Positive for fever and chills.  Respiratory: Positive for cough and shortness of breath. Negative for sputum production.   Gastrointestinal: Positive for nausea and vomiting.   Blood pressure 155/100, pulse 53, temperature 98.7 F (37.1 C), temperature source Oral, resp. rate 20, height 5' 9.09" (1.755 m), weight 88.86 kg (195 lb 14.4 oz), SpO2 100  %. Physical Exam  Constitutional: He is oriented to person, place, and time. No distress.  Eyes: No scleral icterus.  Neck: No JVD present.  Cardiovascular: Normal rate and regular rhythm.   No murmur heard. Respiratory: No respiratory distress. He has no wheezes.  GI: He exhibits no distension. There is no tenderness.  Musculoskeletal: He exhibits no edema.  Neurological: He is alert and oriented to person, place, and time.    Assessment/Plan: Problem #1 end-stage renal disease: He is status post hemodialysis on Thursday. Presently patient denies any nausea or vomiting. Presently his potassium is normal. Problem #2 hypertension: His blood pressure is reasonably controlled Problem #3 history of cough nonproductive. Presently patient seems to be feeling better. He has slight elevated troponin but patient does not have any chest pain. Problem #4 history of obstructive sleep apnea Problem # 5 history of gout: Patient is on allopurinol Problem #6 anemia: His hemoglobin and hematocrit is within acceptable range. Plan: We'll make arrangements for patient to get dialysis today which is his regular schedule. Patient will be followed by his primary nephrologist Dr. pFlorene Glenas outpatient.  Matthew Kane S 12/05/2014, 8:05 AM

## 2014-12-05 NOTE — Progress Notes (Signed)
  Echocardiogram 2D Echocardiogram has been performed.  Lysle Rubens 12/05/2014, 11:58 AM

## 2014-12-05 NOTE — Care Management (Signed)
Utilization Review completed.  

## 2014-12-05 NOTE — Progress Notes (Signed)
Discharge instructions and prescriptions given, verbalized understanding, out ambulatory in stable condition with wife.

## 2014-12-05 NOTE — Discharge Summary (Signed)
Physician Discharge Summary  Matthew Kane HCW:[id number] DOB: September 28, 1950 DOA: 12/04/2014  PCP: Maggie Font, MD  Admit date: 12/04/2014 Discharge date: 12/05/2014  Time spent: Greater than 30  minutes  Recommendations for Outpatient Follow-up:  1. Patient was instructed to go to outpatient dialysis following discharge, per his request. 2. 2-D echocardiogram results pending at the time of discharge. Results will need to be followed up upon. 3. GI pathogen panel pending at the time of discharge, but C. difficile was negative. 4. Recommend continue monitoring of the patient's platelet count and TSH.  Discharge Diagnoses:  1. Sign symptom complex with shortness of breath, nasal congestion, nausea, diarrhea, secondary to upper respiratory infection/viral syndrome. 2. Mildly elevated troponin I, likely secondary to mild demand ischemia from viral syndrome. --- 2-D echocardiogram results pending at the time of discharge. 3. End-stage renal disease, on hemodialysis. 4. Thrombocytopenia. Etiology was unknown but could be secondary to hemodialysis and/or heparin given during hemodialysis. --- Patient's vitamin B12 level was within normal limits and his TSH was marginally low. 5. Mildly low TSH; not clinically relevant. 6. Essential hypertension. 7. Gastroesophageal reflux disease. 8. Hyperkalemia secondary to end-stage renal disease. 9. Obstructive sleep apnea; the patient along his uses CPAP.   Discharge Condition: Improved.  Diet recommendation: Heart healthy.  Filed Weights   12/04/14 1627 12/05/14 0545  Weight: 88.633 kg (195 lb 6.4 oz) 88.86 kg (195 lb 14.4 oz)    History of present illness:  The patient is an 64 year old man with a history of end-stage renal disease, on hemodialysis, essential hypertension, gastroesophageal reflux disease, and obstructive sleep apnea. He presented to the emergency department on 12/04/2014 with a complaint of fatigue, nausea, diarrhea, shortness of  breath, nasal congestion, and dry cough. He reported being exposed to someone with a cold at hemodialysis. In the ED, he was afebrile and hemodynamically stable. His lab data were significant for creatinine of 10.84, BNP of 427, troponin I of 0.21, hemoglobin of 11.4, and platelet count of 84. CT angiogram of the chest revealed no evidence of pulmonary embolism. He was admitted for further evaluation and management.  Hospital Course:  The patient was started on gentle IV fluids. Symptomatic treatment was given with as needed antiemetics, as needed analgesics, Tessalon Perles for cough, and saline nasal spray for nasal congestion. Cardiologist, Dr. Harl Bowie evaluated the patient in light of his elevated troponin I. Per his assessment, the mildly elevated troponin I was a nonspecific finding in the setting of end-stage renal disease and multiple systemic symptoms consistent with a viral syndrome. He went on to say that he could potentially have a component of viral perimyocarditis or increased demand causing the elevation. Patient's EKG revealed nonspecific findings, he had no complaints of chest pain, and the CT angiogram was negative for PE. Dr. Harl Bowie recommended resuming the patient's home medications and starting aspirin. He ordered a 2-D echocardiogram. The results were pending at the time of discharge. For additional evaluation, a number studies were ordered. His influenza panel was negative. C. difficile PCR was negative. GI pathogen panel was pending at the time of discharge. Vitamin B12 level was within normal limits. His TSH was borderline low at 0.311, which did not appear to be clinically significant, but would need follow-up monitoring in the outpatient setting. His lactic acid level was within normal limits. His platelet count fell to 76, but there was no obvious etiology and no evidence of bleeding. The thrombocytopenia could be secondary to hemodialysis and/or heparin given during hemodialysis. The  patient improved clinically and symptomatically. At the time of discharge, his cough had subsided and he had no further diarrhea or nausea. Although nephrology was consulted, the patient requested to be discharged and to undergo hemodialysis at his usual hemodialysis center in [place]. This was granted. The patient was instructed to go directly to hemodialysis following hospital discharge. He voiced understanding.  Procedures:  2-D echocardiogram, pending  Consultations:  Cardiology  Nephrology  Discharge Exam: Filed Vitals:   12/05/14 1029  BP: 153/99  Pulse: 56  Temp: 97.7 F (36.5 C)  Resp: 20    General: Pleasant alert 64 year old African-American man in no acute distress. Oropharynx with no oropharyngeal erythema or exudates. No facial tenderness over the maxillary sinus areas. Cardiovascular: S1, S2, with a soft systolic murmur and borderline bradycardia. Respiratory: Decreased breath sounds in the bases, otherwise clear. Extremities: No pedal edema; palpable graft left upper extremity.  Discharge Instructions   Discharge Instructions    Diet - low sodium heart healthy    Complete by:  As directed      Discharge instructions    Complete by:  As directed   Please go to your outpatient dialysis today.     Increase activity slowly    Complete by:  As directed           Current Discharge Medication List    START taking these medications   Details  benzonatate (TESSALON) 100 MG capsule Take 1 capsule (100 mg total) by mouth 3 (three) times daily as needed for cough. Qty: 20 capsule, Refills: 0      CONTINUE these medications which have NOT CHANGED   Details  allopurinol (ZYLOPRIM) 100 MG tablet Take 100 mg by mouth every evening.     amLODipine (NORVASC) 10 MG tablet Take 10 mg by mouth every morning.     b complex-vitamin c-folic acid (NEPHRO-VITE) 0.8 MG TABS Take 0.8 mg by mouth every morning.     calcitRIOL (ROCALTROL) 0.5 MCG capsule Take 1 capsule  by mouth daily.    calcium carbonate (TUMS - DOSED IN MG ELEMENTAL CALCIUM) 500 MG chewable tablet Chew 1-4 tablets by mouth 3 (three) times daily with meals. Take 4 tablets with largest meal of the day and 1-2 with smaller meals and snacks    cloNIDine (CATAPRES) 0.3 MG tablet Take 0.3 mg by mouth 2 (two) times daily.    metoprolol (LOPRESSOR) 50 MG tablet Take 50 mg by mouth 2 (two) times daily.    triamcinolone cream (KENALOG) 0.1 % Apply 1 application topically daily as needed (for itching).     HYDROcodone-acetaminophen (NORCO) 5-325 MG per tablet Take 1-2 tablets by mouth every 4 (four) hours as needed (for pain). Qty: 10 tablet, Refills: 0       Allergies  Allergen Reactions  . Penicillins Itching and Rash    REACTION: Rash and itching   Follow-up Information    Follow up with HILL,GERALD K, MD. Schedule an appointment as soon as possible for a visit in 1 week.   Specialty:  Family Medicine   Contact information:   [address] [place] [address] [phone]        The results of significant diagnostics from this hospitalization (including imaging, microbiology, ancillary and laboratory) are listed below for reference.    Significant Diagnostic Studies: Dg Chest 2 View  12/04/2014   CLINICAL DATA:  64 year old male with shortness of breath since yesterday. Initial encounter. Current history of chronic kidney disease on  dialysis.  EXAM: CHEST  2 VIEW  COMPARISON:  CT Abdomen and Pelvis 07/20/2014 chest radiographs 05/06/2014 and earlier.  FINDINGS: Stable cardiomegaly. No pneumothorax or pulmonary edema. No pleural effusion or acute pulmonary opacity identified. Tortuous thoracic aorta, mediastinal contours are stable. Visualized tracheal air column is within normal limits. No acute osseous abnormality identified.  IMPRESSION: Stable cardiomegaly. No acute cardiopulmonary abnormality.   Electronically Signed   By: Genevie Ann M.D.   On: 12/04/2014 09:52   Ct Angio  Chest Pe W/cm &/or Wo Cm  12/04/2014   CLINICAL DATA:  64 year old male with shortness of breath nausea vomiting and diarrhea since dialysis yesterday. Initial encounter.  EXAM: CT ANGIOGRAPHY CHEST WITH CONTRAST  TECHNIQUE: Multidetector CT imaging of the chest was performed using the standard protocol during bolus administration of intravenous contrast. Multiplanar CT image reconstructions and MIPs were obtained to evaluate the vascular anatomy.  CONTRAST:  174mL OMNIPAQUE IOHEXOL 350 MG/ML SOLN  COMPARISON:  CT Abdomen and Pelvis 07/20/2014. Chest radiographs 0929 hours today.  FINDINGS: Adequate contrast bolus timing in the pulmonary arterial tree.  No focal filling defect identified in the pulmonary arterial tree to suggest the presence of acute pulmonary embolism.  Chronic cardiomegaly and tortuous thoracic aorta with calcified atherosclerosis. The distal arch and proximal descending segment measure up to 40-42 mm diameter. Coronary artery calcified plaque. No pericardial effusion. No mediastinal or hilar lymphadenopathy.  Thyromegaly greater on the right. No discrete thyroid nodule. No axillary lymphadenopathy.  Major airways are patent. Minor dependent atelectasis on the left. Otherwise the lungs are clear. No pleural effusion.  Visualized liver, spleen, adrenal glands and bowel in the upper abdomen are stable.  No acute osseous abnormality identified. Small bone island in the left lateral fourth rib. Flowing osteophytes in the spine.  Review of the MIP images confirms the above findings.  IMPRESSION: 1. No evidence of acute pulmonary embolus. No acute pulmonary findings. 2. Ectatic thoracic aorta. Distal arch/proximal descending segment up to 40-42 mm diameter. Recommend annual imaging followup by CTA or MRA. This recommendation follows 2010 ACCF/AHA/AATS/ACR/ASA/SCA/SCAI/SIR/STS/SVM Guidelines for the Diagnosis and Management of Patients with Thoracic Aortic Disease. Circulation. 2010; 121: P509-T267 3.  Cardiomegaly.  Calcified coronary artery atherosclerosis.   Electronically Signed   By: Genevie Ann M.D.   On: 12/04/2014 13:53    Microbiology: Recent Results (from the past 240 hour(s))  MRSA PCR Screening     Status: None   Collection Time: 12/04/14  9:10 PM  Result Value Ref Range Status   MRSA by PCR NEGATIVE NEGATIVE Final    Comment:        The GeneXpert MRSA Assay (FDA approved for NASAL specimens only), is one component of a comprehensive MRSA colonization surveillance program. It is not intended to diagnose MRSA infection nor to guide or monitor treatment for MRSA infections.   Clostridium Difficile by PCR     Status: None   Collection Time: 12/05/14  6:08 AM  Result Value Ref Range Status   C difficile by pcr NEGATIVE NEGATIVE Final     Labs: Basic Metabolic Panel:  Recent Labs Lab 12/04/14 0903 12/05/14 0611  NA 135 135  K 4.5 5.6*  CL 96 95*  CO2 27 26  GLUCOSE 93 94  BUN 39* 57*  CREATININE 10.84* 12.62*  CALCIUM 8.9 8.9   Liver Function Tests:  Recent Labs Lab 12/04/14 0903 12/05/14 0611  AST 12 10  ALT 11 11  ALKPHOS 56 67  BILITOT 0.7 0.5  PROT 6.4 6.6  ALBUMIN 3.6 3.6   No results for input(s): LIPASE, AMYLASE in the last 168 hours. No results for input(s): AMMONIA in the last 168 hours. CBC:  Recent Labs Lab 12/04/14 0903 12/05/14 0611  WBC 8.8 6.1  NEUTROABS 7.1  --   HGB 11.4* 11.7*  HCT 35.8* 36.1*  MCV 83.1 81.9  PLT 84* 76*   Cardiac Enzymes:  Recent Labs Lab 12/04/14 0903 12/04/14 1553 12/04/14 2138 12/05/14 0611  TROPONINI 0.21* 0.20* 0.21* 0.22*   BNP: BNP (last 3 results)  Recent Labs  12/04/14 0903  BNP 427.0*    ProBNP (last 3 results) No results for input(s): PROBNP in the last 8760 hours.  CBG: No results for input(s): GLUCAP in the last 168 hours.     Signed:  Piper Hassebrock  [facility] 12/05/2014, 12:32 PM

## 2014-12-09 LAB — GI PATHOGEN PANEL BY PCR, STOOL
C DIFFICILE TOXIN A/B: NOT DETECTED
CRYPTOSPORIDIUM BY PCR: NOT DETECTED
Campylobacter by PCR: NOT DETECTED
E coli (ETEC) LT/ST: NOT DETECTED
E coli (STEC): NOT DETECTED
E coli 0157 by PCR: NOT DETECTED
G lamblia by PCR: NOT DETECTED
NOROVIRUS G1/G2: NOT DETECTED
ROTAVIRUS A BY PCR: NOT DETECTED
SALMONELLA BY PCR: NOT DETECTED
Shigella by PCR: NOT DETECTED

## 2014-12-16 ENCOUNTER — Other Ambulatory Visit: Payer: Self-pay

## 2014-12-16 ENCOUNTER — Telehealth: Payer: Self-pay

## 2014-12-16 ENCOUNTER — Encounter: Payer: Self-pay | Admitting: Vascular Surgery

## 2014-12-16 ENCOUNTER — Ambulatory Visit (INDEPENDENT_AMBULATORY_CARE_PROVIDER_SITE_OTHER): Payer: Medicare HMO | Admitting: Vascular Surgery

## 2014-12-16 VITALS — BP 139/83 | HR 61 | Ht 69.0 in | Wt 199.5 lb

## 2014-12-16 DIAGNOSIS — N186 End stage renal disease: Secondary | ICD-10-CM

## 2014-12-16 NOTE — Progress Notes (Signed)
HISTORY AND PHYSICAL     CC:  Arm swelling Referring Provider:  Iona Beard, MD  HPI: This is a 64 y.o. male who presents today with left arm swelling.  He states that each time at HD, it takes 3-4 times to stick the fistula.  On Saturday after the stick, he did have some swelling at the antecubital space.  He was stuck again on Tuesday, which worsened the swelling.  He is referred today for evaluation.  He states the does have 2 areas of enlargement at the distal lateral aspect.  He states there is a small area they are reluctant to keep sticking at this time as they are afraid it will bleed.  He has not had any fevers.  He states that he has placed an ice pack on his arm with no relief.  He complains of soreness over the hematoma.  On 09/12/10, the pt underwent a right RC AVF by Dr. Oneida Alar.  In October 2012, he developed right arm swelling and underwent a right arm fistulogram with angioplasty of the right cephalic vein and angioplasty of the brachial/axillary vein.   On May 31, 2011, he had a left RC AVF placed and his right RC AVF was ligated due to arm swelling.    In March 2013, he underwent fistulogram of the left RC AVF for poor flow. This revealed the fistula was patent in its midportion.  There were several side branches.  There was normal antecubital anatomy with patent basilic and cephalic vein.  He did undergo side branch ligation.  In August 2013, he underwent a left forearm graft.  He subsequently underwent revision of left forearm loop graft for pseudoaneurysms by Dr. Bridgett Larsson 05/06/14.  The pt states that he also had work done on the fistula by Dr. Augustin Coupe at [facility] in the past couple of months.    He dialyzes on T/T/S.  He is on a beta blocker for HTN.   Past Medical History  Diagnosis Date  . Hypertension     Echo in 10/2006-moderate LVH, normal EF  . Obstructive sleep apnea 2008    2008- uses CPAP  . Obesity   . Gout   . Gastroesophageal reflux disease   . Erectile  dysfunction   . Prostatic hypertrophy, benign   . Allergic rhinitis   . Difficult intubation     Spasms after LMA inserted -12/13/10  . Chronic kidney disease, stage 5, kidney failure      dialysis at [facility] T/TH/Sat; ESRD as of 05/2011; leg cramps; hypokalemia; microcytosis  . Secondary hyperparathyroidism of renal origin   . Anemia   . Dialysis patient   . History of blood transfusion   . Thrombocytopenia   . Elevated troponin     Past Surgical History  Procedure Laterality Date  . Total hip arthroplasty      bilateral  . Portacath placement    . Cholecystectomy    . Finger amputation      right middle  . Colonoscopy  07/12/2011    Procedure: COLONOSCOPY;  Surgeon: Dorothyann Peng, MD;  Location: [facility];  Service: Endoscopy;  Laterality: N/A;  1:00  . Av fistula placement  05-31-11    Right radiocephalic AVF  . 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 Joint replacement      times 3  . Av fistula placement  04/01/2012    Procedure: INSERTION OF ARTERIOVENOUS (AV) GORE-TEX  GRAFT ARM;  Surgeon: Elam Dutch, MD;  Location: [place];  Service: Vascular;  Laterality: Left;  . Revison of arteriovenous fistula Left 05/06/2014    Procedure: REVISON OF ARTERIOVENOUS GRAFT;  Surgeon: Conrad Saddlebrooke, MD;  Location: [place];  Service: Vascular;  Laterality: Left;  . Shuntogram N/A 10/06/2011    Procedure: Earney Mallet;  Surgeon: Elam Dutch, MD;  Location: [facility] CATH LAB;  Service: Cardiovascular;  Laterality: N/A;    Allergies  Allergen Reactions  . Penicillins Itching and Rash    REACTION: Rash and itching    Current Outpatient Prescriptions  Medication Sig Dispense Refill  . allopurinol (ZYLOPRIM) 100 MG tablet Take 100 mg by mouth every evening.     Marland Kitchen amLODipine (NORVASC) 10 MG tablet Take 10 mg by mouth every morning.     Marland Kitchen b complex-vitamin c-folic acid (NEPHRO-VITE) 0.8 MG TABS Take 0.8 mg by mouth every morning.     .  benzonatate (TESSALON) 100 MG capsule Take 1 capsule (100 mg total) by mouth 3 (three) times daily as needed for cough. 20 capsule 0  . calcitRIOL (ROCALTROL) 0.5 MCG capsule Take 1 capsule by mouth daily.    . calcium carbonate (TUMS - DOSED IN MG ELEMENTAL CALCIUM) 500 MG chewable tablet Chew 1-4 tablets by mouth 3 (three) times daily with meals. Take 4 tablets with largest meal of the day and 1-2 with smaller meals and snacks    . cloNIDine (CATAPRES) 0.3 MG tablet Take 0.3 mg by mouth 2 (two) times daily.    . metoprolol (LOPRESSOR) 50 MG tablet Take 50 mg by mouth 2 (two) times daily.    Marland Kitchen triamcinolone cream (KENALOG) 0.1 % Apply 1 application topically daily as needed (for itching).     Marland Kitchen HYDROcodone-acetaminophen (NORCO) 5-325 MG per tablet Take 1-2 tablets by mouth every 4 (four) hours as needed (for pain). (Patient not taking: Reported on 12/16/2014) 10 tablet 0   No current facility-administered medications for this visit.    Family History  Problem Relation Age of Onset  . Lung cancer Father 52    hepatic metastases  . Heart attack Mother 105  . Cancer Brother     unk    History   Social History  . Marital Status: Divorced    Spouse Name: N/A  . Number of Children: 3  . Years of Education: N/A   Occupational History  . Retired     [employer]   Social History Main Topics  . Smoking status: Former Smoker -- 1.00 packs/day for 10 years    Types: Cigarettes    Quit date: 01/11/1975  . Smokeless tobacco: Never Used  . Alcohol Use: No  . Drug Use: No  . Sexual Activity: Not on file   Other Topics Concern  . Not on file   Social History Narrative   Lives alone     ROS: [x]  Positive   [ ]  Negative   [ ]  All sytems reviewed and are negative  Cardiovascular: []  chest pain/pressure []  palpitations []  SOB lying flat []  DOE []  pain in legs while walking []  pain in feet when lying flat []  hx of DVT []  hx of phlebitis []  swelling in legs []  varicose  veins  Pulmonary: []  productive cough []  asthma []  wheezing  Neurologic: []  weakness in []  arms []  legs []  numbness in []  arms []  legs [] difficulty speaking or slurred speech []  temporary loss of vision in one eye []  dizziness  Hematologic: []   bleeding problems []  problems with blood clotting easily  GI []  vomiting blood []  blood in stool  GU: []  burning with urination []  blood in urine [x]  ESRD dialysis on T/T/S  Psychiatric: []  hx of major depression  Integumentary: []  rashes []  ulcers  Constitutional: []  fever []  chills   PHYSICAL EXAMINATION:  Filed Vitals:   12/16/14 1322  BP: 139/83  Pulse: 61   Body mass index is 29.45 kg/(m^2).  General:  WDWN in NAD Gait: Not observed HENT: WNL, normocephalic Pulmonary: normal non-labored breathing , without Rales, rhonchi,  wheezing Cardiac: RRR, without  Murmurs, rubs or gallops; without carotid bruits Abdomen: soft, NT, no masses Skin: without rashes, without ulcers  Vascular Exam/Pulses:  Right Left  Radial 2+ (normal) 2+ (normal)   Extremities: without ischemic changes, without Gangrene , without cellulitis; without open wounds; large hematoma over the proximal portion of the left FA graft; he does have 2 pseudoaneurysms distal to the hematoma.  The medial segment of the graft is pulsatile with a + bruit.   Musculoskeletal: no muscle wasting or atrophy  Neurologic: A&O X 3; Appropriate Affect ; SENSATION: normal; MOTOR FUNCTION:  moving all extremities equally. Speech is fluent/normal   Non-Invasive Vascular Imaging:   none  Pt meds includes: Statin:  No. Beta Blocker:  Yes.   Aspirin:  No. ACEI:  No. ARB:  No. Other Antiplatelet/Anticoagulant:  No.    ASSESSMENT/PLAN:: 64 y.o. male s/p left forearm graft with subsequent revision and additional work at [facility] in the past couple of months now with large hematoma at the proximal portion of the graft and two pseudoaneurysms distal to the  hematoma.  -given they are having difficulty sticking the graft (3-4x each session) and now with large hematoma, will plan to place diatek catheter and washout of hematoma left forearm on Friday 12/18/14 as pt dialyzes T/T/S. -reluctant to place new graft at time of washout of hematoma b/c of possible risk of infection.  Will allow this to heal and further evaluate fistula at that time.  Will need to use medial aspect of fistula for dialysis until catheter can be placed.  -if after this heals and they are still having difficulty sticking the fistula, he may need a fistulogram or a new left upper arm graft.     Leontine Locket, PA-C [facility] [phone]  Clinic MD:  Pt seen and examined in conjunction with Dr. Scot Dock  Agree with assessment above.  [name], MD, [place] [phone] Office: [phone]

## 2014-12-16 NOTE — Telephone Encounter (Signed)
rec'd call from [facility]. with referral for appt. today.  Reported pt. presented to the kidney center with c/o increased swelling, redness, firmness, and pain in left FA AVG site.  Denied fever/ chills.  Per Abigail Butts, at the kidney center, Dr. Florene Glen wants pt. seen in office today, or to be sent to the ER.  Discussed with Dr. Scot Dock.  Recommended to bring pt. to office early this afternoon for evaluation.

## 2014-12-17 ENCOUNTER — Encounter (HOSPITAL_COMMUNITY): Payer: Self-pay | Admitting: *Deleted

## 2014-12-17 MED ORDER — SODIUM CHLORIDE 0.9 % IV SOLN
INTRAVENOUS | Status: DC
  Administered 2014-12-18: 08:00:00 via INTRAVENOUS

## 2014-12-17 MED ORDER — VANCOMYCIN HCL IN DEXTROSE 1-5 GM/200ML-% IV SOLN
1000.0000 mg | INTRAVENOUS | Status: AC
  Administered 2014-12-18: 1000 mg via INTRAVENOUS
  Filled 2014-12-17: qty 200

## 2014-12-17 MED ORDER — CHLORHEXIDINE GLUCONATE CLOTH 2 % EX PADS: 6.0000 | MEDICATED_PAD | Freq: Once | CUTANEOUS | Status: DC

## 2014-12-18 ENCOUNTER — Telehealth: Payer: Self-pay | Admitting: Vascular Surgery

## 2014-12-18 ENCOUNTER — Encounter (HOSPITAL_COMMUNITY): Payer: Self-pay | Admitting: *Deleted

## 2014-12-18 ENCOUNTER — Ambulatory Visit (HOSPITAL_COMMUNITY): Payer: Medicare HMO | Admitting: Certified Registered"

## 2014-12-18 ENCOUNTER — Ambulatory Visit (HOSPITAL_COMMUNITY)
Admission: RE | Admit: 2014-12-18 | Discharge: 2014-12-18 | Disposition: A | Discharge status: Home / Self Care | Payer: Medicare HMO | Source: Ambulatory Visit | Attending: Vascular Surgery | Admitting: Vascular Surgery

## 2014-12-18 ENCOUNTER — Ambulatory Visit (HOSPITAL_COMMUNITY): Payer: Medicare HMO

## 2014-12-18 ENCOUNTER — Encounter (HOSPITAL_COMMUNITY)
Admission: RE | Disposition: A | Discharge status: Home / Self Care | Payer: Self-pay | Source: Ambulatory Visit | Attending: Vascular Surgery

## 2014-12-18 DIAGNOSIS — Z992 Dependence on renal dialysis: Secondary | ICD-10-CM | POA: Diagnosis not present

## 2014-12-18 DIAGNOSIS — I129 Hypertensive chronic kidney disease with stage 1 through stage 4 chronic kidney disease, or unspecified chronic kidney disease: Secondary | ICD-10-CM | POA: Insufficient documentation

## 2014-12-18 DIAGNOSIS — I898 Other specified noninfective disorders of lymphatic vessels and lymph nodes: Secondary | ICD-10-CM | POA: Diagnosis not present

## 2014-12-18 DIAGNOSIS — Z96643 Presence of artificial hip joint, bilateral: Secondary | ICD-10-CM | POA: Insufficient documentation

## 2014-12-18 DIAGNOSIS — Z09 Encounter for follow-up examination after completed treatment for conditions other than malignant neoplasm: Secondary | ICD-10-CM

## 2014-12-18 DIAGNOSIS — Z87891 Personal history of nicotine dependence: Secondary | ICD-10-CM | POA: Diagnosis not present

## 2014-12-18 DIAGNOSIS — N400 Enlarged prostate without lower urinary tract symptoms: Secondary | ICD-10-CM | POA: Diagnosis not present

## 2014-12-18 DIAGNOSIS — T82898A Other specified complication of vascular prosthetic devices, implants and grafts, initial encounter: Secondary | ICD-10-CM | POA: Insufficient documentation

## 2014-12-18 DIAGNOSIS — N2581 Secondary hyperparathyroidism of renal origin: Secondary | ICD-10-CM | POA: Diagnosis not present

## 2014-12-18 DIAGNOSIS — G4733 Obstructive sleep apnea (adult) (pediatric): Secondary | ICD-10-CM | POA: Insufficient documentation

## 2014-12-18 DIAGNOSIS — N185 Chronic kidney disease, stage 5: Secondary | ICD-10-CM | POA: Insufficient documentation

## 2014-12-18 DIAGNOSIS — Z419 Encounter for procedure for purposes other than remedying health state, unspecified: Secondary | ICD-10-CM

## 2014-12-18 HISTORY — PX: AV FISTULA PLACEMENT: SHX1204

## 2014-12-18 HISTORY — PX: INSERTION OF DIALYSIS CATHETER: SHX1324

## 2014-12-18 HISTORY — PX: HEMATOMA EVACUATION: SHX5118

## 2014-12-18 LAB — POCT I-STAT 4, (NA,K, GLUC, HGB,HCT)
GLUCOSE: 88 mg/dL (ref 65–99)
HEMATOCRIT: 34 % — AB (ref 39.0–52.0)
Hemoglobin: 11.6 g/dL — ABNORMAL LOW (ref 13.0–17.0)
Potassium: 4.2 mmol/L (ref 3.5–5.1)
Sodium: 134 mmol/L — ABNORMAL LOW (ref 135–145)

## 2014-12-18 SURGERY — INSERTION OF DIALYSIS CATHETER
Anesthesia: Monitor Anesthesia Care | Site: Neck | Laterality: Right

## 2014-12-18 MED ORDER — FENTANYL CITRATE (PF) 250 MCG/5ML IJ SOLN
INTRAMUSCULAR | Status: AC
  Filled 2014-12-18: qty 5

## 2014-12-18 MED ORDER — HEPARIN SODIUM (PORCINE) 1000 UNIT/ML IJ SOLN
INTRAMUSCULAR | Status: DC | PRN
  Administered 2014-12-18: 8000 [IU] via INTRAVENOUS

## 2014-12-18 MED ORDER — SODIUM CHLORIDE 0.9 % IR SOLN
Status: DC | PRN
  Administered 2014-12-18: 1000 mL

## 2014-12-18 MED ORDER — THROMBIN 20000 UNITS EX SOLR
CUTANEOUS | Status: AC
  Filled 2014-12-18: qty 20000

## 2014-12-18 MED ORDER — HYDROCODONE-ACETAMINOPHEN 5-325 MG PO TABS
ORAL_TABLET | ORAL | Status: AC
  Filled 2014-12-18: qty 2

## 2014-12-18 MED ORDER — MIDAZOLAM HCL 2 MG/2ML IJ SOLN
INTRAMUSCULAR | Status: AC
  Filled 2014-12-18: qty 2

## 2014-12-18 MED ORDER — HYDROCODONE-ACETAMINOPHEN 5-325 MG PO TABS: 1.0000 | ORAL_TABLET | ORAL | Status: DC | PRN

## 2014-12-18 MED ORDER — PROPOFOL 10 MG/ML IV BOLUS
INTRAVENOUS | Status: AC
  Filled 2014-12-18: qty 20

## 2014-12-18 MED ORDER — PROPOFOL 10 MG/ML IV BOLUS
INTRAVENOUS | Status: DC | PRN
  Administered 2014-12-18: 20 mg via INTRAVENOUS
  Administered 2014-12-18: 30 mg via INTRAVENOUS

## 2014-12-18 MED ORDER — SODIUM CHLORIDE 0.9 % IR SOLN
Status: DC | PRN
  Administered 2014-12-18: 500 mL

## 2014-12-18 MED ORDER — MIDAZOLAM HCL 5 MG/5ML IJ SOLN
INTRAMUSCULAR | Status: DC | PRN
Start: 2014-12-18 — End: 2014-12-18
  Administered 2014-12-18 (×2): 1 mg via INTRAVENOUS

## 2014-12-18 MED ORDER — PROTAMINE SULFATE 10 MG/ML IV SOLN
INTRAVENOUS | Status: AC
  Filled 2014-12-18: qty 5

## 2014-12-18 MED ORDER — LIDOCAINE-EPINEPHRINE (PF) 1 %-1:200000 IJ SOLN
INTRAMUSCULAR | Status: AC
  Filled 2014-12-18: qty 10

## 2014-12-18 MED ORDER — HYDROCODONE-ACETAMINOPHEN 5-325 MG PO TABS
2.0000 | ORAL_TABLET | Freq: Once | ORAL | Status: AC
  Administered 2014-12-18: 2 via ORAL

## 2014-12-18 MED ORDER — PROTAMINE SULFATE 10 MG/ML IV SOLN
INTRAVENOUS | Status: DC | PRN
  Administered 2014-12-18: 10 mg via INTRAVENOUS
  Administered 2014-12-18: 20 mg via INTRAVENOUS
  Administered 2014-12-18: 10 mg via INTRAVENOUS

## 2014-12-18 MED ORDER — PROPOFOL INFUSION 10 MG/ML OPTIME
INTRAVENOUS | Status: DC | PRN
  Administered 2014-12-18: 50 ug/kg/min via INTRAVENOUS

## 2014-12-18 MED ORDER — HEPARIN SODIUM (PORCINE) 1000 UNIT/ML IJ SOLN
INTRAMUSCULAR | Status: AC
  Filled 2014-12-18: qty 1

## 2014-12-18 MED ORDER — HEPARIN SODIUM (PORCINE) 1000 UNIT/ML IJ SOLN
INTRAMUSCULAR | Status: DC | PRN
  Administered 2014-12-18: 4.6 mL

## 2014-12-18 MED ORDER — FENTANYL CITRATE (PF) 100 MCG/2ML IJ SOLN: 25.0000 ug | INTRAMUSCULAR | Status: DC | PRN

## 2014-12-18 MED ORDER — FENTANYL CITRATE (PF) 100 MCG/2ML IJ SOLN
INTRAMUSCULAR | Status: DC | PRN
  Administered 2014-12-18: 50 ug via INTRAVENOUS

## 2014-12-18 MED ORDER — LIDOCAINE-EPINEPHRINE (PF) 1 %-1:200000 IJ SOLN
INTRAMUSCULAR | Status: DC | PRN
Start: 2014-12-18 — End: 2014-12-18
  Administered 2014-12-18 (×2): 30 mL

## 2014-12-18 SURGICAL SUPPLY — 72 items
BAG DECANTER FOR FLEXI CONT (MISCELLANEOUS) ×3 IMPLANT
BANDAGE ELASTIC 4 VELCRO ST LF (GAUZE/BANDAGES/DRESSINGS) IMPLANT
BANDAGE ESMARK 6X9 LF (GAUZE/BANDAGES/DRESSINGS) IMPLANT
BIOPATCH RED 1 DISK 7.0 (GAUZE/BANDAGES/DRESSINGS) ×3 IMPLANT
BNDG CMPR 9X6 STRL LF SNTH (GAUZE/BANDAGES/DRESSINGS) ×2
BNDG ESMARK 6X9 LF (GAUZE/BANDAGES/DRESSINGS) ×3
CANISTER SUCTION 2500CC (MISCELLANEOUS) ×3 IMPLANT
CATH CANNON HEMO 15F 50CM (CATHETERS) IMPLANT
CATH CANNON HEMO 15FR 19 (HEMODIALYSIS SUPPLIES) IMPLANT
CATH CANNON HEMO 15FR 23CM (HEMODIALYSIS SUPPLIES) ×1 IMPLANT
CATH CANNON HEMO 15FR 31CM (HEMODIALYSIS SUPPLIES) IMPLANT
CATH CANNON HEMO 15FR 32 (HEMODIALYSIS SUPPLIES) IMPLANT
CATH CANNON HEMO 15FR 32CM (HEMODIALYSIS SUPPLIES) IMPLANT
CHLORAPREP W/TINT 26ML (MISCELLANEOUS) ×3 IMPLANT
COVER PROBE W GEL 5X96 (DRAPES) ×1 IMPLANT
COVER SURGICAL LIGHT HANDLE (MISCELLANEOUS) ×1 IMPLANT
CUFF TOURNIQUET SINGLE 18IN (TOURNIQUET CUFF) ×1 IMPLANT
CUFF TOURNIQUET SINGLE 24IN (TOURNIQUET CUFF) IMPLANT
CUFF TOURNIQUET SINGLE 34IN LL (TOURNIQUET CUFF) IMPLANT
CUFF TOURNIQUET SINGLE 44IN (TOURNIQUET CUFF) IMPLANT
DRAIN CHANNEL 15F RND FF W/TCR (WOUND CARE) IMPLANT
DRAPE C-ARM 42X72 X-RAY (DRAPES) ×3 IMPLANT
DRAPE CHEST BREAST 15X10 FENES (DRAPES) ×3 IMPLANT
DRSG COVADERM 4X10 (GAUZE/BANDAGES/DRESSINGS) IMPLANT
DRSG COVADERM 4X8 (GAUZE/BANDAGES/DRESSINGS) IMPLANT
ELECT REM PT RETURN 9FT ADLT (ELECTROSURGICAL) ×3
ELECTRODE REM PT RTRN 9FT ADLT (ELECTROSURGICAL) ×2 IMPLANT
EVACUATOR SILICONE 100CC (DRAIN) IMPLANT
GAUZE SPONGE 2X2 8PLY STRL LF (GAUZE/BANDAGES/DRESSINGS) ×2 IMPLANT
GAUZE SPONGE 4X4 16PLY XRAY LF (GAUZE/BANDAGES/DRESSINGS) ×3 IMPLANT
GLOVE BIO SURGEON STRL SZ7.5 (GLOVE) ×5 IMPLANT
GLOVE BIOGEL PI IND STRL 7.0 (GLOVE) IMPLANT
GLOVE BIOGEL PI IND STRL 8 (GLOVE) ×2 IMPLANT
GLOVE BIOGEL PI INDICATOR 7.0 (GLOVE) ×2
GLOVE BIOGEL PI INDICATOR 8 (GLOVE) ×2
GLOVE SURG SS PI 7.0 STRL IVOR (GLOVE) ×3 IMPLANT
GOWN STRL REUS W/ TWL LRG LVL3 (GOWN DISPOSABLE) ×6 IMPLANT
GOWN STRL REUS W/TWL LRG LVL3 (GOWN DISPOSABLE) ×18
GRAFT GORETEX STND 4X7 (Vascular Products) ×3 IMPLANT
GRAFT GORETEXSTD 4X7 (Vascular Products) IMPLANT
KIT BASIN OR (CUSTOM PROCEDURE TRAY) ×3 IMPLANT
KIT ROOM TURNOVER OR (KITS) ×3 IMPLANT
LIQUID BAND (GAUZE/BANDAGES/DRESSINGS) ×2 IMPLANT
NDL 18GX1X1/2 (RX/OR ONLY) (NEEDLE) ×2 IMPLANT
NDL HYPO 25GX1X1/2 BEV (NEEDLE) ×2 IMPLANT
NEEDLE 18GX1X1/2 (RX/OR ONLY) (NEEDLE) ×3 IMPLANT
NEEDLE 22X1 1/2 (OR ONLY) (NEEDLE) ×2 IMPLANT
NEEDLE HYPO 25GX1X1/2 BEV (NEEDLE) ×3 IMPLANT
NS IRRIG 1000ML POUR BTL (IV SOLUTION) ×5 IMPLANT
PACK PERIPHERAL VASCULAR (CUSTOM PROCEDURE TRAY) ×3 IMPLANT
PACK SURGICAL SETUP 50X90 (CUSTOM PROCEDURE TRAY) ×2 IMPLANT
PAD ARMBOARD 7.5X6 YLW CONV (MISCELLANEOUS) ×6 IMPLANT
PADDING CAST COTTON 6X4 STRL (CAST SUPPLIES) IMPLANT
SPONGE GAUZE 2X2 STER 10/PKG (GAUZE/BANDAGES/DRESSINGS)
SPONGE GAUZE 4X4 12PLY STER LF (GAUZE/BANDAGES/DRESSINGS) ×1 IMPLANT
STAPLER VISISTAT (STAPLE) IMPLANT
SUT ETHILON 3 0 PS 1 (SUTURE) ×3 IMPLANT
SUT PROLENE 5 0 C 1 24 (SUTURE) IMPLANT
SUT PROLENE 6 0 BV (SUTURE) ×4 IMPLANT
SUT VIC AB 2-0 CTB1 (SUTURE) ×2 IMPLANT
SUT VIC AB 3-0 SH 27 (SUTURE) ×3
SUT VIC AB 3-0 SH 27X BRD (SUTURE) ×2 IMPLANT
SUT VICRYL 4-0 PS2 18IN ABS (SUTURE) ×6 IMPLANT
SWAB COLLECTION DEVICE MRSA (MISCELLANEOUS) ×1 IMPLANT
SYR 20CC LL (SYRINGE) ×6 IMPLANT
SYR 5ML LL (SYRINGE) ×6 IMPLANT
SYR CONTROL 10ML LL (SYRINGE) ×3 IMPLANT
SYRINGE 10CC LL (SYRINGE) ×3 IMPLANT
TAPE CLOTH SURG 4X10 WHT LF (GAUZE/BANDAGES/DRESSINGS) ×1 IMPLANT
TRAY FOLEY CATH 16FRSI W/METER (SET/KITS/TRAYS/PACK) ×2 IMPLANT
UNDERPAD 30X30 INCONTINENT (UNDERPADS AND DIAPERS) ×3 IMPLANT
WATER STERILE IRR 1000ML POUR (IV SOLUTION) ×3 IMPLANT

## 2014-12-18 NOTE — Anesthesia Preprocedure Evaluation (Addendum)
Anesthesia Evaluation  Patient identified by MRN, date of birth, ID band Patient awake    Airway Mallampati: II  TM Distance: >3 FB Neck ROM: Full    Dental  (+) Teeth Intact, Chipped,    Pulmonary shortness of breath, sleep apnea , former smoker,  breath sounds clear to auscultation        Cardiovascular hypertension, Rhythm:Regular Rate:Normal     Neuro/Psych    GI/Hepatic GERD-  ,  Endo/Other    Renal/GU Renal disease     Musculoskeletal   Abdominal   Peds  Hematology   Anesthesia Other Findings   Reproductive/Obstetrics                           Anesthesia Physical Anesthesia Plan  ASA: III  Anesthesia Plan: MAC   Post-op Pain Management:    Induction: Intravenous  Airway Management Planned: Simple Face Mask  Additional Equipment:   Intra-op Plan:   Post-operative Plan:   Informed Consent: I have reviewed the patients History and Physical, chart, labs and discussed the procedure including the risks, benefits and alternatives for the proposed anesthesia with the patient or authorized representative who has indicated his/her understanding and acceptance.   Dental advisory given  Plan Discussed with: CRNA, Anesthesiologist and Surgeon  Anesthesia Plan Comments:         Anesthesia Quick Evaluation

## 2014-12-18 NOTE — Op Note (Signed)
NAME: Matthew Kane   MRN: [id number] DOB: 04/14/1951    DATE OF OPERATION: 12/18/2014  PREOP DIAGNOSIS: hematoma adjacent to left forearm AV graft  POSTOP DIAGNOSIS: lymphocele left AV graft  PROCEDURE:  1. Ultrasound-guided access to right internal jugular vein and placement of 23 cm tunneled dialysis catheter 2. Evacuation of lymphocele left forearm AV graft 3. Revision of left forearm AV graft with interposition 7 mm PTFE graft  SURGEON: Judeth Cornfield. Scot Dock, MD, FACS  ASSIST: Leontine Locket, PA  ANESTHESIA: local with sedation   EBL: minimal  INDICATIONS: Matthew Kane is a 64 y.o. male who reportedly developed sudden swelling overlying the proximal aspect of his AV graft just above the antecubital level. I felt that this was most likely related to bleeding given that this occurred during dialysis suddenly.  I did not think the graft to be cannulated and there were 2 other large pseudoaneurysms along the lateral aspect of the graft. I therefore recommended placement of a tunneled dialysis catheter and evacuation of the hematoma.  FINDINGS: the swelling was actually a large lymphocele which I evacuated. There was one bleeding segment of the graft which was repaired with a 6-0 Prolene. However there was no hematoma. I replaced the lateral half of the graft and this can be used for dialysis in 1 month. The medial half of the graft can be used in the interim. I have recommended using the Diatek catheter for 1 week before beginning to use the medial aspect of the graft. Once if this is working well and if it is functioning adequately the catheter can be removed.  TECHNIQUE:  The patient was taken to the operating room and sedated by anesthesia. The neck and upper chest were prepped and draped in the usual sterile fashion. Under ultrasound guidance, after the skin was anesthetized, the right IJ was cannulated and a guidewire introduced into the superior vena cava under fluoroscopic  control. The tract over the wire was dilated and then a dilator and peel-away sheath were advanced over the wire and the wire and dilator removed. A 23 cm catheter was passed through the peel-away sheath and positioned in the superior vena cava. The exit site for the catheter was selected and the skin anesthetized between the 2 areas. The catheter was brought to the tunnel, cut the appropriate length, and the distal ports were attached. Both ports withdrew easily when flushed with heparin saline and filled with concentrated heparin. The catheter was secured at its exit site with a 3-0 nylon suture. The IJ cannulation site was closed with a 4-0 subcuticular stitch. Sterile dressing was applied.  Attention was then turned to the left arm. The left arm was prepped and draped in usual sterile fashion. The patient stated that the swelling had gotten worse and I was worried about continued bleeding. I therefore placed a tourniquet on the upper arm. After the skin was anesthetized and elliptical incision was made over this large mass and dissection carried down to the mass. This appeared to be chronic and did not appear to be an acute hematoma. I entered the mass and this was a large lymphocele which I evacuated. There was one small segment of the bladder graft that was bleeding after I evacuated the lymphocele have her there is no hematoma to suggest that the swelling is related to bleeding. There was no signs of infection and therefore elected to replace the lateral half of the graft as there were 2 other large lymphoceles  which prohibited use. Using 1 distal counter incision, after the skin was anesthetized, the distal loop of the graft was dissected free. A tunnel was created between the 2 incisions. A 7 mm graft was brought between the 2 incisions and the patient heparinized.  At the distal loop of the graft the old graft was divided and the new graft sewn end to end to the old graft using continuous 6-0 Prolene  suture. The grafts and pulled the appropriate length for anastomosis to the graft below the antecubital level. Again the old graft was cut the appropriate length and the new graft cut to the appropriate length and sewn end to end with continuous 6-0 Prolene suture. At the completion was good thrill in the graft. The heparin was partially reversed with protamine. Each of the wounds was closed with the 3-0 Vicryl and the skin closed with 4-0 Vicryl. The patient tolerated the procedure well and was transferred to the recovery room in stable condition. All needle and sponge counts were correct.  Matthew Mayo, MD, FACS [facility]  DATE OF DICTATION:   12/18/2014

## 2014-12-18 NOTE — Interval H&P Note (Signed)
History and Physical Interval Note:  12/18/2014 7:27 AM  Matthew Kane  has presented today for surgery, with the diagnosis of End Stage Renal Disease N18.6; Left forearm arteriovenous graft hematoma T82.898A  The various methods of treatment have been discussed with the patient and family. After consideration of risks, benefits and other options for treatment, the patient has consented to  Procedure(s): INSERTION OF DIALYSIS CATHETER (N/A) EVACUATION HEMATOMA (Left) as a surgical intervention .  The patient's history has been reviewed, patient examined, no change in status, stable for surgery.  I have reviewed the patient's chart and labs.  Questions were answered to the patient's satisfaction.     [name]

## 2014-12-18 NOTE — Anesthesia Procedure Notes (Signed)
Procedure Name: MAC Date/Time: 12/18/2014 8:10 AM Performed by: Barrington Ellison Pre-anesthesia Checklist: Emergency Drugs available, Suction available, Patient being monitored, Timeout performed and Patient identified Patient Re-evaluated:Patient Re-evaluated prior to inductionOxygen Delivery Method: Nasal cannula

## 2014-12-18 NOTE — Transfer of Care (Signed)
Immediate Anesthesia Transfer of Care Note  Patient: Matthew Kane  Procedure(s) Performed: Procedure(s): ULTRASOUND GUIDED INSERTION OF RIGHT INTERNAL JUGULAR DIALYSIS CATHETER (Right) EVACUATION OF LEFT ARM LYMPHOCELE (Left) REVISION OF LEFT ARM ARTERIOVENOUS (AV) GORE-TEX GRAFT WITH INTERPOSITION OF 76mm GRAFT (Left)  Patient Location: PACU  Anesthesia Type:MAC  Level of Consciousness: lethargic and responds to stimulation  Airway & Oxygen Therapy: Patient Spontanous Breathing and Patient connected to nasal cannula oxygen  Post-op Assessment: Report given to RN  Post vital signs: Reviewed and stable  Last Vitals:  Filed Vitals:   12/18/14 0741  BP: 145/85  Pulse: 56  Temp: 36.6 C  Resp: 18    Complications: No apparent anesthesia complications

## 2014-12-18 NOTE — H&P (View-Only) (Signed)
HISTORY AND PHYSICAL     CC:  Arm swelling Referring Provider:  Iona Beard, MD  HPI: This is a 64 y.o. male who presents today with left arm swelling.  He states that each time at HD, it takes 3-4 times to stick the fistula.  On Saturday after the stick, he did have some swelling at the antecubital space.  He was stuck again on Tuesday, which worsened the swelling.  He is referred today for evaluation.  He states the does have 2 areas of enlargement at the distal lateral aspect.  He states there is a small area they are reluctant to keep sticking at this time as they are afraid it will bleed.  He has not had any fevers.  He states that he has placed an ice pack on his arm with no relief.  He complains of soreness over the hematoma.  On 09/12/10, the pt underwent a right RC AVF by Dr. Oneida Alar.  In October 2012, he developed right arm swelling and underwent a right arm fistulogram with angioplasty of the right cephalic vein and angioplasty of the brachial/axillary vein.   On May 31, 2011, he had a left RC AVF placed and his right RC AVF was ligated due to arm swelling.    In March 2013, he underwent fistulogram of the left RC AVF for poor flow. This revealed the fistula was patent in its midportion.  There were several side branches.  There was normal antecubital anatomy with patent basilic and cephalic vein.  He did undergo side branch ligation.  In August 2013, he underwent a left forearm graft.  He subsequently underwent revision of left forearm loop graft for pseudoaneurysms by Dr. Bridgett Larsson 05/06/14.  The pt states that he also had work done on the fistula by Dr. Augustin Coupe at [facility] in the past couple of months.    He dialyzes on T/T/S.  He is on a beta blocker for HTN.   Past Medical History  Diagnosis Date  . Hypertension     Echo in 10/2006-moderate LVH, normal EF  . Obstructive sleep apnea 2008    2008- uses CPAP  . Obesity   . Gout   . Gastroesophageal reflux disease   . Erectile  dysfunction   . Prostatic hypertrophy, benign   . Allergic rhinitis   . Difficult intubation     Spasms after LMA inserted -12/13/10  . Chronic kidney disease, stage 5, kidney failure      dialysis at [facility] T/TH/Sat; ESRD as of 05/2011; leg cramps; hypokalemia; microcytosis  . Secondary hyperparathyroidism of renal origin   . Anemia   . Dialysis patient   . History of blood transfusion   . Thrombocytopenia   . Elevated troponin     Past Surgical History  Procedure Laterality Date  . Total hip arthroplasty      bilateral  . Portacath placement    . Cholecystectomy    . Finger amputation      right middle  . Colonoscopy  07/12/2011    Procedure: COLONOSCOPY;  Surgeon: Dorothyann Peng, MD;  Location: [facility];  Service: Endoscopy;  Laterality: N/A;  1:00  . Av fistula placement  05-31-11    Right radiocephalic AVF  . 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 Joint replacement      times 3  . Av fistula placement  04/01/2012    Procedure: INSERTION OF ARTERIOVENOUS (AV) GORE-TEX  GRAFT ARM;  Surgeon: Elam Dutch, MD;  Location: [facility];  Service: Vascular;  Laterality: Left;  . Revison of arteriovenous fistula Left 05/06/2014    Procedure: REVISON OF ARTERIOVENOUS GRAFT;  Surgeon: Conrad Dighton, MD;  Location: [facility];  Service: Vascular;  Laterality: Left;  . Shuntogram N/A 10/06/2011    Procedure: Earney Mallet;  Surgeon: Elam Dutch, MD;  Location: [facility] CATH LAB;  Service: Cardiovascular;  Laterality: N/A;    Allergies  Allergen Reactions  . Penicillins Itching and Rash    REACTION: Rash and itching    Current Outpatient Prescriptions  Medication Sig Dispense Refill  . allopurinol (ZYLOPRIM) 100 MG tablet Take 100 mg by mouth every evening.     Marland Kitchen amLODipine (NORVASC) 10 MG tablet Take 10 mg by mouth every morning.     Marland Kitchen b complex-vitamin c-folic acid (NEPHRO-VITE) 0.8 MG TABS Take 0.8 mg by mouth every morning.     .  benzonatate (TESSALON) 100 MG capsule Take 1 capsule (100 mg total) by mouth 3 (three) times daily as needed for cough. 20 capsule 0  . calcitRIOL (ROCALTROL) 0.5 MCG capsule Take 1 capsule by mouth daily.    . calcium carbonate (TUMS - DOSED IN MG ELEMENTAL CALCIUM) 500 MG chewable tablet Chew 1-4 tablets by mouth 3 (three) times daily with meals. Take 4 tablets with largest meal of the day and 1-2 with smaller meals and snacks    . cloNIDine (CATAPRES) 0.3 MG tablet Take 0.3 mg by mouth 2 (two) times daily.    . metoprolol (LOPRESSOR) 50 MG tablet Take 50 mg by mouth 2 (two) times daily.    Marland Kitchen triamcinolone cream (KENALOG) 0.1 % Apply 1 application topically daily as needed (for itching).     Marland Kitchen HYDROcodone-acetaminophen (NORCO) 5-325 MG per tablet Take 1-2 tablets by mouth every 4 (four) hours as needed (for pain). (Patient not taking: Reported on 12/16/2014) 10 tablet 0   No current facility-administered medications for this visit.    Family History  Problem Relation Age of Onset  . Lung cancer Father 39    hepatic metastases  . Heart attack Mother 37  . Cancer Brother     unk    History   Social History  . Marital Status: Divorced    Spouse Name: N/A  . Number of Children: 3  . Years of Education: N/A   Occupational History  . Retired     [employer]   Social History Main Topics  . Smoking status: Former Smoker -- 1.00 packs/day for 10 years    Types: Cigarettes    Quit date: 01/11/1975  . Smokeless tobacco: Never Used  . Alcohol Use: No  . Drug Use: No  . Sexual Activity: Not on file   Other Topics Concern  . Not on file   Social History Narrative   Lives alone     ROS: [x]  Positive   [ ]  Negative   [ ]  All sytems reviewed and are negative  Cardiovascular: []  chest pain/pressure []  palpitations []  SOB lying flat []  DOE []  pain in legs while walking []  pain in feet when lying flat []  hx of DVT []  hx of phlebitis []  swelling in legs []  varicose  veins  Pulmonary: []  productive cough []  asthma []  wheezing  Neurologic: []  weakness in []  arms []  legs []  numbness in []  arms []  legs [] difficulty speaking or slurred speech []  temporary loss of vision in one eye []  dizziness  Hematologic: []   bleeding problems []  problems with blood clotting easily  GI []  vomiting blood []  blood in stool  GU: []  burning with urination []  blood in urine [x]  ESRD dialysis on T/T/S  Psychiatric: []  hx of major depression  Integumentary: []  rashes []  ulcers  Constitutional: []  fever []  chills   PHYSICAL EXAMINATION:  Filed Vitals:   12/16/14 1322  BP: 139/83  Pulse: 61   Body mass index is 29.45 kg/(m^2).  General:  WDWN in NAD Gait: Not observed HENT: WNL, normocephalic Pulmonary: normal non-labored breathing , without Rales, rhonchi,  wheezing Cardiac: RRR, without  Murmurs, rubs or gallops; without carotid bruits Abdomen: soft, NT, no masses Skin: without rashes, without ulcers  Vascular Exam/Pulses:  Right Left  Radial 2+ (normal) 2+ (normal)   Extremities: without ischemic changes, without Gangrene , without cellulitis; without open wounds; large hematoma over the proximal portion of the left FA graft; he does have 2 pseudoaneurysms distal to the hematoma.  The medial segment of the graft is pulsatile with a + bruit.   Musculoskeletal: no muscle wasting or atrophy  Neurologic: A&O X 3; Appropriate Affect ; SENSATION: normal; MOTOR FUNCTION:  moving all extremities equally. Speech is fluent/normal   Non-Invasive Vascular Imaging:   none  Pt meds includes: Statin:  No. Beta Blocker:  Yes.   Aspirin:  No. ACEI:  No. ARB:  No. Other Antiplatelet/Anticoagulant:  No.    ASSESSMENT/PLAN:: 64 y.o. male s/p left forearm graft with subsequent revision and additional work at [facility] in the past couple of months now with large hematoma at the proximal portion of the graft and two pseudoaneurysms distal to the  hematoma.  -given they are having difficulty sticking the graft (3-4x each session) and now with large hematoma, will plan to place diatek catheter and washout of hematoma left forearm on Friday 12/18/14 as pt dialyzes T/T/S. -reluctant to place new graft at time of washout of hematoma b/c of possible risk of infection.  Will allow this to heal and further evaluate fistula at that time.  Will need to use medial aspect of fistula for dialysis until catheter can be placed.  -if after this heals and they are still having difficulty sticking the fistula, he may need a fistulogram or a new left upper arm graft.     Leontine Locket, PA-C [facility] [phone]  Clinic MD:  Pt seen and examined in conjunction with Dr. Scot Dock  Agree with assessment above.  [name], MD, [place] [phone] Office: [phone]

## 2014-12-18 NOTE — Telephone Encounter (Signed)
-----   Message from Denman George, RN sent at 12/18/2014  2:45 PM EDT ----- Regarding: Zigmund Daniel log; also needs 2 wk. f/u appt. with [facility] for wound check   ----- Message -----    From: Gabriel Earing, PA-C    Sent: 12/18/2014  10:04 AM      To: [facility] Charge Pool  S/p revision of left arm AVG and insertion of diatek catheter 12/18/14.  F/u with Dr. Scot Dock in 2 weeks for wound check.  Thanks, Aldona Bar

## 2014-12-18 NOTE — Telephone Encounter (Signed)
No vm, mailed letter, dpm

## 2014-12-18 NOTE — Discharge Instructions (Signed)
° ° °  12/18/2014 Matthew Kane [id number] 12/04/1950  Surgeon(s): Angelia Mould, MD  Procedure(s): ULTRASOUND GUIDED INSERTION OF RIGHT INTERNAL JUGULAR DIALYSIS CATHETER EVACUATION OF LEFT ARM LYMPHOCELE REVISION OF LEFT ARM ARTERIOVENOUS (AV) GORE-TEX GRAFT WITH INTERPOSITION OF 44mm GRAFT  x Do not stick graft for 4 weeks

## 2014-12-18 NOTE — Anesthesia Postprocedure Evaluation (Signed)
  Anesthesia Post-op Note  Patient: Matthew Kane  Procedure(s) Performed: Procedure(s): ULTRASOUND GUIDED INSERTION OF RIGHT INTERNAL JUGULAR DIALYSIS CATHETER (Right) EVACUATION OF LEFT ARM LYMPHOCELE (Left) REVISION OF LEFT ARM ARTERIOVENOUS (AV) GORE-TEX GRAFT WITH INTERPOSITION OF 80mm GRAFT (Left)  Patient Location: PACU  Anesthesia Type:MAC  Level of Consciousness: awake  Airway and Oxygen Therapy: Patient Spontanous Breathing  Post-op Pain: mild  Post-op Assessment: Post-op Vital signs reviewed  Post-op Vital Signs: Reviewed  Last Vitals:  Filed Vitals:   12/18/14 1115  BP: 136/78  Pulse:   Temp:   Resp: 16    Complications: No apparent anesthesia complications

## 2014-12-20 LAB — WOUND CULTURE

## 2014-12-21 ENCOUNTER — Encounter (HOSPITAL_COMMUNITY): Payer: Self-pay | Admitting: Vascular Surgery

## 2015-01-01 ENCOUNTER — Encounter: Payer: Self-pay | Admitting: Vascular Surgery

## 2015-01-06 ENCOUNTER — Encounter: Payer: Medicare HMO | Admitting: Vascular Surgery

## 2015-01-18 ENCOUNTER — Encounter: Payer: Self-pay | Admitting: Vascular Surgery

## 2015-01-20 ENCOUNTER — Encounter: Payer: Self-pay | Admitting: Vascular Surgery

## 2015-01-20 ENCOUNTER — Ambulatory Visit (INDEPENDENT_AMBULATORY_CARE_PROVIDER_SITE_OTHER): Payer: Self-pay | Admitting: Vascular Surgery

## 2015-01-20 VITALS — BP 158/91 | HR 59 | Ht 69.0 in | Wt 203.0 lb

## 2015-01-20 DIAGNOSIS — N186 End stage renal disease: Secondary | ICD-10-CM

## 2015-01-20 NOTE — Progress Notes (Signed)
Patient name: Matthew Kane MRN: [id number] DOB: [birth year] Sex: male  REASON FOR VISIT: Follow up  HPI: Matthew Kane is a 63 y.o. male who had developed significant swelling overlying the proximal aspect of his AV graft in the left arm. He underwent evacuation of the lymphocele of his left forearm graft and revision of the graft with an interposition 7 mm PTFE graft. In addition I placed a tunneled dialysis catheter. I replaced the lateral half of the graft and this can be used for dialysis in 1 month. The medial half of the graft was degenerative but could be used in the interim I recommended waiting a week before using that aspect of the graft. This is why the catheter was placed.  Since I saw him last he is doing well. He has developed recurrent swelling along the lateral aspect of his graft consistent with a recurrent lymphocele.  Current Outpatient Prescriptions  Medication Sig Dispense Refill  . allopurinol (ZYLOPRIM) 100 MG tablet Take 100 mg by mouth every evening.     Marland Kitchen amLODipine (NORVASC) 10 MG tablet Take 10 mg by mouth every morning.     Marland Kitchen b complex-vitamin c-folic acid (NEPHRO-VITE) 0.8 MG TABS Take 0.8 mg by mouth every morning.     . benzonatate (TESSALON) 100 MG capsule Take 1 capsule (100 mg total) by mouth 3 (three) times daily as needed for cough. 20 capsule 0  . calcitRIOL (ROCALTROL) 0.5 MCG capsule Take 1 capsule by mouth daily.    . calcium carbonate (TUMS - DOSED IN MG ELEMENTAL CALCIUM) 500 MG chewable tablet Chew 1-4 tablets by mouth 3 (three) times daily with meals. Take 4 tablets with largest meal of the day and 1-2 with smaller meals and snacks    . cloNIDine (CATAPRES) 0.3 MG tablet Take 0.3 mg by mouth 2 (two) times daily.    Marland Kitchen HYDROcodone-acetaminophen (NORCO) 5-325 MG per tablet Take 1-2 tablets by mouth every 4 (four) hours as needed (for pain). 20 tablet 0  . metoprolol (LOPRESSOR) 50 MG tablet Take 50 mg by mouth 2 (two) times daily.    . naproxen  sodium (ANAPROX) 220 MG tablet Take 220 mg by mouth once as needed (pain).    . triamcinolone cream (KENALOG) 0.1 % Apply 1 application topically daily as needed (for itching).      No current facility-administered medications for this visit.   REVIEW OF SYSTEMS: Valu.Nieves ] denotes positive finding; [  ] denotes negative finding  CARDIOVASCULAR:  [ ]  chest pain   [ ]  dyspnea on exertion    CONSTITUTIONAL:  [ ]  fever   [ ]  chills  PHYSICAL EXAM: Filed Vitals:   01/20/15 0959  BP: 158/91  Pulse: 59  Height: 5\' 9"  (1.753 m)  Weight: 203 lb (92.08 kg)  SpO2: 99%   GENERAL: The patient is a well-nourished male, in no acute distress. The vital signs are documented above. CARDIOVASCULAR: There is a regular rate and rhythm. PULMONARY: There is good air exchange bilaterally without wheezing or rales. Both the medial and lateral aspects of his graft had an excellent thrill and I think can be used for dialysis. He has recurrent swelling at the antecubital level on the lateral aspect of the graft consistent with a recurrent lymphocele.  MEDICAL ISSUES: END-STAGE RENAL DISEASE: At this point I think both the medial and lateral aspects of the graft can be used for dialysis. Once we know that the graft is working well the catheter can  be removed. I've asked to see him back in 1 month check on his lymphocele. If this continues to enlarge would have to consider evacuation although I have explained that this is associated with a high risk of recurrence.  [name] [facility] [place]: [phone]

## 2015-01-29 ENCOUNTER — Telehealth: Payer: Self-pay

## 2015-01-29 ENCOUNTER — Encounter: Payer: Self-pay | Admitting: Surgery

## 2015-01-29 NOTE — Telephone Encounter (Signed)
Phone call from [facility].  Reported pt. Presented to the kidney center this AM with increased swelling and pain in left arm; stated pt. Was seen by Dr. Florene Glen, and was referred to [facility].  Discussed with Dr. Bridgett Larsson.  Recommended that pt. Elevate the arm, and to f/u next week in the office.  Appt. given to Abigail Butts for 02/01/15 @ 2:30 PM.  Stated she will give appt. to pt.

## 2015-02-01 ENCOUNTER — Encounter: Payer: Self-pay | Admitting: Surgery

## 2015-02-01 ENCOUNTER — Other Ambulatory Visit: Payer: Self-pay

## 2015-02-01 ENCOUNTER — Ambulatory Visit (INDEPENDENT_AMBULATORY_CARE_PROVIDER_SITE_OTHER): Payer: Self-pay | Admitting: Surgery

## 2015-02-01 VITALS — BP 209/114 | HR 68 | Ht 69.0 in | Wt 205.0 lb

## 2015-02-01 DIAGNOSIS — N186 End stage renal disease: Secondary | ICD-10-CM

## 2015-02-01 NOTE — Progress Notes (Signed)
Patient name: Matthew Kane MRN: [id number] DOB: 30-Apr-1951 Sex: male     Chief Complaint  Patient presents with  . Re-evaluation    eval increased swelling and pain in L AVG    HISTORY OF PRESENT ILLNESS: The patient wasn't at all in today.  He underwent evacuation of a lymphocele within his left forearm graft as well as revision with an interposition 7 mm PTFE graft by Dr. Scot Dock on 12/18/2014.  He reports that a new bulge has come out around the graft.  He is now being dialyzed through a catheter.  This area is very painful to him.  Past Medical History  Diagnosis Date  . Hypertension     Echo in 10/2006-moderate LVH, normal EF  . Obesity   . Gout   . Gastroesophageal reflux disease   . Erectile dysfunction   . Prostatic hypertrophy, benign   . Allergic rhinitis   . Difficult intubation     Spasms after LMA inserted -12/13/10  . Chronic kidney disease, stage 5, kidney failure      dialysis at [facility] T/TH/Sat; ESRD as of 05/2011; leg cramps; hypokalemia; microcytosis  . Secondary hyperparathyroidism of renal origin   . Anemia   . Dialysis patient   . History of blood transfusion   . Thrombocytopenia   . Elevated troponin   . Obstructive sleep apnea 2008    2008- uses CPAP    Past Surgical History  Procedure Laterality Date  . Total hip arthroplasty      bilateral  . Portacath placement    . Cholecystectomy    . Finger amputation      right middle  . Colonoscopy  07/12/2011    Procedure: COLONOSCOPY;  Surgeon: Dorothyann Peng, MD;  [facility];  Service: Endoscopy;  Laterality: N/A;  1:00  . Av fistula placement  05-31-11    Right radiocephalic AVF  . Colostomy  Pt denies having this surgery - (12/17/14.    + subsequent takedown secondary to colonic rupture related to trauma  . Joint replacement      times 3  . Av fistula placement  04/01/2012    Procedure: INSERTION OF ARTERIOVENOUS (AV) GORE-TEX GRAFT ARM;  Surgeon: Elam Dutch, MD;   Location: [facility] OR;  Service: Vascular;  Laterality: Left;  . Revison of arteriovenous fistula Left 05/06/2014    Procedure: REVISON OF ARTERIOVENOUS GRAFT;  Surgeon: Conrad Christine, MD;  Location: [place];  Service: Vascular;  Laterality: Left;  . Shuntogram N/A 10/06/2011    Procedure: Earney Mallet;  Surgeon: Elam Dutch, MD;  Location: [facility] CATH LAB;  Service: Cardiovascular;  Laterality: N/A;  . Insertion of dialysis catheter Right 12/18/2014    Procedure: ULTRASOUND GUIDED INSERTION OF RIGHT INTERNAL JUGULAR DIALYSIS CATHETER;  Surgeon: Angelia Mould, MD;  Location: [place];  Service: Vascular;  Laterality: Right;  . Hematoma evacuation Left 12/18/2014    Procedure: EVACUATION OF LEFT ARM LYMPHOCELE;  Surgeon: Angelia Mould, MD;  Location: [place];  Service: Vascular;  Laterality: Left;  . Av fistula placement Left 12/18/2014    Procedure: REVISION OF LEFT ARM ARTERIOVENOUS (AV) GORE-TEX GRAFT WITH INTERPOSITION OF 65mm GRAFT;  Surgeon: Angelia Mould, MD;  Location: [place];  Service: Vascular;  Laterality: Left;    History   Social History  . Marital Status: Divorced    Spouse Name: N/A  . Number of Children: 3  . Years of Education: N/A   Occupational  History  . Retired     [employer]   Social History Main Topics  . Smoking status: Former Smoker -- 1.00 packs/day for 10 years    Types: Cigarettes    Quit date: 01/11/1975  . Smokeless tobacco: Never Used  . Alcohol Use: No  . Drug Use: No  . Sexual Activity: Not on file   Other Topics Concern  . Not on file   Social History Narrative   Lives alone    Family History  Problem Relation Age of Onset  . Lung cancer Father 32    hepatic metastases  . Heart attack Mother 41  . Cancer Brother     unk    Allergies as of 02/01/2015 - Review Complete 02/01/2015  Allergen Reaction Noted  . Penicillins Itching and Rash 10/29/2006    Current Outpatient Prescriptions on File Prior to Visit  Medication Sig  Dispense Refill  . allopurinol (ZYLOPRIM) 100 MG tablet Take 100 mg by mouth every evening.     Marland Kitchen amLODipine (NORVASC) 10 MG tablet Take 10 mg by mouth every morning.     Marland Kitchen b complex-vitamin c-folic acid (NEPHRO-VITE) 0.8 MG TABS Take 0.8 mg by mouth every morning.     . benzonatate (TESSALON) 100 MG capsule Take 1 capsule (100 mg total) by mouth 3 (three) times daily as needed for cough. 20 capsule 0  . calcitRIOL (ROCALTROL) 0.5 MCG capsule Take 1 capsule by mouth daily.    . calcium carbonate (TUMS - DOSED IN MG ELEMENTAL CALCIUM) 500 MG chewable tablet Chew 1-4 tablets by mouth 3 (three) times daily with meals. Take 4 tablets with largest meal of the day and 1-2 with smaller meals and snacks    . cloNIDine (CATAPRES) 0.3 MG tablet Take 0.3 mg by mouth 2 (two) times daily.    . metoprolol (LOPRESSOR) 50 MG tablet Take 50 mg by mouth 2 (two) times daily.    . naproxen sodium (ANAPROX) 220 MG tablet Take 220 mg by mouth once as needed (pain).    . triamcinolone cream (KENALOG) 0.1 % Apply 1 application topically daily as needed (for itching).     Marland Kitchen HYDROcodone-acetaminophen (NORCO) 5-325 MG per tablet Take 1-2 tablets by mouth every 4 (four) hours as needed (for pain). (Patient not taking: Reported on 02/01/2015) 20 tablet 0   No current facility-administered medications on file prior to visit.     REVIEW OF SYSTEMS: See history of present illness  PHYSICAL EXAMINATION:   Vital signs are  Filed Vitals:   02/01/15 1422  BP: 209/114  Pulse: 68  Height: 5\' 9"  (1.753 m)  Weight: 205 lb (92.987 kg)  SpO2: 96%   Body mass index is 30.26 kg/(m^2). General: The patient appears their stated age. HEENT:  No gross abnormalities Pulmonary:  Non labored breathing Musculoskeletal: There are no major deformities. Neurologic: No focal weakness or paresthesias are detected, Skin: There are no ulcer or rashes noted. Psychiatric: The patient has normal affect. Cardiovascular: Palpable thrill  within left forearm graft.  There are areas of fluid near the antecubital crease which the patient states have gotten significantly larger   Diagnostic Studies None  Assessment: End-stage renal disease Plan: Likely recurrence of the seroma within the left forearm graft.  These areas of very uncomfortable for the patient and he is not able to be dialyzed with the way a look currently.  He does have a good thrill within his graft.  I proposed returning to the operating room  for evacuation of what is likely a lymphocele.  I discussed that this graft is likely nearing the end of its use and may needed to be ligated at the time of his operation.  He wants to wait to get this done until he returns from vacation.  We will try to accommodate him on a nondialysis day.  Again, the plan would be to evacuate what is hopefully just a lymphocyle.  I was very clear with the patient, that he may need to have his graft removed in order to get this area to heal  V. Leia Alf, M.D. [facility] [place] Office: [phone] Pager:  [phone]

## 2015-02-18 ENCOUNTER — Encounter (HOSPITAL_COMMUNITY): Payer: Self-pay | Admitting: Vascular Surgery

## 2015-02-18 MED ORDER — SODIUM CHLORIDE 0.9 % IV SOLN
INTRAVENOUS | Status: DC
  Administered 2015-02-19 (×2): via INTRAVENOUS

## 2015-02-18 MED ORDER — VANCOMYCIN HCL IN DEXTROSE 1-5 GM/200ML-% IV SOLN
1000.0000 mg | INTRAVENOUS | Status: AC
  Administered 2015-02-19: 1000 mg via INTRAVENOUS
  Filled 2015-02-18: qty 200

## 2015-02-18 NOTE — Progress Notes (Signed)
Anesthesia Chart Review: SAME DAY WORK-UP.  Patient is a 64 year old male posted for revision of of LUA AVGG, removal of AVGG, possible evacuation of seroma LUA on 02/19/15 by Dr. Scot Dock.  History includes former smoker, HTN, BPH, GERD, ESRD on HD (TTS) with LUE AVVG s/p revision and evacuation of lymphocele and insertion of right IJ dialysis catheter 12/18/14, secondary hyperparathyroidism, thrombocytopenia, anemia, OSA with CPAP, bilateral THA, right middle finger amputation. PCP is Dr. Iona Beard.  For anesthesia history he reported spasms after LMA 12/13/10.  He was seen by cardiologist Dr. Carlyle Dolly on 12/04/14 for an elevated troponin (high 0.22) in the setting of ESRD and acute viral illness--thus Dr. Harl Bowie felt it was a non-specific finding but potentially have a component of viral perimyocarditis or increased demand from viral syndrome causing the elevated troponin. He started ASA and ordered an echo that showed normal LVEF with no regional wall motion abnormalities, grade 2 diastolic dysfunction, mild AR/MR. CTA was negative for PE.  11/25/14 EKG: SB with first degree AVB, septal infarct (age undetermined). Poor r wave progression. Non-specific T wave abnormality.  11/25/14 Echo: Study Conclusions - Left ventricle: The cavity size was normal. There was focal basal hypertrophy. Systolic function was normal. The estimated ejection fraction was in the range of 55% to 60%. Wall motion was normal; there were no regional wall motion abnormalities. Features areconsistent with a pseudonormal left ventricular filling pattern,with concomitant abnormal relaxation and increased fillingpressure (grade 2 diastolic dysfunction). - Aortic valve: Mildly calcified annulus. Trileaflet; moderately thickened leaflets. There was mild regurgitation. Valve area (VTI): 2.92 cm^2. Valve area (Vmax): 2.83 cm^2. - Mitral valve: Mildly calcified annulus. Mildly thickened leaflets. There was mild regurgitation. -  Left atrium: The atrium was severely dilated. - Right atrium: The atrium was mildly dilated. - Technically adequate study.  01/19/12 Stress Echo: Study Conclusions - Stress ECG conclusions: Somewhat submaximal heart rate at peak exercise. There were no stress arrhythmias or conduction abnormalities. The stress ECG was negative for ischemia. - Staged echo: There was no echocardiographic evidence for stress-induced ischemia.  07/05/05 LHC: DIAGNOSIS: No significant flow-limiting coronary atherosclerosis noted in the major epicardial vessels. The patient's coronary arteries are very large and were difficult to completely opacify, although there looks to be only minor luminal irregularities without any stenotic atherosclerosis.  12/18/14 1V CXR: IMPRESSION: 1. Normal Dialysis catheter noted with tip projected over superior vena cava. 2. Cardiomegaly. No pulmonary venous congestion.  He is for labs on arrival. He was thrombocytopenic with his latest labs in [place] [phone] 11/2014), but PLT count was not repeated prior to his last [place] revision in May, so I would defer additional orders to his surgeon.   If labs are acceptable and no acute CV/CHF symptoms then I would anticipate that he could proceed as planned.  Reviewed with anesthesiologist Dr. Tamala Julian.  George Hugh [facility] Short Stay Center/Anesthesiology Phone [phone] 02/18/2015 2:23 PM

## 2015-02-18 NOTE — Progress Notes (Signed)
Several unsuccessful attempts have been made to contact pt. Recording now states " no remaining time," after just ringing previously. Left voice message on emergency contact number.

## 2015-02-19 ENCOUNTER — Ambulatory Visit (HOSPITAL_COMMUNITY): Payer: Medicare HMO | Admitting: Vascular Surgery

## 2015-02-19 ENCOUNTER — Ambulatory Visit (HOSPITAL_COMMUNITY)
Admission: RE | Admit: 2015-02-19 | Discharge: 2015-02-19 | Disposition: A | Discharge status: Home / Self Care | Payer: Medicare HMO | Source: Ambulatory Visit | Attending: Surgery | Admitting: Surgery

## 2015-02-19 ENCOUNTER — Encounter (HOSPITAL_COMMUNITY): Payer: Self-pay | Admitting: *Deleted

## 2015-02-19 ENCOUNTER — Encounter (HOSPITAL_COMMUNITY)
Admission: RE | Disposition: A | Discharge status: Home / Self Care | Payer: Self-pay | Source: Ambulatory Visit | Attending: Surgery

## 2015-02-19 DIAGNOSIS — Z87891 Personal history of nicotine dependence: Secondary | ICD-10-CM | POA: Diagnosis not present

## 2015-02-19 DIAGNOSIS — Z992 Dependence on renal dialysis: Secondary | ICD-10-CM | POA: Diagnosis not present

## 2015-02-19 DIAGNOSIS — N186 End stage renal disease: Secondary | ICD-10-CM | POA: Insufficient documentation

## 2015-02-19 DIAGNOSIS — I120 Hypertensive chronic kidney disease with stage 5 chronic kidney disease or end stage renal disease: Secondary | ICD-10-CM | POA: Diagnosis not present

## 2015-02-19 DIAGNOSIS — I898 Other specified noninfective disorders of lymphatic vessels and lymph nodes: Secondary | ICD-10-CM | POA: Insufficient documentation

## 2015-02-19 DIAGNOSIS — T82898A Other specified complication of vascular prosthetic devices, implants and grafts, initial encounter: Secondary | ICD-10-CM | POA: Diagnosis not present

## 2015-02-19 HISTORY — PX: AVGG REMOVAL: SHX5153

## 2015-02-19 HISTORY — PX: HEMATOMA EVACUATION: SHX5118

## 2015-02-19 LAB — POCT I-STAT, CHEM 8
BUN: 4 mg/dL — ABNORMAL LOW (ref 6–20)
CALCIUM ION: 1.17 mmol/L (ref 1.13–1.30)
CREATININE: 7.5 mg/dL — AB (ref 0.61–1.24)
Chloride: 91 mmol/L — ABNORMAL LOW (ref 101–111)
GLUCOSE: 80 mg/dL (ref 65–99)
HCT: 28 % — ABNORMAL LOW (ref 39.0–52.0)
HEMOGLOBIN: 9.5 g/dL — AB (ref 13.0–17.0)
Potassium: 4 mmol/L (ref 3.5–5.1)
Sodium: 137 mmol/L (ref 135–145)
TCO2: 29 mmol/L (ref 0–100)

## 2015-02-19 SURGERY — REMOVAL OF ARTERIOVENOUS GORETEX GRAFT (AVGG)
Anesthesia: General | Site: Arm Lower | Laterality: Left

## 2015-02-19 MED ORDER — LIDOCAINE HCL (CARDIAC) 10 MG/ML IV SOLN
INTRAVENOUS | Status: DC | PRN
  Administered 2015-02-19: 100 mg via INTRAVENOUS

## 2015-02-19 MED ORDER — PROPOFOL 10 MG/ML IV BOLUS
INTRAVENOUS | Status: AC
  Filled 2015-02-19: qty 20

## 2015-02-19 MED ORDER — LIDOCAINE HCL (CARDIAC) 20 MG/ML IV SOLN
INTRAVENOUS | Status: AC
  Filled 2015-02-19: qty 5

## 2015-02-19 MED ORDER — THROMBIN 20000 UNITS EX SOLR
CUTANEOUS | Status: AC
  Filled 2015-02-19: qty 20000

## 2015-02-19 MED ORDER — CHLORHEXIDINE GLUCONATE CLOTH 2 % EX PADS: 6.0000 | MEDICATED_PAD | Freq: Once | CUTANEOUS | Status: DC

## 2015-02-19 MED ORDER — ONDANSETRON HCL 4 MG/2ML IJ SOLN
INTRAMUSCULAR | Status: DC | PRN
  Administered 2015-02-19: 4 mg via INTRAVENOUS

## 2015-02-19 MED ORDER — MIDAZOLAM HCL 2 MG/2ML IJ SOLN
INTRAMUSCULAR | Status: AC
  Filled 2015-02-19: qty 2

## 2015-02-19 MED ORDER — PROPOFOL 10 MG/ML IV BOLUS
INTRAVENOUS | Status: DC | PRN
  Administered 2015-02-19: 200 mg via INTRAVENOUS

## 2015-02-19 MED ORDER — OXYCODONE HCL 5 MG PO TABS
ORAL_TABLET | ORAL | Status: AC
  Filled 2015-02-19: qty 1

## 2015-02-19 MED ORDER — OXYCODONE HCL 5 MG PO TABS
5.0000 mg | ORAL_TABLET | Freq: Once | ORAL | Status: AC
  Administered 2015-02-19: 5 mg via ORAL

## 2015-02-19 MED ORDER — OXYCODONE HCL 5 MG PO TABS: 15.0000 mg | ORAL_TABLET | Freq: Once | ORAL | Status: DC

## 2015-02-19 MED ORDER — HYDROMORPHONE HCL 1 MG/ML IJ SOLN: 0.5000 mg | INTRAMUSCULAR | Status: DC | PRN

## 2015-02-19 MED ORDER — LIDOCAINE HCL (PF) 1 % IJ SOLN
INTRAMUSCULAR | Status: AC
  Filled 2015-02-19: qty 30

## 2015-02-19 MED ORDER — ONDANSETRON HCL 4 MG/2ML IJ SOLN
INTRAMUSCULAR | Status: AC
  Filled 2015-02-19: qty 2

## 2015-02-19 MED ORDER — OXYCODONE HCL 5 MG PO TABS: 5.0000 mg | ORAL_TABLET | Freq: Four times a day (QID) | ORAL | Status: AC | PRN

## 2015-02-19 MED ORDER — FENTANYL CITRATE (PF) 100 MCG/2ML IJ SOLN
INTRAMUSCULAR | Status: DC | PRN
  Administered 2015-02-19 (×4): 50 ug via INTRAVENOUS

## 2015-02-19 MED ORDER — SODIUM CHLORIDE 0.9 % IR SOLN
Status: DC | PRN
  Administered 2015-02-19: 10:00:00

## 2015-02-19 MED ORDER — ONDANSETRON HCL 4 MG/2ML IJ SOLN: 4.0000 mg | Freq: Once | INTRAMUSCULAR | Status: DC | PRN

## 2015-02-19 MED ORDER — FENTANYL CITRATE (PF) 250 MCG/5ML IJ SOLN
INTRAMUSCULAR | Status: AC
  Filled 2015-02-19: qty 5

## 2015-02-19 MED ORDER — MIDAZOLAM HCL 5 MG/5ML IJ SOLN
INTRAMUSCULAR | Status: DC | PRN
  Administered 2015-02-19: 1 mg via INTRAVENOUS

## 2015-02-19 MED ORDER — 0.9 % SODIUM CHLORIDE (POUR BTL) OPTIME
TOPICAL | Status: DC | PRN
  Administered 2015-02-19: 1000 mL

## 2015-02-19 SURGICAL SUPPLY — 44 items
BANDAGE ELASTIC 4 VELCRO ST LF (GAUZE/BANDAGES/DRESSINGS) ×3 IMPLANT
BNDG GAUZE ELAST 4 BULKY (GAUZE/BANDAGES/DRESSINGS) ×3 IMPLANT
CANISTER SUCTION 2500CC (MISCELLANEOUS) ×4 IMPLANT
CANNULA VESSEL 3MM 2 BLNT TIP (CANNULA) ×3 IMPLANT
CLIP TI MEDIUM 6 (CLIP) ×4 IMPLANT
CLIP TI WIDE RED SMALL 6 (CLIP) ×4 IMPLANT
DECANTER SPIKE VIAL GLASS SM (MISCELLANEOUS) ×4 IMPLANT
ELECT REM PT RETURN 9FT ADLT (ELECTROSURGICAL) ×4
ELECTRODE REM PT RTRN 9FT ADLT (ELECTROSURGICAL) ×2 IMPLANT
GLOVE BIO SURGEON STRL SZ 6.5 (GLOVE) ×4 IMPLANT
GLOVE BIO SURGEON STRL SZ7.5 (GLOVE) ×7 IMPLANT
GLOVE BIO SURGEONS STRL SZ 6.5 (GLOVE) ×2
GLOVE BIOGEL PI IND STRL 6.5 (GLOVE) ×4 IMPLANT
GLOVE BIOGEL PI IND STRL 7.0 (GLOVE) ×1 IMPLANT
GLOVE BIOGEL PI IND STRL 8 (GLOVE) ×3 IMPLANT
GLOVE BIOGEL PI INDICATOR 6.5 (GLOVE) ×8
GLOVE BIOGEL PI INDICATOR 7.0 (GLOVE) ×2
GLOVE BIOGEL PI INDICATOR 8 (GLOVE) ×4
GLOVE ECLIPSE 7.5 STRL STRAW (GLOVE) ×9 IMPLANT
GLOVE SS BIOGEL STRL SZ 6.5 (GLOVE) ×1 IMPLANT
GLOVE SUPERSENSE BIOGEL SZ 6.5 (GLOVE) ×2
GOWN STRL REUS W/ TWL LRG LVL3 (GOWN DISPOSABLE) ×6 IMPLANT
GOWN STRL REUS W/ TWL XL LVL3 (GOWN DISPOSABLE) ×4 IMPLANT
GOWN STRL REUS W/TWL LRG LVL3 (GOWN DISPOSABLE) ×12
GOWN STRL REUS W/TWL XL LVL3 (GOWN DISPOSABLE) ×16
KIT BASIN OR (CUSTOM PROCEDURE TRAY) ×4 IMPLANT
KIT ROOM TURNOVER OR (KITS) ×4 IMPLANT
LIQUID BAND (GAUZE/BANDAGES/DRESSINGS) ×1 IMPLANT
NDL HYPO 25GX1X1/2 BEV (NEEDLE) ×1 IMPLANT
NEEDLE HYPO 25GX1X1/2 BEV (NEEDLE) ×4 IMPLANT
NS IRRIG 1000ML POUR BTL (IV SOLUTION) ×4 IMPLANT
PACK CV ACCESS (CUSTOM PROCEDURE TRAY) ×4 IMPLANT
PAD ARMBOARD 7.5X6 YLW CONV (MISCELLANEOUS) ×8 IMPLANT
SPONGE GAUZE 4X4 12PLY STER LF (GAUZE/BANDAGES/DRESSINGS) ×3 IMPLANT
SPONGE LAP 18X18 X RAY DECT (DISPOSABLE) ×3 IMPLANT
SPONGE SURGIFOAM ABS GEL 100 (HEMOSTASIS) IMPLANT
SUT ETHILON 3 0 PS 1 (SUTURE) ×6 IMPLANT
SUT PROLENE 5 0 C 1 24 (SUTURE) ×3 IMPLANT
SUT PROLENE 6 0 BV (SUTURE) ×11 IMPLANT
SUT VIC AB 3-0 SH 27 (SUTURE) ×8
SUT VIC AB 3-0 SH 27X BRD (SUTURE) ×4 IMPLANT
SUT VICRYL 4-0 PS2 18IN ABS (SUTURE) ×8 IMPLANT
UNDERPAD 30X30 INCONTINENT (UNDERPADS AND DIAPERS) ×4 IMPLANT
WATER STERILE IRR 1000ML POUR (IV SOLUTION) ×4 IMPLANT

## 2015-02-19 NOTE — Anesthesia Procedure Notes (Signed)
Procedure Name: LMA Insertion Date/Time: 02/19/2015 11:09 AM Performed by: Greggory Stallion, Matthew Kane Pre-anesthesia Checklist: Patient identified, Emergency Drugs available, Suction available, Patient being monitored and Timeout performed Patient Re-evaluated:Patient Re-evaluated prior to inductionOxygen Delivery Method: Circle system utilized Preoxygenation: Pre-oxygenation with 100% oxygen Intubation Type: IV induction Ventilation: Mask ventilation with difficulty LMA: LMA inserted LMA Size: 5.0 Number of attempts: 1 Placement Confirmation: positive ETCO2 and breath sounds checked- equal and bilateral Tube secured with: Tape Dental Injury: Teeth and Oropharynx as per pre-operative assessment

## 2015-02-19 NOTE — Op Note (Signed)
    NAME: Matthew Kane   MRN: [id number] DOB: 03-Sep-1950    DATE OF OPERATION: 02/19/2015  PREOP DIAGNOSIS: lymphocele left AV graft  POSTOP DIAGNOSIS: same  PROCEDURE: evacuation of lymphoceles of left AV graft and ligation of left forearm AV graft  SURGEON: Judeth Cornfield. Scot Dock, MD, FACS  ASSIST: Delena Serve, RNFA  ANESTHESIA: Gen.   EBL: minimal  INDICATIONS: Matthew Kane is a 64 y.o. male who presented with a large recurrent lymphoceles of his left forearm AV graft. Graft ligation and evacuation of the lymphoceles was recommended.  FINDINGS: I oversewed the arterial limb of the graft leaving only a small stump of the graft. The venous limb was divided also. A large amount of  lymphocele was evacuated.  TECHNIQUE: The patient was taken to the operating room and received general anesthetic. The left upper extremity was prepped and draped in usual sterile fashion. An incision was made over the large lymphocele at the antecubital level and here large amount lymphocele was evacuated. Chair limited graft here was divided and oversewn proximally and distally. The arterial anastomosis was actually above the antecubital level as was the venous anastomosis. Therefore separate incision above the antecubital level was made and here large amount lymphocele was evacuated. The venous limb of the graft was taken down from the vein which was oversewn with a 50 proline sutures. The arterial anastomosis was then applied. The graft was clamped just above it and then divided and the small remnant was oversewn with a 60 proline suture. All of the lymphocele was evacuated and hemostasis obtained in the wounds. The wound at the antecubital level was closed with interrupted 30 vertical mattress sutures. The wound above the antecubital level was closed with a deeper 3-0 Vicryl and the skin closed with 4-0 Vicryl. Sterile dressing was applied. The patient tolerated the procedure well and transferred to  the recovery room in stable condition. All needle and sponge counts were correct.  [name], MD, FACS [facility]  DATE OF DICTATION:   02/19/2015

## 2015-02-19 NOTE — Progress Notes (Signed)
Pt did not take his beta blocker this am, not given here due to heart rate 52.

## 2015-02-19 NOTE — H&P (View-Only) (Signed)
Patient name: Matthew Kane MRN: [id number] DOB: 1950-12-25 Sex: male     Chief Complaint  Patient presents with  . Re-evaluation    eval increased swelling and pain in L AVG    HISTORY OF PRESENT ILLNESS: The patient wasn't at all in today.  He underwent evacuation of a lymphocele within his left forearm graft as well as revision with an interposition 7 mm PTFE graft by Dr. Scot Dock on 12/18/2014.  He reports that a new bulge has come out around the graft.  He is now being dialyzed through a catheter.  This area is very painful to him.  Past Medical History  Diagnosis Date  . Hypertension     Echo in 10/2006-moderate LVH, normal EF  . Obesity   . Gout   . Gastroesophageal reflux disease   . Erectile dysfunction   . Prostatic hypertrophy, benign   . Allergic rhinitis   . Difficult intubation     Spasms after LMA inserted -12/13/10  . Chronic kidney disease, stage 5, kidney failure      dialysis at [facility] T/TH/Sat; ESRD as of 05/2011; leg cramps; hypokalemia; microcytosis  . Secondary hyperparathyroidism of renal origin   . Anemia   . Dialysis patient   . History of blood transfusion   . Thrombocytopenia   . Elevated troponin   . Obstructive sleep apnea 2008    2008- uses CPAP    Past Surgical History  Procedure Laterality Date  . Total hip arthroplasty      bilateral  . Portacath placement    . Cholecystectomy    . Finger amputation      right middle  . Colonoscopy  07/12/2011    Procedure: COLONOSCOPY;  Surgeon: Dorothyann Peng, MD;  [facility];  Service: Endoscopy;  Laterality: N/A;  1:00  . Av fistula placement  05-31-11    Right radiocephalic AVF  . Colostomy  Pt denies having this surgery - (12/17/14.    + subsequent takedown secondary to colonic rupture related to trauma  . Joint replacement      times 3  . Av fistula placement  04/01/2012    Procedure: INSERTION OF ARTERIOVENOUS (AV) GORE-TEX GRAFT ARM;  Surgeon: Elam Dutch, MD;   Location: [facility] OR;  Service: Vascular;  Laterality: Left;  . Revison of arteriovenous fistula Left 05/06/2014    Procedure: REVISON OF ARTERIOVENOUS GRAFT;  Surgeon: Conrad Greenfield, MD;  Location: [place];  Service: Vascular;  Laterality: Left;  . Shuntogram N/A 10/06/2011    Procedure: Earney Mallet;  Surgeon: Elam Dutch, MD;  Location: [facility] CATH LAB;  Service: Cardiovascular;  Laterality: N/A;  . Insertion of dialysis catheter Right 12/18/2014    Procedure: ULTRASOUND GUIDED INSERTION OF RIGHT INTERNAL JUGULAR DIALYSIS CATHETER;  Surgeon: Angelia Mould, MD;  Location: [place];  Service: Vascular;  Laterality: Right;  . Hematoma evacuation Left 12/18/2014    Procedure: EVACUATION OF LEFT ARM LYMPHOCELE;  Surgeon: Angelia Mould, MD;  Location: [place];  Service: Vascular;  Laterality: Left;  . Av fistula placement Left 12/18/2014    Procedure: REVISION OF LEFT ARM ARTERIOVENOUS (AV) GORE-TEX GRAFT WITH INTERPOSITION OF 11mm GRAFT;  Surgeon: Angelia Mould, MD;  Location: [place];  Service: Vascular;  Laterality: Left;    History   Social History  . Marital Status: Divorced    Spouse Name: N/A  . Number of Children: 3  . Years of Education: N/A   Occupational  History  . Retired     [employer]   Social History Main Topics  . Smoking status: Former Smoker -- 1.00 packs/day for 10 years    Types: Cigarettes    Quit date: 01/11/1975  . Smokeless tobacco: Never Used  . Alcohol Use: No  . Drug Use: No  . Sexual Activity: Not on file   Other Topics Concern  . Not on file   Social History Narrative   Lives alone    Family History  Problem Relation Age of Onset  . Lung cancer Father 62    hepatic metastases  . Heart attack Mother 55  . Cancer Brother     unk    Allergies as of 02/01/2015 - Review Complete 02/01/2015  Allergen Reaction Noted  . Penicillins Itching and Rash 10/29/2006    Current Outpatient Prescriptions on File Prior to Visit  Medication Sig  Dispense Refill  . allopurinol (ZYLOPRIM) 100 MG tablet Take 100 mg by mouth every evening.     Marland Kitchen amLODipine (NORVASC) 10 MG tablet Take 10 mg by mouth every morning.     Marland Kitchen b complex-vitamin c-folic acid (NEPHRO-VITE) 0.8 MG TABS Take 0.8 mg by mouth every morning.     . benzonatate (TESSALON) 100 MG capsule Take 1 capsule (100 mg total) by mouth 3 (three) times daily as needed for cough. 20 capsule 0  . calcitRIOL (ROCALTROL) 0.5 MCG capsule Take 1 capsule by mouth daily.    . calcium carbonate (TUMS - DOSED IN MG ELEMENTAL CALCIUM) 500 MG chewable tablet Chew 1-4 tablets by mouth 3 (three) times daily with meals. Take 4 tablets with largest meal of the day and 1-2 with smaller meals and snacks    . cloNIDine (CATAPRES) 0.3 MG tablet Take 0.3 mg by mouth 2 (two) times daily.    . metoprolol (LOPRESSOR) 50 MG tablet Take 50 mg by mouth 2 (two) times daily.    . naproxen sodium (ANAPROX) 220 MG tablet Take 220 mg by mouth once as needed (pain).    . triamcinolone cream (KENALOG) 0.1 % Apply 1 application topically daily as needed (for itching).     Marland Kitchen HYDROcodone-acetaminophen (NORCO) 5-325 MG per tablet Take 1-2 tablets by mouth every 4 (four) hours as needed (for pain). (Patient not taking: Reported on 02/01/2015) 20 tablet 0   No current facility-administered medications on file prior to visit.     REVIEW OF SYSTEMS: See history of present illness  PHYSICAL EXAMINATION:   Vital signs are  Filed Vitals:   02/01/15 1422  BP: 209/114  Pulse: 68  Height: 5\' 9"  (1.753 m)  Weight: 205 lb (92.987 kg)  SpO2: 96%   Body mass index is 30.26 kg/(m^2). General: The patient appears their stated age. HEENT:  No gross abnormalities Pulmonary:  Non labored breathing Musculoskeletal: There are no major deformities. Neurologic: No focal weakness or paresthesias are detected, Skin: There are no ulcer or rashes noted. Psychiatric: The patient has normal affect. Cardiovascular: Palpable thrill  within left forearm graft.  There are areas of fluid near the antecubital crease which the patient states have gotten significantly larger   Diagnostic Studies None  Assessment: End-stage renal disease Plan: Likely recurrence of the seroma within the left forearm graft.  These areas of very uncomfortable for the patient and he is not able to be dialyzed with the way a look currently.  He does have a good thrill within his graft.  I proposed returning to the operating room  for evacuation of what is likely a lymphocele.  I discussed that this graft is likely nearing the end of its use and may needed to be ligated at the time of his operation.  He wants to wait to get this done until he returns from vacation.  We will try to accommodate him on a nondialysis day.  Again, the plan would be to evacuate what is hopefully just a lymphocyle.  I was very clear with the patient, that he may need to have his graft removed in order to get this area to heal  V. Leia Alf, M.D. [facility] [place] Office: [phone] Pager:  [phone]

## 2015-02-19 NOTE — Anesthesia Preprocedure Evaluation (Signed)
Anesthesia Evaluation  Patient identified by MRN, date of birth, ID band Patient awake    Reviewed: Allergy & Precautions, NPO status , Patient's Chart, lab work & pertinent test results  Airway Mallampati: II       Dental   Pulmonary sleep apnea , former smoker,    Pulmonary exam normal       Cardiovascular hypertension, Normal cardiovascular exam    Neuro/Psych    GI/Hepatic GERD-  ,  Endo/Other    Renal/GU ESRF and DialysisRenal disease     Musculoskeletal   Abdominal   Peds  Hematology  (+) anemia ,   Anesthesia Other Findings   Reproductive/Obstetrics                             Anesthesia Physical Anesthesia Plan  ASA: III  Anesthesia Plan: General   Post-op Pain Management:    Induction: Intravenous  Airway Management Planned: LMA  Additional Equipment:   Intra-op Plan:   Post-operative Plan: Extubation in OR  Informed Consent: I have reviewed the patients History and Physical, chart, labs and discussed the procedure including the risks, benefits and alternatives for the proposed anesthesia with the patient or authorized representative who has indicated his/her understanding and acceptance.     Plan Discussed with: CRNA, Anesthesiologist and Surgeon  Anesthesia Plan Comments:         Anesthesia Quick Evaluation

## 2015-02-19 NOTE — Anesthesia Postprocedure Evaluation (Signed)
  Anesthesia Post-op Note  Patient: Matthew Kane  Procedure(s) Performed: Procedure(s): LIGATION OF ARTERIOVENOUS GORETEX GRAFT LEFT FOREARM (Left) EVACUATION LYMPHOCELE LEFT FOREARM (Left)  Patient Location: PACU  Anesthesia Type:General  Level of Consciousness: awake, alert , oriented and patient cooperative  Airway and Oxygen Therapy: Patient Spontanous Breathing  Post-op Pain: mild  Post-op Assessment: Post-op Vital signs reviewed, Patient's Cardiovascular Status Stable, Respiratory Function Stable, Patent Airway, No signs of Nausea or vomiting and Pain level controlled              Post-op Vital Signs: stable  Last Vitals:  Filed Vitals:   02/19/15 1330  BP:   Pulse: 47  Temp:   Resp: 13    Complications: No apparent anesthesia complications

## 2015-02-19 NOTE — Transfer of Care (Signed)
Immediate Anesthesia Transfer of Care Note  Patient: Matthew Kane  Procedure(s) Performed: Procedure(s): LIGATION OF ARTERIOVENOUS GORETEX GRAFT LEFT FOREARM (Left) EVACUATION LYMPHOCELE LEFT FOREARM (Left)  Patient Location: PACU  Anesthesia Type:General  Level of Consciousness: awake, alert , oriented and patient cooperative  Airway & Oxygen Therapy: Patient Spontanous Breathing and Patient connected to nasal cannula oxygen  Post-op Assessment: Report given to RN, Post -op Vital signs reviewed and stable and Patient moving all extremities  Post vital signs: Reviewed and stable  Last Vitals:  Filed Vitals:   02/19/15 1303  BP: 172/93  Pulse: 52  Temp:   Resp: 13    Complications: No apparent anesthesia complications

## 2015-02-19 NOTE — Interval H&P Note (Signed)
History and Physical Interval Note:  02/19/2015 10:49 AM  Matthew Kane  has presented today for surgery, with the diagnosis of End Stage Renal Disease N18.6  The various methods of treatment have been discussed with the patient and family. After consideration of risks, benefits and other options for treatment, the patient has consented to  Procedure(s): REVISION OF ARTERIOVENOUS GORETEX GRAFT (Left) REMOVAL OF ARTERIOVENOUS GORETEX GRAFT (AVGG) (Left) POSSIBLE EVACUATION SEROMA (Left) as a surgical intervention .  The patient's history has been reviewed, patient examined, no change in status, stable for surgery.  I have reviewed the patient's chart and labs.  Questions were answered to the patient's satisfaction.     [name]

## 2015-02-21 ENCOUNTER — Encounter (HOSPITAL_COMMUNITY): Payer: Self-pay | Admitting: *Deleted

## 2015-02-21 ENCOUNTER — Emergency Department (HOSPITAL_COMMUNITY)
Admission: EM | Admit: 2015-02-21 | Discharge: 2015-02-21 | Disposition: A | Discharge status: Home / Self Care | Payer: Medicare HMO | Attending: Emergency Medicine | Admitting: Emergency Medicine

## 2015-02-21 DIAGNOSIS — Z9889 Other specified postprocedural states: Secondary | ICD-10-CM | POA: Insufficient documentation

## 2015-02-21 DIAGNOSIS — Z8669 Personal history of other diseases of the nervous system and sense organs: Secondary | ICD-10-CM | POA: Diagnosis not present

## 2015-02-21 DIAGNOSIS — Z87891 Personal history of nicotine dependence: Secondary | ICD-10-CM | POA: Diagnosis not present

## 2015-02-21 DIAGNOSIS — I120 Hypertensive chronic kidney disease with stage 5 chronic kidney disease or end stage renal disease: Secondary | ICD-10-CM | POA: Insufficient documentation

## 2015-02-21 DIAGNOSIS — Z79899 Other long term (current) drug therapy: Secondary | ICD-10-CM | POA: Diagnosis not present

## 2015-02-21 DIAGNOSIS — N186 End stage renal disease: Secondary | ICD-10-CM | POA: Insufficient documentation

## 2015-02-21 DIAGNOSIS — Z9981 Dependence on supplemental oxygen: Secondary | ICD-10-CM | POA: Insufficient documentation

## 2015-02-21 DIAGNOSIS — L7621 Postprocedural hemorrhage and hematoma of skin and subcutaneous tissue following a dermatologic procedure: Secondary | ICD-10-CM | POA: Diagnosis not present

## 2015-02-21 DIAGNOSIS — D696 Thrombocytopenia, unspecified: Secondary | ICD-10-CM | POA: Insufficient documentation

## 2015-02-21 DIAGNOSIS — Z880 Allergy status to penicillin: Secondary | ICD-10-CM | POA: Insufficient documentation

## 2015-02-21 DIAGNOSIS — G4733 Obstructive sleep apnea (adult) (pediatric): Secondary | ICD-10-CM | POA: Diagnosis not present

## 2015-02-21 DIAGNOSIS — M109 Gout, unspecified: Secondary | ICD-10-CM | POA: Diagnosis not present

## 2015-02-21 DIAGNOSIS — Z8719 Personal history of other diseases of the digestive system: Secondary | ICD-10-CM | POA: Diagnosis not present

## 2015-02-21 DIAGNOSIS — N2581 Secondary hyperparathyroidism of renal origin: Secondary | ICD-10-CM | POA: Insufficient documentation

## 2015-02-21 DIAGNOSIS — Z992 Dependence on renal dialysis: Secondary | ICD-10-CM | POA: Insufficient documentation

## 2015-02-21 DIAGNOSIS — E669 Obesity, unspecified: Secondary | ICD-10-CM | POA: Diagnosis not present

## 2015-02-21 DIAGNOSIS — D649 Anemia, unspecified: Secondary | ICD-10-CM | POA: Diagnosis not present

## 2015-02-21 NOTE — Discharge Instructions (Signed)
Keep dressing on for 48 hours. Follow-up your doctor this week.

## 2015-02-21 NOTE — ED Notes (Signed)
Pt had fistuala surgery x 2 days ago and is having bleeding from site today; no bleeding at this time but pt has swelling to hand

## 2015-02-21 NOTE — ED Provider Notes (Signed)
CSN: [id number]     Arrival date & time 02/21/15  2015 History  This chart was scribed for Nat Christen, MD by Irene Pap, ED Scribe. This patient was seen in room APA17/APA17 and patient care was started at 9:27 PM.   Chief Complaint  Patient presents with  . Post-op Problem   The history is provided by the patient. No language interpreter was used.  HPI Comments: ORPHEUS HAYHURST is a 64 y.o. male who presents to the Emergency Department complaining of bleeding from wound repair site onset earlier today. States that they revised graft on left forearm Friday and the incision site began to bleed today; saturated 2 pads. States that it last bled 3 hours ago. States that he lives at home by himself. No fever, sweats, chills.  Past Medical History  Diagnosis Date  . Hypertension     Echo in 10/2006-moderate LVH, normal EF  . Obesity   . Gout   . Gastroesophageal reflux disease   . Erectile dysfunction   . Prostatic hypertrophy, benign   . Allergic rhinitis   . Chronic kidney disease, stage 5, kidney failure      dialysis at [facility] T/TH/Sat; ESRD as of 05/2011; leg cramps; hypokalemia; microcytosis  . Secondary hyperparathyroidism of renal origin   . Anemia   . Dialysis patient   . History of blood transfusion   . Thrombocytopenia   . Elevated troponin   . Obstructive sleep apnea 2008    2008- uses CPAP  . Difficult intubation     Spasms after LMA inserted -12/13/10   Past Surgical History  Procedure Laterality Date  . Total hip arthroplasty      bilateral  . Portacath placement    . Cholecystectomy    . Finger amputation      right middle  . Colonoscopy  07/12/2011    Procedure: COLONOSCOPY;  Surgeon: Dorothyann Peng, MD;  Location: [facility];  Service: Endoscopy;  Laterality: N/A;  1:00  . Av fistula placement  05-31-11    Right radiocephalic AVF  . Colostomy  Pt denies having this surgery - (12/17/14.    + subsequent takedown secondary to colonic rupture related to  trauma  . Joint replacement      times 3  . Av fistula placement  04/01/2012    Procedure: INSERTION OF ARTERIOVENOUS (AV) GORE-TEX GRAFT ARM;  Surgeon: Elam Dutch, MD;  Location: [facility] OR;  Service: Vascular;  Laterality: Left;  . Revison of arteriovenous fistula Left 05/06/2014    Procedure: REVISON OF ARTERIOVENOUS GRAFT;  Surgeon: Conrad [place], MD;  Location: [facility];  Service: Vascular;  Laterality: Left;  . Shuntogram N/A 10/06/2011    Procedure: Earney Mallet;  Surgeon: Elam Dutch, MD;  Location: [facility] CATH LAB;  Service: Cardiovascular;  Laterality: N/A;  . Insertion of dialysis catheter Right 12/18/2014    Procedure: ULTRASOUND GUIDED INSERTION OF RIGHT INTERNAL JUGULAR DIALYSIS CATHETER;  Surgeon: Angelia Mould, MD;  Location: [facility];  Service: Vascular;  Laterality: Right;  . Hematoma evacuation Left 12/18/2014    Procedure: EVACUATION OF LEFT ARM LYMPHOCELE;  Surgeon: Angelia Mould, MD;  Location: [facility];  Service: Vascular;  Laterality: Left;  . Av fistula placement Left 12/18/2014    Procedure: REVISION OF LEFT ARM ARTERIOVENOUS (AV) GORE-TEX GRAFT WITH INTERPOSITION OF 50mm GRAFT;  Surgeon: Angelia Mould, MD;  Location: [facility] OR;  Service: Vascular;  Laterality: Left;   Family History  Problem Relation Age of  Onset  . Lung cancer Father 1    hepatic metastases  . Heart attack Mother 33  . Cancer Brother     unk   History  Substance Use Topics  . Smoking status: Former Smoker -- 1.00 packs/day for 10 years    Types: Cigarettes    Quit date: 01/11/1975  . Smokeless tobacco: Never Used  . Alcohol Use: No    Review of Systems A complete 10 system review of systems was obtained and all systems are negative except as noted in the HPI and PMH.   Allergies  Penicillins  Home Medications   Prior to Admission medications   Medication Sig Start Date End Date Taking? Authorizing Provider  allopurinol (ZYLOPRIM) 100 MG tablet Take 100 mg by mouth every  evening.  06/27/13  Yes Historical Provider, MD  amLODipine (NORVASC) 10 MG tablet Take 10 mg by mouth every evening.    Yes Historical Provider, MD  b complex-vitamin c-folic acid (NEPHRO-VITE) 0.8 MG TABS Take 0.8 mg by mouth every morning.  11/13/11  Yes Historical Provider, MD  calcitRIOL (ROCALTROL) 0.5 MCG capsule Take 1 capsule by mouth daily. 11/04/10  Yes Historical Provider, MD  calcium carbonate (TUMS - DOSED IN MG ELEMENTAL CALCIUM) 500 MG chewable tablet Chew 1-4 tablets by mouth 3 (three) times daily with meals. Take 4 tablets with largest meal of the day and 1-2 with smaller meals and snacks   Yes Historical Provider, MD  cloNIDine (CATAPRES) 0.3 MG tablet Take 0.3 mg by mouth 2 (two) times daily.   Yes Historical Provider, MD  metoprolol (LOPRESSOR) 50 MG tablet Take 50 mg by mouth 2 (two) times daily. 04/09/13  Yes Historical Provider, MD  naproxen sodium (ALEVE) 220 MG tablet Take 220 mg by mouth 2 (two) times daily as needed (Pain).   Yes Historical Provider, MD  oxyCODONE (ROXICODONE) 5 MG immediate release tablet Take 1 tablet (5 mg total) by mouth every 6 (six) hours as needed for severe pain. 02/19/15  Yes Alvia Grove, PA-C  triamcinolone cream (KENALOG) 0.1 % Apply 1 application topically daily as needed (for itching).    Yes Historical Provider, MD   BP 197/103 mmHg  Pulse 60  Temp(Src) 97.9 F (36.6 C) (Oral)  Resp 16  Ht 6' (1.829 m)  Wt 205 lb (92.987 kg)  BMI 27.80 kg/m2  SpO2 100%  Physical Exam  Constitutional: He is oriented to person, place, and time. He appears well-developed and well-nourished.  HENT:  Head: Normocephalic and atraumatic.  Eyes: Conjunctivae and EOM are normal. Pupils are equal, round, and reactive to light.  Neck: Normal range of motion. Neck supple.  Cardiovascular: Normal rate and regular rhythm.   Pulmonary/Chest: Effort normal and breath sounds normal.  Abdominal: Soft. Bowel sounds are normal.  Musculoskeletal: Normal range of  motion.  Neurological: He is alert and oriented to person, place, and time.  Skin: Skin is warm and dry.  Staple line on left forearm is intact, non tender; one drop of blood on suture line; NVI  Psychiatric: He has a normal mood and affect. His behavior is normal.  Nursing note and vitals reviewed.   ED Course  Procedures (including critical care time) DIAGNOSTIC STUDIES: Oxygen Saturation is 100% on RA, normal by my interpretation.    COORDINATION OF CARE: 9:30 PM-Discussed treatment plan which includes pressure dressing of area, RICE techniques with pt at bedside and pt agreed to plan.   Labs Review Labs Reviewed - No data to  display  Imaging Review No results found.   EKG Interpretation None      MDM   Final diagnoses:  ESRD (end stage renal disease)   Left forearm examined. Staple line is secure. One drop of blood noted. Good hemostasis.  I personally performed the services described in this documentation, which was scribed in my presence. The recorded information has been reviewed and is accurate.      Nat Christen, MD 02/21/15 2142

## 2015-02-21 NOTE — ED Notes (Signed)
Moderate pressure dsg applied to pt's fistula site on L. Arm per orders of Dr. Lacinda Axon.

## 2015-02-22 ENCOUNTER — Encounter (HOSPITAL_COMMUNITY): Payer: Self-pay | Admitting: Vascular Surgery

## 2015-02-22 ENCOUNTER — Telehealth: Payer: Self-pay | Admitting: Vascular Surgery

## 2015-02-22 NOTE — Telephone Encounter (Signed)
Unable to reach pt, mailed letter, dpm

## 2015-02-22 NOTE — Telephone Encounter (Signed)
-----   Message from Denman George, RN sent at 02/19/2015  3:52 PM EDT ----- Regarding: Zigmund Daniel log; also needs 2 wk. f/u for suture removal with Dr. Scot Dock    ----- Message -----    From: Angelia Mould, MD    Sent: 02/19/2015  12:54 PM      To: [facility] Charge Pool Subject: charge                                         PROCEDURE: evacuation of lymphoceles of left AV graft and ligation of left forearm AV graft  SURGEON: Judeth Cornfield. Scot Dock, MD, FACS  ASSIST: Delena Serve, RNFA He will need a follow up visit in 2 weeks for suture removal. Thank you. CD

## 2015-02-24 ENCOUNTER — Ambulatory Visit: Payer: Medicare HMO | Admitting: Vascular Surgery

## 2015-03-08 ENCOUNTER — Encounter: Payer: Self-pay | Admitting: Vascular Surgery

## 2015-03-10 ENCOUNTER — Encounter: Payer: Medicare HMO | Admitting: Vascular Surgery

## 2015-03-17 ENCOUNTER — Encounter: Payer: Self-pay | Admitting: Vascular Surgery

## 2015-03-17 ENCOUNTER — Ambulatory Visit (INDEPENDENT_AMBULATORY_CARE_PROVIDER_SITE_OTHER): Payer: Self-pay | Admitting: Vascular Surgery

## 2015-03-17 VITALS — BP 181/112 | HR 63 | Temp 98.2°F | Ht 69.0 in | Wt 202.0 lb

## 2015-03-17 DIAGNOSIS — N186 End stage renal disease: Secondary | ICD-10-CM

## 2015-03-17 NOTE — Progress Notes (Signed)
Patient name: KEVION FATHEREE MRN: [id number] DOB: [birth year] Sex: male  REASON FOR VISIT: Follow up after evacuation of lymphoceles of left AV graft and ligation of left forearm AV graft.  HPI: LEANARD DIMAIO is a 64 y.o. 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 Graft ligation and evacuation of the lymphoceles was recommended. He was taken to the operating room on 02/19/2015 and the arterial limb of the graft was oversewn leaving only a small stump of the graft. The venous limb was divided and a large lymphocele was evacuated. He comes in for a follow up visit. He dialyzes on Tuesdays Thursdays and Saturdays. He has a functioning tunneled dialysis catheter. He tells me that he is considering signing the form which states that he will continue to use a catheter for now and not have new access placed.  He denies fever or chills.  Current Outpatient Prescriptions  Medication Sig Dispense Refill  . allopurinol (ZYLOPRIM) 100 MG tablet Take 100 mg by mouth every evening.     Marland Kitchen amLODipine (NORVASC) 10 MG tablet Take 10 mg by mouth every evening.     Marland Kitchen b complex-vitamin c-folic acid (NEPHRO-VITE) 0.8 MG TABS Take 0.8 mg by mouth every morning.     . calcitRIOL (ROCALTROL) 0.5 MCG capsule Take 1 capsule by mouth daily.    . calcium carbonate (TUMS - DOSED IN MG ELEMENTAL CALCIUM) 500 MG chewable tablet Chew 1-4 tablets by mouth 3 (three) times daily with meals. Take 4 tablets with largest meal of the day and 1-2 with smaller meals and snacks    . cloNIDine (CATAPRES) 0.3 MG tablet Take 0.3 mg by mouth 2 (two) times daily.    . metoprolol (LOPRESSOR) 50 MG tablet Take 50 mg by mouth 2 (two) times daily.    . naproxen sodium (ALEVE) 220 MG tablet Take 220 mg by mouth 2 (two) times daily as needed (Pain).    Marland Kitchen oxyCODONE (ROXICODONE) 5 MG immediate release tablet Take 1 tablet (5 mg total) by mouth every 6 (six) hours as needed for severe pain. 30 tablet 0    . triamcinolone cream (KENALOG) 0.1 % Apply 1 application topically daily as needed (for itching).      No current facility-administered medications for this visit.   REVIEW OF SYSTEMS: Valu.Nieves ] denotes positive finding; [  ] denotes negative finding  CARDIOVASCULAR:  [ ]  chest pain   [ ]  dyspnea on exertion    CONSTITUTIONAL:  [ ]  fever   [ ]  chills  PHYSICAL EXAM: Filed Vitals:   03/17/15 1440 03/17/15 1442  BP: 185/109 181/112  Pulse: 63   Temp: 98.2 F (36.8 C)   TempSrc: Oral   Height: 5\' 9"  (1.753 m)   Weight: 202 lb (91.627 kg)   SpO2: 98%    GENERAL: The patient is a well-nourished male, in no acute distress. The vital signs are documented above. CARDIOVASCULAR: There is a regular rate and rhythm. PULMONARY: There is good air exchange bilaterally without wheezing or rales. His incisions are healing nicely and I removed his sutures in the office today.  MEDICAL ISSUES:  STAGE V CHRONIC KIDNEY DISEASE: The patient is doing well status post ligation of his left forearm AV graft for recurrent lymphoceles. His next logical spot for access would be his left upper arm. Currently however, the patient is reluctant to proceed with placement of new access and is considering signing the form at the  dialysis Center stating that he wishes to continue to use his catheter for now despite the risks involved. When he decides to proceed with new access we would be happy to schedule him for placement of a new left upper arm AV graft. He dialyzes on Tuesdays Thursdays and Saturdays and we would arrange this on a nondialysis day.  [name] [facility]: [phone]

## 2015-03-31 ENCOUNTER — Inpatient Hospital Stay (HOSPITAL_COMMUNITY): Payer: Medicare HMO

## 2015-03-31 ENCOUNTER — Inpatient Hospital Stay (HOSPITAL_COMMUNITY)
Admission: EM | Admit: 2015-03-31 | Discharge: 2015-05-08 | DRG: 216 | Disposition: E | Payer: Medicare HMO | Attending: Surgery | Admitting: Surgery

## 2015-03-31 ENCOUNTER — Emergency Department (HOSPITAL_COMMUNITY): Payer: Medicare HMO

## 2015-03-31 ENCOUNTER — Encounter (HOSPITAL_COMMUNITY): Admission: EM | Disposition: E | Payer: Medicare HMO | Source: Home / Self Care | Attending: Surgery

## 2015-03-31 ENCOUNTER — Encounter (HOSPITAL_COMMUNITY): Payer: Self-pay

## 2015-03-31 DIAGNOSIS — Z89021 Acquired absence of right finger(s): Secondary | ICD-10-CM | POA: Diagnosis not present

## 2015-03-31 DIAGNOSIS — I2102 ST elevation (STEMI) myocardial infarction involving left anterior descending coronary artery: Secondary | ICD-10-CM

## 2015-03-31 DIAGNOSIS — R079 Chest pain, unspecified: Secondary | ICD-10-CM

## 2015-03-31 DIAGNOSIS — E872 Acidosis: Secondary | ICD-10-CM | POA: Diagnosis not present

## 2015-03-31 DIAGNOSIS — J189 Pneumonia, unspecified organism: Secondary | ICD-10-CM | POA: Diagnosis not present

## 2015-03-31 DIAGNOSIS — R4182 Altered mental status, unspecified: Secondary | ICD-10-CM

## 2015-03-31 DIAGNOSIS — R6521 Severe sepsis with septic shock: Secondary | ICD-10-CM | POA: Diagnosis not present

## 2015-03-31 DIAGNOSIS — I7101 Dissection of thoracic aorta: Secondary | ICD-10-CM | POA: Diagnosis not present

## 2015-03-31 DIAGNOSIS — I469 Cardiac arrest, cause unspecified: Secondary | ICD-10-CM | POA: Diagnosis not present

## 2015-03-31 DIAGNOSIS — M109 Gout, unspecified: Secondary | ICD-10-CM | POA: Diagnosis present

## 2015-03-31 DIAGNOSIS — I4891 Unspecified atrial fibrillation: Secondary | ICD-10-CM | POA: Diagnosis not present

## 2015-03-31 DIAGNOSIS — Z7982 Long term (current) use of aspirin: Secondary | ICD-10-CM | POA: Diagnosis not present

## 2015-03-31 DIAGNOSIS — I120 Hypertensive chronic kidney disease with stage 5 chronic kidney disease or end stage renal disease: Secondary | ICD-10-CM | POA: Diagnosis present

## 2015-03-31 DIAGNOSIS — Z66 Do not resuscitate: Secondary | ICD-10-CM | POA: Diagnosis not present

## 2015-03-31 DIAGNOSIS — R5381 Other malaise: Secondary | ICD-10-CM | POA: Diagnosis not present

## 2015-03-31 DIAGNOSIS — E875 Hyperkalemia: Secondary | ICD-10-CM | POA: Diagnosis not present

## 2015-03-31 DIAGNOSIS — E162 Hypoglycemia, unspecified: Secondary | ICD-10-CM | POA: Diagnosis not present

## 2015-03-31 DIAGNOSIS — I7100 Dissection of unspecified site of aorta: Secondary | ICD-10-CM

## 2015-03-31 DIAGNOSIS — Z992 Dependence on renal dialysis: Secondary | ICD-10-CM

## 2015-03-31 DIAGNOSIS — E669 Obesity, unspecified: Secondary | ICD-10-CM | POA: Diagnosis present

## 2015-03-31 DIAGNOSIS — A419 Sepsis, unspecified organism: Secondary | ICD-10-CM

## 2015-03-31 DIAGNOSIS — M898X9 Other specified disorders of bone, unspecified site: Secondary | ICD-10-CM | POA: Diagnosis present

## 2015-03-31 DIAGNOSIS — R579 Shock, unspecified: Secondary | ICD-10-CM | POA: Diagnosis not present

## 2015-03-31 DIAGNOSIS — I82621 Acute embolism and thrombosis of deep veins of right upper extremity: Secondary | ICD-10-CM | POA: Diagnosis not present

## 2015-03-31 DIAGNOSIS — K7200 Acute and subacute hepatic failure without coma: Secondary | ICD-10-CM | POA: Diagnosis not present

## 2015-03-31 DIAGNOSIS — Z96643 Presence of artificial hip joint, bilateral: Secondary | ICD-10-CM | POA: Diagnosis present

## 2015-03-31 DIAGNOSIS — N2581 Secondary hyperparathyroidism of renal origin: Secondary | ICD-10-CM | POA: Diagnosis present

## 2015-03-31 DIAGNOSIS — R197 Diarrhea, unspecified: Secondary | ICD-10-CM | POA: Diagnosis not present

## 2015-03-31 DIAGNOSIS — N185 Chronic kidney disease, stage 5: Secondary | ICD-10-CM

## 2015-03-31 DIAGNOSIS — R001 Bradycardia, unspecified: Secondary | ICD-10-CM | POA: Diagnosis not present

## 2015-03-31 DIAGNOSIS — D62 Acute posthemorrhagic anemia: Secondary | ICD-10-CM | POA: Diagnosis not present

## 2015-03-31 DIAGNOSIS — Y95 Nosocomial condition: Secondary | ICD-10-CM | POA: Diagnosis not present

## 2015-03-31 DIAGNOSIS — D631 Anemia in chronic kidney disease: Secondary | ICD-10-CM | POA: Diagnosis present

## 2015-03-31 DIAGNOSIS — Z9289 Personal history of other medical treatment: Secondary | ICD-10-CM

## 2015-03-31 DIAGNOSIS — Z87891 Personal history of nicotine dependence: Secondary | ICD-10-CM | POA: Diagnosis not present

## 2015-03-31 DIAGNOSIS — G4733 Obstructive sleep apnea (adult) (pediatric): Secondary | ICD-10-CM | POA: Diagnosis present

## 2015-03-31 DIAGNOSIS — G9340 Encephalopathy, unspecified: Secondary | ICD-10-CM | POA: Diagnosis not present

## 2015-03-31 DIAGNOSIS — R578 Other shock: Secondary | ICD-10-CM | POA: Diagnosis not present

## 2015-03-31 DIAGNOSIS — I4901 Ventricular fibrillation: Secondary | ICD-10-CM | POA: Diagnosis not present

## 2015-03-31 DIAGNOSIS — R791 Abnormal coagulation profile: Secondary | ICD-10-CM | POA: Diagnosis not present

## 2015-03-31 DIAGNOSIS — J9601 Acute respiratory failure with hypoxia: Secondary | ICD-10-CM | POA: Diagnosis not present

## 2015-03-31 DIAGNOSIS — J939 Pneumothorax, unspecified: Secondary | ICD-10-CM

## 2015-03-31 DIAGNOSIS — I2510 Atherosclerotic heart disease of native coronary artery without angina pectoris: Secondary | ICD-10-CM

## 2015-03-31 DIAGNOSIS — Z6829 Body mass index (BMI) 29.0-29.9, adult: Secondary | ICD-10-CM

## 2015-03-31 DIAGNOSIS — N179 Acute kidney failure, unspecified: Secondary | ICD-10-CM | POA: Diagnosis not present

## 2015-03-31 DIAGNOSIS — R072 Precordial pain: Secondary | ICD-10-CM | POA: Insufficient documentation

## 2015-03-31 DIAGNOSIS — H5702 Anisocoria: Secondary | ICD-10-CM

## 2015-03-31 DIAGNOSIS — Z452 Encounter for adjustment and management of vascular access device: Secondary | ICD-10-CM

## 2015-03-31 DIAGNOSIS — I169 Hypertensive crisis, unspecified: Secondary | ICD-10-CM | POA: Insufficient documentation

## 2015-03-31 DIAGNOSIS — Z9119 Patient's noncompliance with other medical treatment and regimen: Secondary | ICD-10-CM | POA: Diagnosis not present

## 2015-03-31 DIAGNOSIS — N186 End stage renal disease: Secondary | ICD-10-CM | POA: Diagnosis present

## 2015-03-31 DIAGNOSIS — J9811 Atelectasis: Secondary | ICD-10-CM

## 2015-03-31 DIAGNOSIS — R570 Cardiogenic shock: Secondary | ICD-10-CM | POA: Diagnosis not present

## 2015-03-31 DIAGNOSIS — I10 Essential (primary) hypertension: Secondary | ICD-10-CM | POA: Diagnosis not present

## 2015-03-31 DIAGNOSIS — R609 Edema, unspecified: Secondary | ICD-10-CM

## 2015-03-31 DIAGNOSIS — J9690 Respiratory failure, unspecified, unspecified whether with hypoxia or hypercapnia: Secondary | ICD-10-CM

## 2015-03-31 DIAGNOSIS — R0902 Hypoxemia: Secondary | ICD-10-CM

## 2015-03-31 DIAGNOSIS — Z9889 Other specified postprocedural states: Secondary | ICD-10-CM

## 2015-03-31 HISTORY — PX: ASCENDING AORTIC ROOT REPLACEMENT: SHX5729

## 2015-03-31 HISTORY — PX: CARDIAC CATHETERIZATION: SHX172

## 2015-03-31 LAB — BASIC METABOLIC PANEL
Anion gap: 13 (ref 5–15)
BUN: 29 mg/dL — ABNORMAL HIGH (ref 6–20)
CALCIUM: 9.1 mg/dL (ref 8.9–10.3)
CHLORIDE: 101 mmol/L (ref 101–111)
CO2: 26 mmol/L (ref 22–32)
CREATININE: 9.83 mg/dL — AB (ref 0.61–1.24)
GFR calc non Af Amer: 5 mL/min — ABNORMAL LOW (ref >60)
GFR, EST AFRICAN AMERICAN: 6 mL/min — AB (ref >60)
Glucose, Bld: 105 mg/dL — ABNORMAL HIGH (ref 65–99)
Potassium: 3.7 mmol/L (ref 3.5–5.1)
Sodium: 140 mmol/L (ref 135–145)

## 2015-03-31 LAB — BLOOD GAS, ARTERIAL
Acid-Base Excess: 0.3 mmol/L (ref 0.0–2.0)
BICARBONATE: 24.8 meq/L — AB (ref 20.0–24.0)
Drawn by: 398981
FIO2: 0.21
O2 SAT: 91.8 %
PATIENT TEMPERATURE: 98.6
PH ART: 7.384 (ref 7.350–7.450)
PO2 ART: 70.6 mmHg — AB (ref 80.0–100.0)
TCO2: 26.1 mmol/L (ref 0–100)
pCO2 arterial: 42.4 mmHg (ref 35.0–45.0)

## 2015-03-31 LAB — TROPONIN I: Troponin I: 0.15 ng/mL — ABNORMAL HIGH (ref <0.031)

## 2015-03-31 LAB — CBC
HCT: 35.5 % — ABNORMAL LOW (ref 39.0–52.0)
Hemoglobin: 11.4 g/dL — ABNORMAL LOW (ref 13.0–17.0)
MCH: 26.1 pg (ref 26.0–34.0)
MCHC: 32.1 g/dL (ref 30.0–36.0)
MCV: 81.4 fL (ref 78.0–100.0)
PLATELETS: 148 10*3/uL — AB (ref 150–400)
RBC: 4.36 MIL/uL (ref 4.22–5.81)
RDW: 17.8 % — ABNORMAL HIGH (ref 11.5–15.5)
WBC: 10.7 10*3/uL — ABNORMAL HIGH (ref 4.0–10.5)

## 2015-03-31 LAB — I-STAT TROPONIN, ED: TROPONIN I, POC: 0.09 ng/mL — AB (ref 0.00–0.08)

## 2015-03-31 LAB — MRSA PCR SCREENING: MRSA by PCR: NEGATIVE

## 2015-03-31 SURGERY — ASCENDING AORTIC ROOT REPLACEMENT
Anesthesia: General | Site: Chest

## 2015-03-31 SURGERY — LEFT HEART CATH AND CORONARY ANGIOGRAPHY
Anesthesia: LOCAL

## 2015-03-31 MED ORDER — SODIUM CHLORIDE 0.9 % IV SOLN
INTRAVENOUS | Status: DC
  Filled 2015-03-31: qty 30

## 2015-03-31 MED ORDER — MORPHINE SULFATE (PF) 2 MG/ML IV SOLN
INTRAVENOUS | Status: AC
  Filled 2015-03-31: qty 1

## 2015-03-31 MED ORDER — PROPOFOL 10 MG/ML IV BOLUS
INTRAVENOUS | Status: AC
  Filled 2015-03-31: qty 20

## 2015-03-31 MED ORDER — SODIUM CHLORIDE 0.9 % IV SOLN
INTRAVENOUS | Status: AC
  Administered 2015-04-01: 1.1 [IU] via INTRAVENOUS
  Administered 2015-04-01 (×2): 1.3 [IU] via INTRAVENOUS
  Filled 2015-03-31: qty 2.5

## 2015-03-31 MED ORDER — CHLORHEXIDINE GLUCONATE CLOTH 2 % EX PADS: 6.0000 | MEDICATED_PAD | Freq: Once | CUTANEOUS | Status: DC

## 2015-03-31 MED ORDER — MIDAZOLAM HCL 10 MG/2ML IJ SOLN
INTRAMUSCULAR | Status: AC
  Filled 2015-03-31: qty 4

## 2015-03-31 MED ORDER — BIVALIRUDIN 250 MG IV SOLR
INTRAVENOUS | Status: AC
  Filled 2015-03-31: qty 250

## 2015-03-31 MED ORDER — SODIUM CHLORIDE 0.9 % IV SOLN
INTRAVENOUS | Status: DC | PRN
  Administered 2015-03-31: 10 mL/h via INTRAVENOUS

## 2015-03-31 MED ORDER — CEFUROXIME SODIUM 750 MG IJ SOLR
750.0000 mg | INTRAMUSCULAR | Status: DC
  Filled 2015-03-31: qty 750

## 2015-03-31 MED ORDER — FENTANYL CITRATE (PF) 100 MCG/2ML IJ SOLN
75.0000 ug | Freq: Once | INTRAMUSCULAR | Status: AC
  Administered 2015-03-31: 75 ug via INTRAVENOUS
  Filled 2015-03-31: qty 2

## 2015-03-31 MED ORDER — CHLORHEXIDINE GLUCONATE 4 % EX LIQD
CUTANEOUS | Status: AC
  Filled 2015-03-31: qty 15

## 2015-03-31 MED ORDER — SODIUM CHLORIDE 0.9 % IJ SOLN: 3.0000 mL | Freq: Two times a day (BID) | INTRAMUSCULAR | Status: DC

## 2015-03-31 MED ORDER — SODIUM CHLORIDE 0.9 % IJ SOLN: 3.0000 mL | INTRAMUSCULAR | Status: DC | PRN

## 2015-03-31 MED ORDER — CALCIUM CARBONATE ANTACID 500 MG PO CHEW
1.0000 | CHEWABLE_TABLET | Freq: Three times a day (TID) | ORAL | Status: DC
  Filled 2015-03-31 (×3): qty 4

## 2015-03-31 MED ORDER — METOPROLOL TARTRATE 12.5 MG HALF TABLET: 12.5000 mg | ORAL_TABLET | Freq: Once | ORAL | Status: DC

## 2015-03-31 MED ORDER — AMLODIPINE BESYLATE 5 MG PO TABS: 10.0000 mg | ORAL_TABLET | Freq: Every evening | ORAL | Status: DC

## 2015-03-31 MED ORDER — METOPROLOL TARTRATE 50 MG PO TABS: 50.0000 mg | ORAL_TABLET | Freq: Two times a day (BID) | ORAL | Status: DC

## 2015-03-31 MED ORDER — CALCITRIOL 0.5 MCG PO CAPS
0.5000 ug | ORAL_CAPSULE | Freq: Every day | ORAL | Status: DC
  Administered 2015-04-03 – 2015-04-10 (×5): 0.5 ug via ORAL
  Filled 2015-03-31 (×10): qty 1

## 2015-03-31 MED ORDER — LIDOCAINE HCL (PF) 1 % IJ SOLN
INTRAMUSCULAR | Status: DC | PRN
  Administered 2015-03-31: 19:00:00

## 2015-03-31 MED ORDER — NITROGLYCERIN IN D5W 200-5 MCG/ML-% IV SOLN
INTRAVENOUS | Status: DC | PRN
  Administered 2015-03-31: 20 ug/min via INTRAVENOUS

## 2015-03-31 MED ORDER — NITROPRUSSIDE SODIUM 25 MG/ML IV SOLN
0.0000 ug/kg/min | INTRAVENOUS | Status: DC
  Administered 2015-03-31: 0.3 ug/kg/min via INTRAVENOUS
  Administered 2015-04-01: 1 ug via INTRAVENOUS
  Filled 2015-03-31: qty 2

## 2015-03-31 MED ORDER — MORPHINE SULFATE (PF) 2 MG/ML IV SOLN
2.0000 mg | INTRAVENOUS | Status: DC | PRN
  Administered 2015-03-31: 2 mg via INTRAVENOUS

## 2015-03-31 MED ORDER — TEMAZEPAM 15 MG PO CAPS: 15.0000 mg | ORAL_CAPSULE | Freq: Once | ORAL | Status: DC | PRN

## 2015-03-31 MED ORDER — IOHEXOL 350 MG/ML SOLN
100.0000 mL | Freq: Once | INTRAVENOUS | Status: AC | PRN
  Administered 2015-03-31: 100 mL via INTRAVENOUS

## 2015-03-31 MED ORDER — LIDOCAINE HCL (PF) 1 % IJ SOLN
INTRAMUSCULAR | Status: AC
  Filled 2015-03-31: qty 30

## 2015-03-31 MED ORDER — MIDAZOLAM HCL 2 MG/2ML IJ SOLN
INTRAMUSCULAR | Status: AC
  Filled 2015-03-31: qty 4

## 2015-03-31 MED ORDER — PLASMA-LYTE 148 IV SOLN
INTRAVENOUS | Status: DC
  Filled 2015-03-31: qty 2.5

## 2015-03-31 MED ORDER — FENTANYL CITRATE (PF) 250 MCG/5ML IJ SOLN
INTRAMUSCULAR | Status: AC
  Filled 2015-03-31: qty 5

## 2015-03-31 MED ORDER — HYDRALAZINE HCL 20 MG/ML IJ SOLN
INTRAMUSCULAR | Status: AC
  Filled 2015-03-31: qty 1

## 2015-03-31 MED ORDER — SODIUM CHLORIDE 0.9 % IV SOLN
250.0000 mL | INTRAVENOUS | Status: DC | PRN
  Administered 2015-04-01: via INTRAVENOUS

## 2015-03-31 MED ORDER — ACETAMINOPHEN 325 MG PO TABS: 650.0000 mg | ORAL_TABLET | ORAL | Status: DC | PRN

## 2015-03-31 MED ORDER — NITROGLYCERIN IN D5W 200-5 MCG/ML-% IV SOLN
2.0000 ug/min | INTRAVENOUS | Status: AC
  Administered 2015-04-01: 10 ug/min via INTRAVENOUS
  Filled 2015-03-31: qty 250

## 2015-03-31 MED ORDER — VANCOMYCIN HCL 10 G IV SOLR
1250.0000 mg | INTRAVENOUS | Status: AC
  Administered 2015-04-01: 1250 mg via INTRAVENOUS
  Filled 2015-03-31: qty 1250

## 2015-03-31 MED ORDER — CLONIDINE HCL 0.1 MG PO TABS: 0.3000 mg | ORAL_TABLET | Freq: Two times a day (BID) | ORAL | Status: DC

## 2015-03-31 MED ORDER — LIDOCAINE HCL (PF) 1 % IJ SOLN
INTRAMUSCULAR | Status: DC | PRN
  Administered 2015-03-31: 15 mL

## 2015-03-31 MED ORDER — RENA-VITE PO TABS
1.0000 | ORAL_TABLET | Freq: Every day | ORAL | Status: DC
  Administered 2015-04-02 – 2015-04-10 (×5): 1 via ORAL
  Filled 2015-03-31 (×13): qty 1

## 2015-03-31 MED ORDER — SODIUM CHLORIDE 0.9 % IV SOLN
INTRAVENOUS | Status: AC
  Administered 2015-04-01: 1 mL via INTRAVENOUS
  Administered 2015-04-01: 69 mL via INTRAVENOUS
  Filled 2015-03-31: qty 40

## 2015-03-31 MED ORDER — EPINEPHRINE HCL 1 MG/ML IJ SOLN
0.0000 ug/min | INTRAVENOUS | Status: DC
  Filled 2015-03-31: qty 4

## 2015-03-31 MED ORDER — FENTANYL CITRATE (PF) 100 MCG/2ML IJ SOLN
50.0000 ug | INTRAMUSCULAR | Status: DC | PRN
  Administered 2015-03-31 – 2015-04-01 (×4): 50 ug via INTRAVENOUS
  Administered 2015-04-01 (×2): 100 ug via INTRAVENOUS
  Administered 2015-04-01: 150 ug via INTRAVENOUS
  Administered 2015-04-01: 200 ug via INTRAVENOUS
  Administered 2015-04-01: 350 ug via INTRAVENOUS
  Administered 2015-04-01: 100 ug via INTRAVENOUS
  Administered 2015-04-01: 50 ug via INTRAVENOUS
  Administered 2015-04-01: 250 ug via INTRAVENOUS
  Administered 2015-04-01: 100 ug via INTRAVENOUS
  Filled 2015-03-31 (×2): qty 2

## 2015-03-31 MED ORDER — NITROGLYCERIN IN D5W 200-5 MCG/ML-% IV SOLN
0.0000 ug/min | INTRAVENOUS | Status: DC
  Administered 2015-03-31: 40 ug/min via INTRAVENOUS

## 2015-03-31 MED ORDER — HEPARIN (PORCINE) IN NACL 100-0.45 UNIT/ML-% IJ SOLN
1000.0000 [IU]/h | Freq: Once | INTRAMUSCULAR | Status: AC
  Administered 2015-03-31: 1000 [IU]/h via INTRAVENOUS

## 2015-03-31 MED ORDER — HEPARIN SODIUM (PORCINE) 5000 UNIT/ML IJ SOLN
4000.0000 [IU] | Freq: Once | INTRAMUSCULAR | Status: AC
  Administered 2015-03-31: 4000 [IU] via INTRAVENOUS

## 2015-03-31 MED ORDER — HYDRALAZINE HCL 20 MG/ML IJ SOLN
INTRAMUSCULAR | Status: DC | PRN
  Administered 2015-03-31: 20 mg via INTRAVENOUS

## 2015-03-31 MED ORDER — SODIUM CHLORIDE 0.9 % IV SOLN: 250.0000 mL | INTRAVENOUS | Status: DC | PRN

## 2015-03-31 MED ORDER — ALLOPURINOL 100 MG PO TABS
100.0000 mg | ORAL_TABLET | Freq: Every evening | ORAL | Status: DC
  Administered 2015-04-01 – 2015-04-04 (×4): 100 mg via ORAL
  Filled 2015-03-31 (×4): qty 1

## 2015-03-31 MED ORDER — ONDANSETRON HCL 4 MG/2ML IJ SOLN: 4.0000 mg | Freq: Four times a day (QID) | INTRAMUSCULAR | Status: DC | PRN

## 2015-03-31 MED ORDER — MAGNESIUM SULFATE 50 % IJ SOLN
40.0000 meq | INTRAMUSCULAR | Status: DC
  Filled 2015-03-31: qty 10

## 2015-03-31 MED ORDER — DEXTROSE 5 % IV SOLN
30.0000 ug/min | INTRAVENOUS | Status: AC
  Administered 2015-04-01: 15 ug/min via INTRAVENOUS
  Filled 2015-03-31: qty 2

## 2015-03-31 MED ORDER — MIDAZOLAM HCL 2 MG/2ML IJ SOLN
INTRAMUSCULAR | Status: DC | PRN
  Administered 2015-03-31: 2 mg via INTRAVENOUS

## 2015-03-31 MED ORDER — ONDANSETRON HCL 4 MG/2ML IJ SOLN
4.0000 mg | Freq: Once | INTRAMUSCULAR | Status: AC
  Administered 2015-03-31: 4 mg via INTRAVENOUS
  Filled 2015-03-31: qty 2

## 2015-03-31 MED ORDER — HEPARIN (PORCINE) IN NACL 100-0.45 UNIT/ML-% IJ SOLN
INTRAMUSCULAR | Status: AC
  Filled 2015-03-31: qty 250

## 2015-03-31 MED ORDER — IOHEXOL 350 MG/ML SOLN
INTRAVENOUS | Status: DC | PRN
  Administered 2015-03-31: 100 mL via INTRAVENOUS

## 2015-03-31 MED ORDER — POTASSIUM CHLORIDE 2 MEQ/ML IV SOLN
80.0000 meq | INTRAVENOUS | Status: DC
  Filled 2015-03-31: qty 40

## 2015-03-31 MED ORDER — ASPIRIN 81 MG PO CHEW
324.0000 mg | CHEWABLE_TABLET | Freq: Once | ORAL | Status: AC
  Administered 2015-03-31: 324 mg via ORAL
  Filled 2015-03-31: qty 4

## 2015-03-31 MED ORDER — DEXTROSE 5 % IV SOLN
1.5000 g | INTRAVENOUS | Status: DC
  Filled 2015-03-31: qty 1.5

## 2015-03-31 MED ORDER — BISACODYL 5 MG PO TBEC: 5.0000 mg | DELAYED_RELEASE_TABLET | Freq: Once | ORAL | Status: DC

## 2015-03-31 MED ORDER — DEXMEDETOMIDINE HCL IN NACL 400 MCG/100ML IV SOLN
0.1000 ug/kg/h | INTRAVENOUS | Status: AC
  Administered 2015-04-01: .2 ug/kg/h via INTRAVENOUS
  Filled 2015-03-31: qty 100

## 2015-03-31 MED ORDER — NITROGLYCERIN IN D5W 200-5 MCG/ML-% IV SOLN
5.0000 ug/min | Freq: Once | INTRAVENOUS | Status: AC
  Administered 2015-03-31: 5 ug/min via INTRAVENOUS
  Filled 2015-03-31: qty 250

## 2015-03-31 MED ORDER — DOPAMINE-DEXTROSE 3.2-5 MG/ML-% IV SOLN
0.0000 ug/kg/min | INTRAVENOUS | Status: DC
  Filled 2015-03-31: qty 250

## 2015-03-31 MED ORDER — FENTANYL CITRATE (PF) 100 MCG/2ML IJ SOLN
INTRAMUSCULAR | Status: AC
  Filled 2015-03-31: qty 2

## 2015-03-31 SURGICAL SUPPLY — 116 items
ADAPTER CARDIO PERF ANTE/RETRO (ADAPTER) ×3 IMPLANT
ADPR PRFSN 84XANTGRD RTRGD (ADAPTER) ×1
APL SRG 7X2 LUM MLBL SLNT (VASCULAR PRODUCTS) ×1
APPLICATOR TIP COSEAL (VASCULAR PRODUCTS) ×2 IMPLANT
ATTRACTOMAT 16X20 MAGNETIC DRP (DRAPES) ×3 IMPLANT
BAG DECANTER FOR FLEXI CONT (MISCELLANEOUS) ×1 IMPLANT
BLADE STERNUM SYSTEM 6 (BLADE) ×3 IMPLANT
BLADE SURG 15 STRL LF DISP TIS (BLADE) ×1 IMPLANT
BLADE SURG 15 STRL SS (BLADE) ×6
CANISTER SUCTION 2500CC (MISCELLANEOUS) ×3 IMPLANT
CANNULA AORTIC ROOT 9FR (CANNULA) ×2 IMPLANT
CANNULA GUNDRY RCSP 15FR (MISCELLANEOUS) ×3 IMPLANT
CATH HEART VENT LEFT (CATHETERS) IMPLANT
CATH RETROPLEGIA CORONARY 14FR (CATHETERS) ×2 IMPLANT
CATH ROBINSON RED A/P 18FR (CATHETERS) ×11 IMPLANT
CATH THORACIC 36FR (CATHETERS) ×3 IMPLANT
CATH THORACIC 36FR RT ANG (CATHETERS) ×3 IMPLANT
CAUTERY HIGH TEMP VAS (MISCELLANEOUS) ×3 IMPLANT
CONN ST 1/4X3/8  BEN (MISCELLANEOUS) ×2
CONN ST 1/4X3/8 BEN (MISCELLANEOUS) IMPLANT
CONN Y 3/8X3/8X3/8  BEN (MISCELLANEOUS) ×2
CONN Y 3/8X3/8X3/8 BEN (MISCELLANEOUS) IMPLANT
CONT SPEC 4OZ CLIKSEAL STRL BL (MISCELLANEOUS) ×2 IMPLANT
CONT SPEC STER OR (MISCELLANEOUS) ×3 IMPLANT
COVER SURGICAL LIGHT HANDLE (MISCELLANEOUS) ×4 IMPLANT
CRADLE DONUT ADULT HEAD (MISCELLANEOUS) ×1 IMPLANT
DRAPE SLUSH/WARMER DISC (DRAPES) ×2 IMPLANT
DRSG COVADERM 4X14 (GAUZE/BANDAGES/DRESSINGS) ×3 IMPLANT
ELECT CAUTERY BLADE 6.4 (BLADE) ×3 IMPLANT
ELECT REM PT RETURN 9FT ADLT (ELECTROSURGICAL) ×6
ELECTRODE REM PT RTRN 9FT ADLT (ELECTROSURGICAL) ×2 IMPLANT
GAUZE SPONGE 4X4 12PLY STRL (GAUZE/BANDAGES/DRESSINGS) ×3 IMPLANT
GLOVE BIO SURGEON STRL SZ 6 (GLOVE) ×8 IMPLANT
GLOVE BIO SURGEON STRL SZ 6.5 (GLOVE) IMPLANT
GLOVE BIO SURGEON STRL SZ7 (GLOVE) IMPLANT
GLOVE BIO SURGEON STRL SZ7.5 (GLOVE) IMPLANT
GLOVE BIO SURGEONS STRL SZ 6.5 (GLOVE)
GLOVE BIOGEL PI IND STRL 6 (GLOVE) IMPLANT
GLOVE BIOGEL PI IND STRL 7.0 (GLOVE) IMPLANT
GLOVE BIOGEL PI INDICATOR 6 (GLOVE) ×6
GLOVE BIOGEL PI INDICATOR 7.0 (GLOVE) ×6
GLOVE EUDERMIC 7 POWDERFREE (GLOVE) ×6 IMPLANT
GOWN STRL REUS W/ TWL LRG LVL3 (GOWN DISPOSABLE) ×4 IMPLANT
GOWN STRL REUS W/ TWL XL LVL3 (GOWN DISPOSABLE) ×1 IMPLANT
GOWN STRL REUS W/TWL LRG LVL3 (GOWN DISPOSABLE) ×18
GOWN STRL REUS W/TWL XL LVL3 (GOWN DISPOSABLE) ×3
GRAFT HEMASHIELD 30X10 (Vascular Products) ×2 IMPLANT
GRAFT VASC 8MMX60CM STRAIGHT (Prosthesis & Implant Heart) ×2 IMPLANT
HEART VENT LT CURVED (MISCELLANEOUS) ×3 IMPLANT
HEMOSTAT POWDER SURGIFOAM 1G (HEMOSTASIS) ×9 IMPLANT
HEMOSTAT SURGICEL 2X14 (HEMOSTASIS) ×3 IMPLANT
KIT BASIN OR (CUSTOM PROCEDURE TRAY) ×3 IMPLANT
KIT CATH CPB BARTLE (MISCELLANEOUS) ×3 IMPLANT
KIT ROOM TURNOVER OR (KITS) ×3 IMPLANT
KIT SUCTION CATH 14FR (SUCTIONS) ×5 IMPLANT
LIQUID BAND (GAUZE/BANDAGES/DRESSINGS) ×2 IMPLANT
LOOP VESSEL MAXI BLUE (MISCELLANEOUS) ×2 IMPLANT
LOOP VESSEL SUPERMAXI WHITE (MISCELLANEOUS) ×2 IMPLANT
NS IRRIG 1000ML POUR BTL (IV SOLUTION) ×17 IMPLANT
PACK OPEN HEART (CUSTOM PROCEDURE TRAY) ×3 IMPLANT
PAD ARMBOARD 7.5X6 YLW CONV (MISCELLANEOUS) ×10 IMPLANT
SEALANT SURG COSEAL 8ML (VASCULAR PRODUCTS) ×2 IMPLANT
SPONGE GAUZE 4X4 12PLY STER LF (GAUZE/BANDAGES/DRESSINGS) ×2 IMPLANT
SUT BONE WAX W31G (SUTURE) ×3 IMPLANT
SUT ETHIBON 2 0 V 52N 30 (SUTURE) ×6 IMPLANT
SUT ETHIBON EXCEL 2-0 V-5 (SUTURE) IMPLANT
SUT ETHIBOND 2 0 SH (SUTURE) ×18
SUT ETHIBOND 2 0 SH 36X2 (SUTURE) IMPLANT
SUT ETHIBOND V-5 VALVE (SUTURE) IMPLANT
SUT PROLENE 3 0 SH 1 (SUTURE) ×3 IMPLANT
SUT PROLENE 3 0 SH DA (SUTURE) ×4 IMPLANT
SUT PROLENE 3 0 SH1 36 (SUTURE) ×6 IMPLANT
SUT PROLENE 4 0 RB 1 (SUTURE) ×18
SUT PROLENE 4-0 RB1 .5 CRCL 36 (SUTURE) ×4 IMPLANT
SUT PROLENE 5 0 C 1 36 (SUTURE) ×6 IMPLANT
SUT PROLENE 5 0 RB 2 (SUTURE) ×6 IMPLANT
SUT PROLENE 6 0 C 1 30 (SUTURE) ×4 IMPLANT
SUT PROLENE 8 0 BV175 6 (SUTURE) ×4 IMPLANT
SUT SILK  1 MH (SUTURE) ×8
SUT SILK 1 MH (SUTURE) IMPLANT
SUT SILK 1 TIES 10X30 (SUTURE) ×2 IMPLANT
SUT SILK 2 0 SH CR/8 (SUTURE) ×4 IMPLANT
SUT SILK 2 0 TIES 10X30 (SUTURE) ×2 IMPLANT
SUT SILK 2 0 TIES 17X18 (SUTURE) ×3
SUT SILK 2-0 18XBRD TIE BLK (SUTURE) IMPLANT
SUT SILK 3 0 (SUTURE) ×3
SUT SILK 3 0 SH CR/8 (SUTURE) ×2 IMPLANT
SUT SILK 3-0 18XBRD TIE 12 (SUTURE) IMPLANT
SUT SILK 4 0 TIE 10X30 (SUTURE) ×4 IMPLANT
SUT STEEL 6MS V (SUTURE) IMPLANT
SUT STEEL STERNAL CCS#1 18IN (SUTURE) IMPLANT
SUT STEEL SZ 6 DBL 3X14 BALL (SUTURE) IMPLANT
SUT TEM PAC WIRE 2 0 SH (SUTURE) ×8 IMPLANT
SUT VIC AB 1 CTX 36 (SUTURE) ×9
SUT VIC AB 1 CTX36XBRD ANBCTR (SUTURE) ×2 IMPLANT
SUT VIC AB 2-0 CT1 27 (SUTURE)
SUT VIC AB 2-0 CT1 36 (SUTURE) ×2 IMPLANT
SUT VIC AB 2-0 CT1 TAPERPNT 27 (SUTURE) IMPLANT
SUT VIC AB 2-0 CTX 27 (SUTURE) ×4 IMPLANT
SUT VIC AB 3-0 SH 27 (SUTURE) ×3
SUT VIC AB 3-0 SH 27X BRD (SUTURE) IMPLANT
SUT VIC AB 3-0 X1 27 (SUTURE) ×6 IMPLANT
SUTURE E-PAK OPEN HEART (SUTURE) ×1 IMPLANT
SYSTEM SAHARA CHEST DRAIN ATS (WOUND CARE) ×3 IMPLANT
TAPE CLOTH SURG 4X10 WHT LF (GAUZE/BANDAGES/DRESSINGS) ×4 IMPLANT
TOWEL OR 17X24 6PK STRL BLUE (TOWEL DISPOSABLE) ×3 IMPLANT
TOWEL OR 17X26 10 PK STRL BLUE (TOWEL DISPOSABLE) ×3 IMPLANT
TRAY FOLEY IC TEMP SENS 14FR (CATHETERS) ×1 IMPLANT
TUBE CONNECTING 12'X1/4 (SUCTIONS) ×1
TUBE CONNECTING 12X1/4 (SUCTIONS) ×1 IMPLANT
TUBE SUCT INTRACARD DLP 20F (MISCELLANEOUS) ×2 IMPLANT
TUBING ART PRESS 48 MALE/FEM (TUBING) ×2 IMPLANT
UNDERPAD 30X30 INCONTINENT (UNDERPADS AND DIAPERS) ×3 IMPLANT
VENT LEFT HEART 12002 (CATHETERS) ×3
WATER STERILE IRR 1000ML POUR (IV SOLUTION) ×6 IMPLANT
YANKAUER SUCT BULB TIP NO VENT (SUCTIONS) ×2 IMPLANT

## 2015-03-31 SURGICAL SUPPLY — 10 items
CATH INFINITI 5FR MULTPACK ANG (CATHETERS) ×1 IMPLANT
CATH VISTA GUIDE 6FR XBLAD3.5 (CATHETERS) ×1 IMPLANT
DEVICE WIRE ANGIOSEAL 6FR (Vascular Products) ×1 IMPLANT
KIT HEART LEFT (KITS) ×2 IMPLANT
PACK CARDIAC CATHETERIZATION (CUSTOM PROCEDURE TRAY) ×2 IMPLANT
SHEATH PINNACLE 6F 10CM (SHEATH) ×1 IMPLANT
SYR MEDRAD MARK V 150ML (SYRINGE) ×2 IMPLANT
TRANSDUCER W/STOPCOCK (MISCELLANEOUS) ×2 IMPLANT
TUBING CIL FLEX 10 FLL-RA (TUBING) ×2 IMPLANT
WIRE EMERALD 3MM-J .035X150CM (WIRE) ×1 IMPLANT

## 2015-03-31 NOTE — ED Provider Notes (Signed)
CSN: [id number]     Arrival date & time 03/16/2015  1657 History   First MD Initiated Contact with Patient 04/02/2015 1706     Chief Complaint  Patient presents with  . Chest Pain      HPI  CC:  Chest pain. Pt arrival during unplanned EPIC downtime causing delay in registration, placement into room, and   initial EKG. I was in the care of an acute CVA patient during patient's arrival, and was   thus delayed in patient's initial evaluation.  Patient presents with chief complaint of chest pain. He states this started approximately 15 minutes before his arrival. He estimates approximately 17:00 while he was cutting his yard with a push mower that he developed low substernal chest pain radiating directly to his midline back. He states he was nauseated, short of breath, and presents here immediately thereafter.  Denies any history of prior episodes of chest pain. He denies any history of known cardiac disease.  He has a history of end-stage renal disease secondary to hypertensive nephropathy. He is on 3 times weekly maintenance dialysis. Last dialyzed yesterday, Tuesday.  Denies any other recent episodes of pain or limitation of his exercise.  No history of GI bleeding. No recent procedures. He has a right subclavian temporary Port-A-Cath due to left upper extremity fistula failure.  Past Medical History  Diagnosis Date  . Hypertension     Echo in 10/2006-moderate LVH, normal EF  . Obesity   . Gout   . Gastroesophageal reflux disease   . Erectile dysfunction   . Prostatic hypertrophy, benign   . Allergic rhinitis   . Chronic kidney disease, stage 5, kidney failure      dialysis at [facility] T/TH/Sat; ESRD as of 05/2011; leg cramps; hypokalemia; microcytosis  . Secondary hyperparathyroidism of renal origin   . Anemia   . Dialysis patient   . History of blood transfusion   . Thrombocytopenia   . Elevated troponin   . Obstructive sleep apnea 2008    2008- uses CPAP  . Difficult  intubation     Spasms after LMA inserted -12/13/10   Past Surgical History  Procedure Laterality Date  . Total hip arthroplasty      bilateral  . Portacath placement    . Cholecystectomy    . Finger amputation      right middle  . Colonoscopy  07/12/2011    Procedure: COLONOSCOPY;  Surgeon: Dorothyann Peng, MD;  Location: [facility];  Service: Endoscopy;  Laterality: N/A;  1:00  . Av fistula placement  05-31-11    Right radiocephalic AVF  . Colostomy  Pt denies having this surgery - (12/17/14.    + subsequent takedown secondary to colonic rupture related to trauma  . Joint replacement      times 3  . Av fistula placement  04/01/2012    Procedure: INSERTION OF ARTERIOVENOUS (AV) GORE-TEX GRAFT ARM;  Surgeon: Elam Dutch, MD;  Location: [facility] OR;  Service: Vascular;  Laterality: Left;  . Revison of arteriovenous fistula Left 05/06/2014    Procedure: REVISON OF ARTERIOVENOUS GRAFT;  Surgeon: Conrad Montezuma, MD;  Location: [place];  Service: Vascular;  Laterality: Left;  . Shuntogram N/A 10/06/2011    Procedure: Earney Mallet;  Surgeon: Elam Dutch, MD;  Location: [facility] CATH LAB;  Service: Cardiovascular;  Laterality: N/A;  . Insertion of dialysis catheter Right 12/18/2014    Procedure: ULTRASOUND GUIDED INSERTION OF RIGHT INTERNAL JUGULAR DIALYSIS CATHETER;  Surgeon: Judeth Cornfield  Scot Dock, MD;  Location: [facility] OR;  Service: Vascular;  Laterality: Right;  . Hematoma evacuation Left 12/18/2014    Procedure: EVACUATION OF LEFT ARM LYMPHOCELE;  Surgeon: Angelia Mould, MD;  Location: [place];  Service: Vascular;  Laterality: Left;  . Av fistula placement Left 12/18/2014    Procedure: REVISION OF LEFT ARM ARTERIOVENOUS (AV) GORE-TEX GRAFT WITH INTERPOSITION OF 64mm GRAFT;  Surgeon: Angelia Mould, MD;  Location: [place];  Service: Vascular;  Laterality: Left;  . Avgg removal Left 02/19/2015    Procedure: LIGATION OF ARTERIOVENOUS GORETEX GRAFT LEFT FOREARM;  Surgeon: Angelia Mould, MD;   Location: [place];  Service: Vascular;  Laterality: Left;  . Hematoma evacuation Left 02/19/2015    Procedure: EVACUATION LYMPHOCELE LEFT FOREARM;  Surgeon: Angelia Mould, MD;  Location: [facility] OR;  Service: Vascular;  Laterality: Left;   Family History  Problem Relation Age of Onset  . Lung cancer Father 65    hepatic metastases  . Heart attack Mother 35  . Cancer Brother     unk   Social History  Substance Use Topics  . Smoking status: Former Smoker -- 1.00 packs/day for 10 years    Types: Cigarettes    Quit date: 01/11/1975  . Smokeless tobacco: Never Used  . Alcohol Use: No    Review of Systems  Constitutional: Positive for diaphoresis. Negative for fever, chills, appetite change and fatigue.  HENT: Negative for mouth sores, sore throat and trouble swallowing.   Eyes: Negative for visual disturbance.  Respiratory: Positive for chest tightness and shortness of breath. Negative for cough and wheezing.   Cardiovascular: Positive for chest pain.  Gastrointestinal: Positive for nausea and vomiting. Negative for abdominal pain, diarrhea and abdominal distention.  Endocrine: Negative for polydipsia, polyphagia and polyuria.  Genitourinary: Negative for dysuria, frequency and hematuria.  Musculoskeletal: Negative for gait problem.  Skin: Negative for color change, pallor and rash.  Neurological: Negative for dizziness, syncope, light-headedness and headaches.  Hematological: Does not bruise/bleed easily.  Psychiatric/Behavioral: Negative for behavioral problems and confusion.      Allergies  Penicillins  Home Medications   Prior to Admission medications   Medication Sig Start Date End Date Taking? Authorizing Provider  allopurinol (ZYLOPRIM) 100 MG tablet Take 100 mg by mouth every evening.  06/27/13   Historical Provider, MD  amLODipine (NORVASC) 10 MG tablet Take 10 mg by mouth every evening.     Historical Provider, MD  b complex-vitamin c-folic acid (NEPHRO-VITE) 0.8  MG TABS Take 0.8 mg by mouth every morning.  11/13/11   Historical Provider, MD  calcitRIOL (ROCALTROL) 0.5 MCG capsule Take 1 capsule by mouth daily. 11/04/10   Historical Provider, MD  calcium carbonate (TUMS - DOSED IN MG ELEMENTAL CALCIUM) 500 MG chewable tablet Chew 1-4 tablets by mouth 3 (three) times daily with meals. Take 4 tablets with largest meal of the day and 1-2 with smaller meals and snacks    Historical Provider, MD  cloNIDine (CATAPRES) 0.3 MG tablet Take 0.3 mg by mouth 2 (two) times daily.    Historical Provider, MD  metoprolol (LOPRESSOR) 50 MG tablet Take 50 mg by mouth 2 (two) times daily. 04/09/13   Historical Provider, MD  naproxen sodium (ALEVE) 220 MG tablet Take 220 mg by mouth 2 (two) times daily as needed (Pain).    Historical Provider, MD  oxyCODONE (ROXICODONE) 5 MG immediate release tablet Take 1 tablet (5 mg total) by mouth every 6 (six) hours as needed for  severe pain. 02/19/15   Alvia Grove, PA-C  triamcinolone cream (KENALOG) 0.1 % Apply 1 application topically daily as needed (for itching).     Historical Provider, MD   BP 202/97 mmHg  Pulse 53  Temp(Src) 98.4 F (36.9 C) (Oral)  Resp 16  Ht 6' (1.829 m)  Wt 207 lb (93.895 kg)  BMI 28.07 kg/m2  SpO2 98% Physical Exam  Constitutional: He is oriented to person, place, and time. He appears well-developed and well-nourished. He appears distressed.  No male sitting at the bedside. Intensely diaphoretic across his head, forehead and face. Complaining of chest pain, and holding a fist across his low substernal chest.  HENT:  Head: Normocephalic.  Eyes: Conjunctivae are normal. Pupils are equal, round, and reactive to light. No scleral icterus.  Neck: Normal range of motion. Neck supple. No thyromegaly present.  Cardiovascular: Normal rate and regular rhythm.  Exam reveals no gallop and no friction rub.   No murmur heard. Sinus rhythm with occasional PVCs.  Pulmonary/Chest: Effort normal and breath sounds  normal. No respiratory distress. He has no wheezes. He has no rales.  Bilateral breath sounds to auscultation. Right subclavian Port-A-Cath site appears intact.  Abdominal: Soft. Bowel sounds are normal. He exhibits no distension. There is no tenderness. There is no rebound.  Musculoskeletal: Normal range of motion.  Neurological: He is oriented to person, place, and time.  Skin: Skin is warm and dry. No rash noted.  Psychiatric: He has a normal mood and affect. His behavior is normal.    ED Course  Procedures (including critical care time) Labs Review Labs Reviewed  BASIC METABOLIC PANEL - Abnormal; Notable for the following:    Glucose, Bld 105 (*)    BUN 29 (*)    Creatinine, Ser 9.83 (*)    GFR calc non Af Amer 5 (*)    GFR calc Af Amer 6 (*)    All other components within normal limits  CBC - Abnormal; Notable for the following:    WBC 10.7 (*)    Hemoglobin 11.4 (*)    HCT 35.5 (*)    RDW 17.8 (*)    Platelets 148 (*)    All other components within normal limits  TROPONIN I - Abnormal; Notable for the following:    Troponin I 0.15 (*)    All other components within normal limits  I-STAT TROPOININ, ED - Abnormal; Notable for the following:    Troponin i, poc 0.09 (*)    All other components within normal limits    Imaging Review No results found. I have personally reviewed and evaluated these images and lab results as part of my medical decision-making.   EKG Interpretation   Date/Time:  Wednesday March 31 2015 17:07:11 EDT Ventricular Rate:  69 PR Interval:  240 QRS Duration: 103 QT Interval:  516 QTC Calculation: 553 R Axis:   60 Text Interpretation:  Sinus rhythm Prolonged PR interval Probable  anteroseptal infarct, recent Prolonged QT interval Artifact in lead(s) II  III aVF V5 V6 and baseline wander in lead(s) V6 Confirmed by Jeneen Rinks  MD,  [place] ([id number]) on 03/14/2015 6:31:38 PM      MDM   Final diagnoses:  ST elevation myocardial infarction involving  left anterior descending (LAD) coronary artery    Upon my initial evaluation patient's history and appearance suggest acute myocardial infarction/acute coronary syndrome. Initial EKG shows a grateful of artifact. He was quite uncomfortable and sitting on the edge of the bed and  actively retching. He was given Zofran and fentanyl. He was able to become more comfortable and sit in bed and had EKG repeated. This shows elevation of his ST segments 1-2 mm in leads V2, and V3. Only comparison is 2006 which shows no ST segment changes. Code STEMI immediately placed by myself.  Patient was given heparin bolus and started on drip. His given by mouth aspirin. No additional emesis after aspirin was given. His given nitroglycerin at 5 g infusion. Call was returned to me immediately by Dr. Sherren Mocha the cardiologist on-call. We discussed the patient briefly. He medially except the patient in transfer to [place] for probable PTCI.  CRITICAL CARE Performed by: Tanna Furry JOSEPH   Total critical care time: 51min.  initial evaluation. Multiple re-evaluations. Immediate consultation. Heparin bolus and drip. Nitroglycerin infusion.  Critical care time was exclusive of separately billable procedures and treating other patients.  Critical care was necessary to treat or prevent imminent or life-threatening deterioration.  Critical care was time spent personally by me on the following activities: development of treatment plan with patient and/or surrogate as well as nursing, discussions with consultants, evaluation of patient's response to treatment, examination of patient, obtaining history from patient or surrogate, ordering and performing treatments and interventions, ordering and review of laboratory studies, ordering and review of radiographic studies, pulse oximetry and re-evaluation of patient's condition.     Tanna Furry, MD 04/07/2015 [id number]

## 2015-03-31 NOTE — ED Notes (Signed)
Code Stemi Called to [employer] .  [facility] called.

## 2015-03-31 NOTE — Consult Note (Signed)
[address]       [place] [address]             [phone]      Cardiothoracic Surgery Consultation  Reason for Consult: Acute type A aortic dissection Referring Physician:  Dr. Lauree Chandler  Matthew Kane is an 64 y.o. male.  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 ECG showed ST elevation in the precordium and code STEMI called. He had ongoing CP on arrival to [facility] and cath showed no significant coronary disease. 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 The major vessels appear to come from the true lumen. The ascending aorta at the level of the right PA is 4.6 cm and the distal ascending aorta is 4.7 cm. There is slight tapering just beyond the left subclavian and then the proximal descending aorta is enlarged.   Past Medical History  Diagnosis Date  . Hypertension     Echo in 10/2006-moderate LVH, normal EF  . Obesity   . Gout   . Gastroesophageal reflux disease   . Erectile dysfunction   . Prostatic hypertrophy, benign   . Allergic rhinitis   . Chronic kidney disease, stage 5, kidney failure      dialysis at [facility] T/TH/Sat; ESRD as of 05/2011; leg cramps; hypokalemia; microcytosis  . Secondary hyperparathyroidism of renal origin   . Anemia   . Dialysis patient   . History of blood transfusion   . Thrombocytopenia   . Elevated troponin   . Obstructive sleep apnea 2008    2008- uses CPAP  . Difficult intubation     Spasms after LMA inserted -12/13/10    Past Surgical History  Procedure Laterality Date  . Total hip arthroplasty      bilateral  . Portacath placement    . Cholecystectomy      . Finger amputation      right middle  . Colonoscopy  07/12/2011    Procedure: COLONOSCOPY;  Surgeon: Dorothyann Peng, MD;  Location: [facility];  Service: Endoscopy;  Laterality: N/A;  1:00  . Av fistula placement  05-31-11    Right radiocephalic AVF  . Colostomy  Pt denies having this surgery - (12/17/14.    + subsequent takedown secondary to colonic rupture related to trauma  . Joint replacement      times 3  . Av fistula placement  04/01/2012    Procedure: INSERTION OF ARTERIOVENOUS (AV) GORE-TEX GRAFT ARM;  Surgeon: Elam Dutch, MD;  Location: [facility] OR;  Service: Vascular;  Laterality: Left;  . Revison of arteriovenous fistula Left 05/06/2014    Procedure: REVISON OF ARTERIOVENOUS GRAFT;  Surgeon: Conrad Parmele, MD;  Location: [place];  Service: Vascular;  Laterality: Left;  . Shuntogram N/A 10/06/2011    Procedure: Earney Mallet;  Surgeon: Elam Dutch, MD;  Location: [facility] CATH LAB;  Service: Cardiovascular;  Laterality: N/A;  . Insertion of dialysis catheter Right 12/18/2014    Procedure: ULTRASOUND GUIDED INSERTION OF RIGHT INTERNAL JUGULAR DIALYSIS CATHETER;  Surgeon: Angelia Mould, MD;  Location: [place];  Service: Vascular;  Laterality:  Right;  . Hematoma evacuation Left 12/18/2014    Procedure: EVACUATION OF LEFT ARM LYMPHOCELE;  Surgeon: Angelia Mould, MD;  Location: [place];  Service: Vascular;  Laterality: Left;  . Av fistula placement Left 12/18/2014    Procedure: REVISION OF LEFT ARM ARTERIOVENOUS (AV) GORE-TEX GRAFT WITH INTERPOSITION OF 73m GRAFT;  Surgeon: CAngelia Mould MD;  [place]  Service: Vascular;  Laterality: Left;  . Avgg removal Left 02/19/2015    Procedure: LIGATION OF ARTERIOVENOUS GORETEX GRAFT LEFT FOREARM;  Surgeon: CAngelia Mould MD;  [place]  Service: Vascular;  Laterality: Left;  . Hematoma evacuation Left 02/19/2015    Procedure: EVACUATION LYMPHOCELE LEFT FOREARM;  Surgeon: CAngelia Mould MD;  [facility];  Service: Vascular;  Laterality: Left;    Family History  Problem Relation Age of Onset  . Lung cancer Father 653   hepatic metastases  . Heart attack Mother 522 . Cancer Brother     unk    Social History:  reports that he quit smoking about 40 years ago. His smoking use included Cigarettes. He has a 10 pack-year smoking history. He has never used smokeless tobacco. He reports that he does not drink alcohol or use illicit drugs.  Allergies:  Allergies  Allergen Reactions  . Penicillins Itching and Rash    Medications:  I have reviewed the patient's current medications. Prior to Admission:  Prescriptions prior to admission  Medication Sig Dispense Refill Last Dose  . allopurinol (ZYLOPRIM) 100 MG tablet Take 100 mg by mouth every evening.    02/21/2015  . amLODipine (NORVASC) 10 MG tablet Take 10 mg by mouth every evening.    02/20/2015  . b complex-vitamin c-folic acid (NEPHRO-VITE) 0.8 MG TABS Take 0.8 mg by mouth every morning.    02/21/2015  . calcitRIOL (ROCALTROL) 0.5 MCG capsule Take 1 capsule by mouth daily.   02/21/2015  . calcium carbonate (TUMS - DOSED IN MG ELEMENTAL CALCIUM) 500 MG chewable tablet Chew 1-4 tablets by mouth 3 (three) times daily with meals. Take 4 tablets with largest meal of the day and 1-2 with smaller meals and snacks   02/21/2015  . cloNIDine (CATAPRES) 0.3 MG tablet Take 0.3 mg by mouth 2 (two) times daily.   02/21/2015  . metoprolol (LOPRESSOR) 50 MG tablet Take 50 mg by mouth 2 (two) times daily.   02/21/2015 at 1130  . naproxen sodium (ALEVE) 220 MG tablet Take 220 mg by mouth 2 (two) times daily as needed (Pain).   02/21/2015  . oxyCODONE (ROXICODONE) 5 MG immediate release tablet Take 1 tablet (5 mg total) by mouth every 6 (six) hours as needed for severe pain. 30 tablet 0 02/20/2015  . triamcinolone cream (KENALOG) 0.1 % Apply 1 application topically daily as needed (for itching).    02/20/2015   Scheduled: . allopurinol  100 mg Oral QPM  .  aminocaproic acid (AMICAR) for OHS   Intravenous To OR  . [START ON 04/01/2015] amLODipine  10 mg Oral QPM  . bisacodyl  5 mg Oral Once  . [START ON 04/01/2015] calcitRIOL  0.5 mcg Oral Daily  . [START ON 04/01/2015] calcium carbonate  1-4 tablet Oral TID WC  . cefUROXime (ZINACEF)  IV  1.5 g Intravenous To OR  . cefUROXime (ZINACEF)  IV  750 mg Intravenous To OR  . chlorhexidine      . Chlorhexidine Gluconate Cloth  6 each Topical Once   And  .  Chlorhexidine Gluconate Cloth  6 each Topical Once  . cloNIDine  0.3 mg Oral BID  . dexmedetomidine  0.1-0.7 mcg/kg/hr Intravenous To OR  . DOPamine  0-10 mcg/kg/min Intravenous To OR  . epinephrine  0-10 mcg/min Intravenous To OR  . heparin-papaverine-plasmalyte irrigation   Irrigation To OR  . heparin 30,000 units/NS 1000 mL solution for CELLSAVER   Other To OR  . insulin (NOVOLIN-R) infusion   Intravenous To OR  . magnesium sulfate  40 mEq Other To OR  . metoprolol  50 mg Oral BID  . metoprolol tartrate  12.5 mg Oral Once  . multivitamin  1 tablet Oral QHS  . nitroGLYCERIN  2-200 mcg/min Intravenous To OR  . phenylephrine (NEO-SYNEPHRINE) Adult infusion  30-200 mcg/min Intravenous To OR  . potassium chloride  80 mEq Other To OR  . sodium chloride  3 mL Intravenous Q12H  . sodium chloride  3 mL Intravenous Q12H  . vancomycin  1,250 mg Intravenous To OR   Continuous: . nitroPRUSSide 0.3 mcg/kg/min (04/03/2015 2258)   QJJ:HERDEY chloride, sodium chloride, acetaminophen, fentaNYL (SUBLIMAZE) injection, morphine injection, ondansetron (ZOFRAN) IV, sodium chloride, sodium chloride Anti-infectives    Start     Dose/Rate Route Frequency Ordered Stop   03/12/2015 2330  vancomycin (VANCOCIN) 1,250 mg in sodium chloride 0.9 % 250 mL IVPB     1,250 mg 166.7 mL/hr over 90 Minutes Intravenous To Surgery 03/30/2015 2322 04/01/15 2330   03/18/2015 2330  cefUROXime (ZINACEF) 1.5 g in dextrose 5 % 50 mL IVPB     1.5 g 100 mL/hr over 30 Minutes Intravenous To  Surgery 03/16/2015 2322 04/01/15 2330   03/12/2015 2330  cefUROXime (ZINACEF) 750 mg in dextrose 5 % 50 mL IVPB     750 mg 100 mL/hr over 30 Minutes Intravenous To Surgery 03/08/2015 2321 04/01/15 2330      Results for orders placed or performed during the hospital encounter of 03/26/2015 (from the past 48 hour(s))  Basic metabolic panel     Status: Abnormal   Collection Time: 03/27/2015  5:15 PM  Result Value Ref Range   Sodium 140 135 - 145 mmol/L   Potassium 3.7 3.5 - 5.1 mmol/L   Chloride 101 101 - 111 mmol/L   CO2 26 22 - 32 mmol/L   Glucose, Bld 105 (H) 65 - 99 mg/dL   BUN 29 (H) 6 - 20 mg/dL   Creatinine, Ser 9.83 (H) 0.61 - 1.24 mg/dL   Calcium 9.1 8.9 - 10.3 mg/dL   GFR calc non Af Amer 5 (L) >60 mL/min   GFR calc Af Amer 6 (L) >60 mL/min    Comment: (NOTE) The eGFR has been calculated using the CKD EPI equation. This calculation has not been validated in all clinical situations. eGFR's persistently <60 mL/min signify possible Chronic Kidney Disease.    Anion gap 13 5 - 15  CBC     Status: Abnormal   Collection Time: 03/10/2015  5:15 PM  Result Value Ref Range   WBC 10.7 (H) 4.0 - 10.5 K/uL   RBC 4.36 4.22 - 5.81 MIL/uL   Hemoglobin 11.4 (L) 13.0 - 17.0 g/dL   HCT 35.5 (L) 39.0 - 52.0 %   MCV 81.4 78.0 - 100.0 fL   MCH 26.1 26.0 - 34.0 pg   MCHC 32.1 30.0 - 36.0 g/dL   RDW 17.8 (H) 11.5 - 15.5 %   Platelets 148 (L) 150 - 400 K/uL    Comment: SPECIMEN CHECKED FOR CLOTS  PLATELET COUNT CONFIRMED BY SMEAR LARGE PLATELETS PRESENT   Troponin I     Status: Abnormal   Collection Time: 03/30/2015  5:15 PM  Result Value Ref Range   Troponin I 0.15 (H) <0.031 ng/mL    Comment:        PERSISTENTLY INCREASED TROPONIN VALUES IN THE RANGE OF 0.04-0.49 ng/mL CAN BE SEEN IN:       -UNSTABLE ANGINA       -CONGESTIVE HEART FAILURE       -MYOCARDITIS       -CHEST TRAUMA       -ARRYHTHMIAS       -LATE PRESENTING MYOCARDIAL INFARCTION       -COPD   CLINICAL FOLLOW-UP RECOMMENDED.     I-stat troponin, ED     Status: Abnormal   Collection Time: 04/04/2015  5:58 PM  Result Value Ref Range   Troponin i, poc 0.09 (HH) 0.00 - 0.08 ng/mL   Comment NOTIFIED PHYSICIAN    Comment 3            Comment: Due to the release kinetics of cTnI, a negative result within the first hours of the onset of symptoms does not rule out myocardial infarction with certainty. If myocardial infarction is still suspected, repeat the test at appropriate intervals.     Ct Angio Chest Aorta W/cm &/or Wo/cm  04/06/2015   CLINICAL DATA:  Severe back pain and chest pain. Cardiac catheterization earlier today. End-stage renal disease.  EXAM: CT ANGIOGRAPHY CHEST, ABDOMEN AND PELVIS  TECHNIQUE: Multidetector CT imaging through the chest, abdomen and pelvis was performed using the standard protocol during bolus administration of intravenous contrast. Multiplanar reconstructed images and MIPs were obtained and reviewed to evaluate the vascular anatomy.  CONTRAST:  161m OMNIPAQUE IOHEXOL 350 MG/ML SOLN  COMPARISON:  CT chest 12/04/2014.  CT abdomen and pelvis 07/20/2014.  FINDINGS: CTA CHEST FINDINGS  Unenhanced images of the chest demonstrate Coronary artery and mitral valve annulus calcifications. Calcification of the aorta. Right central venous catheter with tip in the SVC. No evidence of intramural thrombus.  Arterial phase contrast-enhanced images are obtained. There is a type A dissection of the aorta. Dissection begins at the aortic root, at the level of the aortic valve and extends throughout the ascending, transverse, and descending thoracic aorta. The true lumen is severely compressed in the ascending thoracic region with the false lumen being the dominant lumen throughout. Flow is demonstrated in the true and false lumen. Opacity of contrast throughout the aorta is decreased, possibly due to poor cardiac function or slow flow resulting from the dissection. The left common carotid artery arises from the  innominate artery. Great vessel origins are patent. Great vessels appear to arise from the true lumen.  Cardiac enlargement. No significant lymphadenopathy in the chest. Enlarged right thyroid gland. Esophagus is decompressed. Small bilateral pleural effusions with basilar atelectasis. Motion artifact limits evaluation of the lungs.  Review of the MIP images confirms the above findings.  CTA ABDOMEN AND PELVIS FINDINGS  Aortic dissection extends into the abdominal aorta and into the left iliac artery. The celiac axis, superior mesenteric artery, single right renal artery and duplicated left renal arteries, inferior mesenteric artery, and right iliac artery appear to arise from the true lumen. Renal nephrograms are diminished bilaterally suggesting possible vascular compromise to the kidneys.  Surgical absence of the gallbladder. Small cyst in the liver. Pancreas, spleen, adrenal glands, inferior vena cava, and retroperitoneal lymph nodes are unremarkable. Small accessory spleen. Kidneys  are diffusely atrophic without hydronephrosis. Multiple renal lesions likely representing cysts. Stomach, small bowel, and colon are not abnormally distended. No free air or free fluid in the abdomen.  Pelvis: Visualization of the low pelvis is limited due to streak artifact arising from bilateral hip arthroplasties. No free or loculated pelvic fluid collections are identified. No pelvic mass lesions. Degenerative changes throughout the thoracic and lumbar spine. No destructive bone lesions.  Review of the MIP images confirms the above findings.  IMPRESSION: Type A aortic dissection beginning at the level of the aortic valve and extending throughout the thoracic and abdominal aorta and into the left iliac artery. The true lumen is significantly compressed in the ascending region. Major vessels appear to arise from the true lumen. Delayed renal nephrograms are suggested.  These results were discussed at the view box prior to the time  of interpretation on 04/04/2015 at 10:21 pm with Dr. Lauree Chandler , who verbally acknowledged these results.   Electronically Signed   By: Lucienne Capers M.D.   On: 03/13/2015 22:30   Ct Angio Abd/pel W/ And/or W/o  03/22/2015   CLINICAL DATA:  Severe back pain and chest pain. Cardiac catheterization earlier today. End-stage renal disease.  EXAM: CT ANGIOGRAPHY CHEST, ABDOMEN AND PELVIS  TECHNIQUE: Multidetector CT imaging through the chest, abdomen and pelvis was performed using the standard protocol during bolus administration of intravenous contrast. Multiplanar reconstructed images and MIPs were obtained and reviewed to evaluate the vascular anatomy.  CONTRAST:  129m OMNIPAQUE IOHEXOL 350 MG/ML SOLN  COMPARISON:  CT chest 12/04/2014.  CT abdomen and pelvis 07/20/2014.  FINDINGS: CTA CHEST FINDINGS  Unenhanced images of the chest demonstrate Coronary artery and mitral valve annulus calcifications. Calcification of the aorta. Right central venous catheter with tip in the SVC. No evidence of intramural thrombus.  Arterial phase contrast-enhanced images are obtained. There is a type A dissection of the aorta. Dissection begins at the aortic root, at the level of the aortic valve and extends throughout the ascending, transverse, and descending thoracic aorta. The true lumen is severely compressed in the ascending thoracic region with the false lumen being the dominant lumen throughout. Flow is demonstrated in the true and false lumen. Opacity of contrast throughout the aorta is decreased, possibly due to poor cardiac function or slow flow resulting from the dissection. The left common carotid artery arises from the innominate artery. Great vessel origins are patent. Great vessels appear to arise from the true lumen.  Cardiac enlargement. No significant lymphadenopathy in the chest. Enlarged right thyroid gland. Esophagus is decompressed. Small bilateral pleural effusions with basilar atelectasis. Motion  artifact limits evaluation of the lungs.  Review of the MIP images confirms the above findings.  CTA ABDOMEN AND PELVIS FINDINGS  Aortic dissection extends into the abdominal aorta and into the left iliac artery. The celiac axis, superior mesenteric artery, single right renal artery and duplicated left renal arteries, inferior mesenteric artery, and right iliac artery appear to arise from the true lumen. Renal nephrograms are diminished bilaterally suggesting possible vascular compromise to the kidneys.  Surgical absence of the gallbladder. Small cyst in the liver. Pancreas, spleen, adrenal glands, inferior vena cava, and retroperitoneal lymph nodes are unremarkable. Small accessory spleen. Kidneys are diffusely atrophic without hydronephrosis. Multiple renal lesions likely representing cysts. Stomach, small bowel, and colon are not abnormally distended. No free air or free fluid in the abdomen.  Pelvis: Visualization of the low pelvis is limited due to streak artifact arising from bilateral  hip arthroplasties. No free or loculated pelvic fluid collections are identified. No pelvic mass lesions. Degenerative changes throughout the thoracic and lumbar spine. No destructive bone lesions.  Review of the MIP images confirms the above findings.  IMPRESSION: Type A aortic dissection beginning at the level of the aortic valve and extending throughout the thoracic and abdominal aorta and into the left iliac artery. The true lumen is significantly compressed in the ascending region. Major vessels appear to arise from the true lumen. Delayed renal nephrograms are suggested.  These results were discussed at the view box prior to the time of interpretation on 03/13/2015 at 10:21 pm with Dr. Lauree Chandler , who verbally acknowledged these results.   Electronically Signed   By: Lucienne Capers M.D.   On: 03/22/2015 22:30    Review of Systems  Unable to perform ROS: critical illness   Blood pressure 134/88, pulse 73,  temperature 97.1 F (36.2 C), temperature source Oral, resp. rate 19, height 6' (1.829 m), weight 85.2 kg (187 lb 13.3 oz), SpO2 96 %. Physical Exam  Constitutional: He is oriented to person, place, and time. He appears well-developed and well-nourished.  In pain  HENT:  Head: Normocephalic and atraumatic.  Mouth/Throat: Oropharynx is clear and moist.  Eyes: EOM are normal. Pupils are equal, round, and reactive to light.  Neck: Normal range of motion. Neck supple. No JVD present.  Cardiovascular: Normal rate, regular rhythm, normal heart sounds and intact distal pulses.   No murmur heard. Respiratory: Effort normal and breath sounds normal. No respiratory distress. He has no rales.  GI: Soft. Bowel sounds are normal. He exhibits no distension and no mass. There is no tenderness.  Musculoskeletal: He exhibits no edema.  Lymphadenopathy:    He has no cervical adenopathy.  Neurological: He is alert and oriented to person, place, and time. He has normal strength. No cranial nerve deficit or sensory deficit.  Skin: Skin is warm and dry.  Psychiatric: He has a normal mood and affect.   CLINICAL DATA: Severe back pain and chest pain. Cardiac catheterization earlier today. End-stage renal disease.  EXAM: CT ANGIOGRAPHY CHEST, ABDOMEN AND PELVIS  TECHNIQUE: Multidetector CT imaging through the chest, abdomen and pelvis was performed using the standard protocol during bolus administration of intravenous contrast. Multiplanar reconstructed images and MIPs were obtained and reviewed to evaluate the vascular anatomy.  CONTRAST: 164m OMNIPAQUE IOHEXOL 350 MG/ML SOLN  COMPARISON: CT chest 12/04/2014. CT abdomen and pelvis 07/20/2014.  FINDINGS: CTA CHEST FINDINGS  Unenhanced images of the chest demonstrate Coronary artery and mitral valve annulus calcifications. Calcification of the aorta. Right central venous catheter with tip in the SVC. No evidence of intramural  thrombus.  Arterial phase contrast-enhanced images are obtained. There is a type A dissection of the aorta. Dissection begins at the aortic root, at the level of the aortic valve and extends throughout the ascending, transverse, and descending thoracic aorta. The true lumen is severely compressed in the ascending thoracic region with the false lumen being the dominant lumen throughout. Flow is demonstrated in the true and false lumen. Opacity of contrast throughout the aorta is decreased, possibly due to poor cardiac function or slow flow resulting from the dissection. The left common carotid artery arises from the innominate artery. Great vessel origins are patent. Great vessels appear to arise from the true lumen.  Cardiac enlargement. No significant lymphadenopathy in the chest. Enlarged right thyroid gland. Esophagus is decompressed. Small bilateral pleural effusions with basilar atelectasis. Motion artifact  limits evaluation of the lungs.  Review of the MIP images confirms the above findings.  CTA ABDOMEN AND PELVIS FINDINGS  Aortic dissection extends into the abdominal aorta and into the left iliac artery. The celiac axis, superior mesenteric artery, single right renal artery and duplicated left renal arteries, inferior mesenteric artery, and right iliac artery appear to arise from the true lumen. Renal nephrograms are diminished bilaterally suggesting possible vascular compromise to the kidneys.  Surgical absence of the gallbladder. Small cyst in the liver. Pancreas, spleen, adrenal glands, inferior vena cava, and retroperitoneal lymph nodes are unremarkable. Small accessory spleen. Kidneys are diffusely atrophic without hydronephrosis. Multiple renal lesions likely representing cysts. Stomach, small bowel, and colon are not abnormally distended. No free air or free fluid in the abdomen.  Pelvis: Visualization of the low pelvis is limited due to streak artifact  arising from bilateral hip arthroplasties. No free or loculated pelvic fluid collections are identified. No pelvic mass lesions. Degenerative changes throughout the thoracic and lumbar spine. No destructive bone lesions.  Review of the MIP images confirms the above findings.  IMPRESSION: Type A aortic dissection beginning at the level of the aortic valve and extending throughout the thoracic and abdominal aorta and into the left iliac artery. The true lumen is significantly compressed in the ascending region. Major vessels appear to arise from the true lumen. Delayed renal nephrograms are suggested.  These results were discussed at the view box prior to the time of interpretation on 04/02/2015 at 10:21 pm with Dr. Lauree Chandler , who verbally acknowledged these results.   Electronically Signed  By: Lucienne Capers M.D.  On: 03/09/2015 22:30    Assessment/Plan:  I have personally reviewed his cath and CTA of the chest, abdomen and pelvis.  He has an acute type A aortic dissection with mild aneurysmal enlargement of the ascending, arch and descending aorta.  He has no significant coronary disease and normal LV function. I think the best option is to proceed with emergent repair. I discussed the operative procedure with the patient and family including alternatives, benefits and risks; including but not limited to bleeding, blood transfusion, infection, stroke, myocardial infarction,  heart block requiring a permanent pacemaker, organ dysfunction, and death.  Sharman Crate understands and agrees to proceed.    Gaye Pollack 04/04/2015, 11:43 PM

## 2015-03-31 NOTE — H&P (Addendum)
Patient ID: Matthew Kane MRN: [id number] DOB/AGE: Mar 02, 1951 64 y.o. Admit date: 04/04/2015  Primary Cardiologist: [facility] in [facility] office (Had been [place])  HPI: 64 yo male with history of ESRD on HD, HTN, GERD, BPH, OSA who is transferred to [place] from [facility] ED for emergent cardiac cath. He presented to the ED at [facility] today with c/o chest pain and nausea that started around 5pm. EKG with ST elevation in the precordial leads. Code STEMI called. At time of arrival to [facility], pt c/o ongoing chest pain. Mild SOB. He has been on ESRD since 2012. Cardiac cath in 2006 with minimal CAD. No cardiac issues since then. He has seen Dr. Lattie Haw in our [place] office in 2013 for pre-operative evaluation.   Review of systems complete and found to be negative unless listed above   Past Medical History  Diagnosis Date  . Hypertension     Echo in 10/2006-moderate LVH, normal EF  . Obesity   . Gout   . Gastroesophageal reflux disease   . Erectile dysfunction   . Prostatic hypertrophy, benign   . Allergic rhinitis   . Chronic kidney disease, stage 5, kidney failure      dialysis at [facility] T/TH/Sat; ESRD as of 05/2011; leg cramps; hypokalemia; microcytosis  . Secondary hyperparathyroidism of renal origin   . Anemia   . Dialysis patient   . History of blood transfusion   . Thrombocytopenia   . Elevated troponin   . Obstructive sleep apnea 2008    2008- uses CPAP  . Difficult intubation     Spasms after LMA inserted -12/13/10    Family History  Problem Relation Age of Onset  . Lung cancer Father 98    hepatic metastases  . Heart attack Mother 29  . Cancer Brother     unk    Social History   Social History  . Marital Status: Divorced    Spouse Name: N/A  . Number of Children: 3  . Years of Education: N/A   Occupational History  . Retired     [employer]   Social History Main Topics  . Smoking status: Former Smoker -- 1.00 packs/day for 10  years    Types: Cigarettes    Quit date: 01/11/1975  . Smokeless tobacco: Never Used  . Alcohol Use: No  . Drug Use: No  . Sexual Activity: Not on file   Other Topics Concern  . Not on file   Social History Narrative   Lives alone    Past Surgical History  Procedure Laterality Date  . Total hip arthroplasty      bilateral  . Portacath placement    . Cholecystectomy    . Finger amputation      right middle  . Colonoscopy  07/12/2011    Procedure: COLONOSCOPY;  Surgeon: Dorothyann Peng, MD;  [facility]: [facility];  Service: Endoscopy;  Laterality: N/A;  1:00  . Av fistula placement  05-31-11    Right radiocephalic AVF  . Colostomy  Pt denies having this surgery - (12/17/14.    + subsequent takedown secondary to colonic rupture related to trauma  . Joint replacement      times 3  . Av fistula placement  04/01/2012    Procedure: INSERTION OF ARTERIOVENOUS (AV) GORE-TEX GRAFT ARM;  Surgeon: Elam Dutch, MD;  Location: [place];  Service: Vascular;  Laterality: Left;  . Revison of arteriovenous fistula Left 05/06/2014  Procedure: REVISON OF ARTERIOVENOUS GRAFT;  Surgeon: Conrad Delta, MD;  Location: [place];  Service: Vascular;  Laterality: Left;  . Shuntogram N/A 10/06/2011    Procedure: Earney Mallet;  Surgeon: Elam Dutch, MD;  Location: [facility] CATH LAB;  Service: Cardiovascular;  Laterality: N/A;  . Insertion of dialysis catheter Right 12/18/2014    Procedure: ULTRASOUND GUIDED INSERTION OF RIGHT INTERNAL JUGULAR DIALYSIS CATHETER;  Surgeon: Angelia Mould, MD;  Location: [place];  Service: Vascular;  Laterality: Right;  . Hematoma evacuation Left 12/18/2014    Procedure: EVACUATION OF LEFT ARM LYMPHOCELE;  Surgeon: Angelia Mould, MD;  Location: [place];  Service: Vascular;  Laterality: Left;  . Av fistula placement Left 12/18/2014    Procedure: REVISION OF LEFT ARM ARTERIOVENOUS (AV) GORE-TEX GRAFT WITH INTERPOSITION OF 27mm GRAFT;  Surgeon: Angelia Mould, MD;   Location: [place];  Service: Vascular;  Laterality: Left;  . Avgg removal Left 02/19/2015    Procedure: LIGATION OF ARTERIOVENOUS GORETEX GRAFT LEFT FOREARM;  Surgeon: Angelia Mould, MD;  Location: [place];  Service: Vascular;  Laterality: Left;  . Hematoma evacuation Left 02/19/2015    Procedure: EVACUATION LYMPHOCELE LEFT FOREARM;  Surgeon: Angelia Mould, MD;  Location: [place];  Service: Vascular;  Laterality: Left;    Allergies  Allergen Reactions  . Penicillins Itching and Rash    Prior to Admission Meds:  Prior to Admission medications   Medication Sig Start Date End Date Taking? Authorizing Provider  allopurinol (ZYLOPRIM) 100 MG tablet Take 100 mg by mouth every evening.  06/27/13   Historical Provider, MD  amLODipine (NORVASC) 10 MG tablet Take 10 mg by mouth every evening.     Historical Provider, MD  b complex-vitamin c-folic acid (NEPHRO-VITE) 0.8 MG TABS Take 0.8 mg by mouth every morning.  11/13/11   Historical Provider, MD  calcitRIOL (ROCALTROL) 0.5 MCG capsule Take 1 capsule by mouth daily. 11/04/10   Historical Provider, MD  calcium carbonate (TUMS - DOSED IN MG ELEMENTAL CALCIUM) 500 MG chewable tablet Chew 1-4 tablets by mouth 3 (three) times daily with meals. Take 4 tablets with largest meal of the day and 1-2 with smaller meals and snacks    Historical Provider, MD  cloNIDine (CATAPRES) 0.3 MG tablet Take 0.3 mg by mouth 2 (two) times daily.    Historical Provider, MD  metoprolol (LOPRESSOR) 50 MG tablet Take 50 mg by mouth 2 (two) times daily. 04/09/13   Historical Provider, MD  naproxen sodium (ALEVE) 220 MG tablet Take 220 mg by mouth 2 (two) times daily as needed (Pain).    Historical Provider, MD  oxyCODONE (ROXICODONE) 5 MG immediate release tablet Take 1 tablet (5 mg total) by mouth every 6 (six) hours as needed for severe pain. 02/19/15   Alvia Grove, PA-C  triamcinolone cream (KENALOG) 0.1 % Apply 1 application topically daily as needed (for itching).      Historical Provider, MD    Physical Exam: Blood pressure 202/97, pulse 53, temperature 98.4 F (36.9 C), temperature source Oral, resp. rate 16, height 6' (1.829 m), weight 207 lb (93.895 kg), SpO2 98 %.    General: Well developed, well nourished, appears to be uncomfortable.  HEENT: OP clear, mucus membranes moist  SKIN: warm, dry. No rashes.  Neuro: No focal deficits  Musculoskeletal: Muscle strength 5/5 all ext  Psychiatric: Mood and affect normal  Neck: No JVD, no carotid bruits, no thyromegaly, no lymphadenopathy.  Lungs:Clear bilaterally, no wheezes, rhonci, crackles  Cardiovascular: Regular rate and rhythm. No murmurs, gallops or rubs.  Abdomen:Soft. Bowel sounds present. Non-tender.  Extremities: No lower extremity edema. Pulses are 2 + in the bilateral DP/PT.  Labs:   Lab Results  Component Value Date   WBC 10.7* 03/18/2015   HGB 11.4* 03/18/2015   HCT 35.5* 03/08/2015   MCV 81.4 03/17/2015   PLT 148* 03/18/2015     Recent Labs Lab 03/15/2015 1715  NA 140  K 3.7  CL 101  CO2 26  BUN 29*  CREATININE 9.83*  CALCIUM 9.1  GLUCOSE 105*   Lab Results  Component Value Date   TROPONINI 0.15* 04/01/2015     EKG: Sinus. 57mm ST elevation V2, V3. QT prolonged.    ASSESSMENT AND PLAN:   1. Acute chest pain with EKG changes: Subtle ST elevation in precordial leads. Plan emergent cardiac cath with possible PCI. Further plans to follow.   Darlina Guys, MD 04/03/2015, 6:41 PM  Addendum: 03/13/2015 10:45pm Cardiac cath with no obstructive CAD. STAT CTA chest/abd/pelvis demonstrated Type A Aortic dissection extending from the aortic root to the infrarenal aorta. Pt with ongoing chest pain. SBP 110-140 on NTG drip. NTG drip stopped and Nipride drip started. CT surgery notified. Long discussion with pt and family at bedside. Surgery is indicated tonight. I have reviewed the indications for surgery and the patient is in agreement to proceed.    Anora Schwenke 03/14/2015 10:47 PM

## 2015-03-31 NOTE — ED Notes (Signed)
Chest pain radiating into back x1 hour

## 2015-04-01 ENCOUNTER — Inpatient Hospital Stay (HOSPITAL_COMMUNITY): Payer: Medicare HMO | Admitting: Certified Registered Nurse Anesthetist

## 2015-04-01 ENCOUNTER — Inpatient Hospital Stay (HOSPITAL_COMMUNITY): Payer: Medicare HMO

## 2015-04-01 ENCOUNTER — Encounter (HOSPITAL_COMMUNITY): Payer: Self-pay | Admitting: Cardiovascular Disease

## 2015-04-01 DIAGNOSIS — I7100 Dissection of unspecified site of aorta: Secondary | ICD-10-CM | POA: Diagnosis present

## 2015-04-01 LAB — CBC
HCT: 31.9 % — ABNORMAL LOW (ref 39.0–52.0)
HCT: 23.1 % — ABNORMAL LOW (ref 39.0–52.0)
HEMATOCRIT: 25 % — AB (ref 39.0–52.0)
HEMOGLOBIN: 7.5 g/dL — AB (ref 13.0–17.0)
Hemoglobin: 10.7 g/dL — ABNORMAL LOW (ref 13.0–17.0)
Hemoglobin: 8.3 g/dL — ABNORMAL LOW (ref 13.0–17.0)
MCH: 26 pg (ref 26.0–34.0)
MCH: 26.5 pg (ref 26.0–34.0)
MCH: 26.8 pg (ref 26.0–34.0)
MCHC: 33.5 g/dL (ref 30.0–36.0)
MCHC: 33.2 g/dL (ref 30.0–36.0)
MCHC: 32.5 g/dL (ref 30.0–36.0)
MCV: 79.9 fL (ref 78.0–100.0)
MCV: 79.9 fL (ref 78.0–100.0)
MCV: 79.9 fL (ref 78.0–100.0)
PLATELETS: 107 10*3/uL — AB (ref 150–400)
PLATELETS: 102 10*3/uL — AB (ref 150–400)
Platelets: 97 10*3/uL — ABNORMAL LOW (ref 150–400)
RBC: 2.89 MIL/uL — AB (ref 4.22–5.81)
RBC: 3.13 MIL/uL — ABNORMAL LOW (ref 4.22–5.81)
RBC: 3.99 MIL/uL — AB (ref 4.22–5.81)
RDW: 18 % — ABNORMAL HIGH (ref 11.5–15.5)
RDW: 17.7 % — AB (ref 11.5–15.5)
RDW: 17.8 % — ABNORMAL HIGH (ref 11.5–15.5)
WBC: 8.5 10*3/uL (ref 4.0–10.5)
WBC: 6.9 10*3/uL (ref 4.0–10.5)
WBC: 6.7 10*3/uL (ref 4.0–10.5)

## 2015-04-01 LAB — POCT I-STAT, CHEM 8
BUN: 31 mg/dL — ABNORMAL HIGH (ref 6–20)
BUN: 30 mg/dL — AB (ref 6–20)
BUN: 29 mg/dL — ABNORMAL HIGH (ref 6–20)
BUN: 30 mg/dL — ABNORMAL HIGH (ref 6–20)
BUN: 31 mg/dL — ABNORMAL HIGH (ref 6–20)
BUN: 32 mg/dL — ABNORMAL HIGH (ref 6–20)
CALCIUM ION: 1.11 mmol/L — AB (ref 1.13–1.30)
CALCIUM ION: 1.02 mmol/L — AB (ref 1.13–1.30)
CHLORIDE: 100 mmol/L — AB (ref 101–111)
CREATININE: 9.6 mg/dL — AB (ref 0.61–1.24)
CREATININE: 9.9 mg/dL — AB (ref 0.61–1.24)
Calcium, Ion: 1.14 mmol/L (ref 1.13–1.30)
Calcium, Ion: 1.05 mmol/L — ABNORMAL LOW (ref 1.13–1.30)
Calcium, Ion: 1.03 mmol/L — ABNORMAL LOW (ref 1.13–1.30)
Calcium, Ion: 1.04 mmol/L — ABNORMAL LOW (ref 1.13–1.30)
Chloride: 98 mmol/L — ABNORMAL LOW (ref 101–111)
Chloride: 100 mmol/L — ABNORMAL LOW (ref 101–111)
Chloride: 98 mmol/L — ABNORMAL LOW (ref 101–111)
Chloride: 99 mmol/L — ABNORMAL LOW (ref 101–111)
Chloride: 103 mmol/L (ref 101–111)
Creatinine, Ser: 9.1 mg/dL — ABNORMAL HIGH (ref 0.61–1.24)
Creatinine, Ser: 8.9 mg/dL — ABNORMAL HIGH (ref 0.61–1.24)
Creatinine, Ser: 8.8 mg/dL — ABNORMAL HIGH (ref 0.61–1.24)
Creatinine, Ser: 9 mg/dL — ABNORMAL HIGH (ref 0.61–1.24)
GLUCOSE: 124 mg/dL — AB (ref 65–99)
GLUCOSE: 105 mg/dL — AB (ref 65–99)
GLUCOSE: 116 mg/dL — AB (ref 65–99)
Glucose, Bld: 103 mg/dL — ABNORMAL HIGH (ref 65–99)
Glucose, Bld: 73 mg/dL (ref 65–99)
Glucose, Bld: 115 mg/dL — ABNORMAL HIGH (ref 65–99)
HCT: 26 % — ABNORMAL LOW (ref 39.0–52.0)
HEMATOCRIT: 33 % — AB (ref 39.0–52.0)
HEMATOCRIT: 23 % — AB (ref 39.0–52.0)
HEMATOCRIT: 23 % — AB (ref 39.0–52.0)
HEMATOCRIT: 23 % — AB (ref 39.0–52.0)
HEMATOCRIT: 24 % — AB (ref 39.0–52.0)
HEMOGLOBIN: 11.2 g/dL — AB (ref 13.0–17.0)
HEMOGLOBIN: 8.8 g/dL — AB (ref 13.0–17.0)
HEMOGLOBIN: 7.8 g/dL — AB (ref 13.0–17.0)
HEMOGLOBIN: 7.8 g/dL — AB (ref 13.0–17.0)
HEMOGLOBIN: 8.2 g/dL — AB (ref 13.0–17.0)
Hemoglobin: 7.8 g/dL — ABNORMAL LOW (ref 13.0–17.0)
POTASSIUM: 3.8 mmol/L (ref 3.5–5.1)
POTASSIUM: 3.6 mmol/L (ref 3.5–5.1)
POTASSIUM: 3.6 mmol/L (ref 3.5–5.1)
POTASSIUM: 3.9 mmol/L (ref 3.5–5.1)
POTASSIUM: 4.4 mmol/L (ref 3.5–5.1)
Potassium: 3.8 mmol/L (ref 3.5–5.1)
SODIUM: 136 mmol/L (ref 135–145)
SODIUM: 136 mmol/L (ref 135–145)
SODIUM: 131 mmol/L — AB (ref 135–145)
SODIUM: 133 mmol/L — AB (ref 135–145)
Sodium: 136 mmol/L (ref 135–145)
Sodium: 135 mmol/L (ref 135–145)
TCO2: 26 mmol/L (ref 0–100)
TCO2: 23 mmol/L (ref 0–100)
TCO2: 26 mmol/L (ref 0–100)
TCO2: 22 mmol/L (ref 0–100)
TCO2: 20 mmol/L (ref 0–100)
TCO2: 20 mmol/L (ref 0–100)

## 2015-04-01 LAB — CREATININE, SERUM
Creatinine, Ser: 9.1 mg/dL — ABNORMAL HIGH (ref 0.61–1.24)
GFR calc non Af Amer: 5 mL/min — ABNORMAL LOW (ref >60)
GFR, EST AFRICAN AMERICAN: 6 mL/min — AB (ref >60)

## 2015-04-01 LAB — FIBRINOGEN: Fibrinogen: 207 mg/dL (ref 204–475)

## 2015-04-01 LAB — GLUCOSE, CAPILLARY
GLUCOSE-CAPILLARY: 108 mg/dL — AB (ref 65–99)
GLUCOSE-CAPILLARY: 110 mg/dL — AB (ref 65–99)
GLUCOSE-CAPILLARY: 111 mg/dL — AB (ref 65–99)
GLUCOSE-CAPILLARY: 141 mg/dL — AB (ref 65–99)
Glucose-Capillary: 116 mg/dL — ABNORMAL HIGH (ref 65–99)
Glucose-Capillary: 108 mg/dL — ABNORMAL HIGH (ref 65–99)

## 2015-04-01 LAB — POCT I-STAT 3, ART BLOOD GAS (G3+)
ACID-BASE DEFICIT: 3 mmol/L — AB (ref 0.0–2.0)
ACID-BASE DEFICIT: 5 mmol/L — AB (ref 0.0–2.0)
ACID-BASE EXCESS: 3 mmol/L — AB (ref 0.0–2.0)
BICARBONATE: 21.7 meq/L (ref 20.0–24.0)
Bicarbonate: 27.1 mEq/L — ABNORMAL HIGH (ref 20.0–24.0)
Bicarbonate: 20.2 mEq/L (ref 20.0–24.0)
O2 SAT: 100 %
O2 Saturation: 93 %
O2 Saturation: 99 %
PH ART: 7.338 — AB (ref 7.350–7.450)
PO2 ART: 58 mmHg — AB (ref 80.0–100.0)
Patient temperature: 34.1
TCO2: 28 mmol/L (ref 0–100)
TCO2: 23 mmol/L (ref 0–100)
TCO2: 21 mmol/L (ref 0–100)
pCO2 arterial: 39.1 mmHg (ref 35.0–45.0)
pCO2 arterial: 33.8 mmHg — ABNORMAL LOW (ref 35.0–45.0)
pCO2 arterial: 37.3 mmHg (ref 35.0–45.0)
pH, Arterial: 7.449 (ref 7.350–7.450)
pH, Arterial: 7.401 (ref 7.350–7.450)
pO2, Arterial: 416 mmHg — ABNORMAL HIGH (ref 80.0–100.0)
pO2, Arterial: 158 mmHg — ABNORMAL HIGH (ref 80.0–100.0)

## 2015-04-01 LAB — BASIC METABOLIC PANEL
Anion gap: 11 (ref 5–15)
BUN: 31 mg/dL — AB (ref 6–20)
CHLORIDE: 99 mmol/L — AB (ref 101–111)
CO2: 26 mmol/L (ref 22–32)
Calcium: 8.7 mg/dL — ABNORMAL LOW (ref 8.9–10.3)
Creatinine, Ser: 9.9 mg/dL — ABNORMAL HIGH (ref 0.61–1.24)
GFR calc Af Amer: 6 mL/min — ABNORMAL LOW (ref >60)
GFR, EST NON AFRICAN AMERICAN: 5 mL/min — AB (ref >60)
GLUCOSE: 120 mg/dL — AB (ref 65–99)
POTASSIUM: 3.5 mmol/L (ref 3.5–5.1)
Sodium: 136 mmol/L (ref 135–145)

## 2015-04-01 LAB — POCT I-STAT 7, (LYTES, BLD GAS, ICA,H+H)
ACID-BASE DEFICIT: 3 mmol/L — AB (ref 0.0–2.0)
Bicarbonate: 22.6 mEq/L (ref 20.0–24.0)
CALCIUM ION: 0.89 mmol/L — AB (ref 1.13–1.30)
HEMATOCRIT: 26 % — AB (ref 39.0–52.0)
Hemoglobin: 8.8 g/dL — ABNORMAL LOW (ref 13.0–17.0)
O2 SAT: 100 %
PO2 ART: 195 mmHg — AB (ref 80.0–100.0)
Potassium: 4.2 mmol/L (ref 3.5–5.1)
Sodium: 136 mmol/L (ref 135–145)
TCO2: 24 mmol/L (ref 0–100)
pCO2 arterial: 41.4 mmHg (ref 35.0–45.0)
pH, Arterial: 7.346 — ABNORMAL LOW (ref 7.350–7.450)

## 2015-04-01 LAB — POCT I-STAT 4, (NA,K, GLUC, HGB,HCT)
Glucose, Bld: 113 mg/dL — ABNORMAL HIGH (ref 65–99)
HCT: 26 % — ABNORMAL LOW (ref 39.0–52.0)
HEMOGLOBIN: 8.8 g/dL — AB (ref 13.0–17.0)
Potassium: 4 mmol/L (ref 3.5–5.1)
Sodium: 135 mmol/L (ref 135–145)

## 2015-04-01 LAB — PREPARE RBC (CROSSMATCH)

## 2015-04-01 LAB — APTT: APTT: 42 s — AB (ref 24–37)

## 2015-04-01 LAB — HEMOGLOBIN AND HEMATOCRIT, BLOOD
HEMATOCRIT: 24 % — AB (ref 39.0–52.0)
HEMOGLOBIN: 7.8 g/dL — AB (ref 13.0–17.0)

## 2015-04-01 LAB — PROTIME-INR
INR: 1.52 — AB (ref 0.00–1.49)
Prothrombin Time: 18.3 seconds — ABNORMAL HIGH (ref 11.6–15.2)

## 2015-04-01 LAB — PLATELET COUNT: PLATELETS: 68 10*3/uL — AB (ref 150–400)

## 2015-04-01 LAB — SURGICAL PCR SCREEN
MRSA, PCR: NEGATIVE
STAPHYLOCOCCUS AUREUS: NEGATIVE

## 2015-04-01 LAB — MAGNESIUM: Magnesium: 1.9 mg/dL (ref 1.7–2.4)

## 2015-04-01 LAB — POCT I-STAT GLUCOSE
Glucose, Bld: 113 mg/dL — ABNORMAL HIGH (ref 65–99)
OPERATOR ID: 3402

## 2015-04-01 LAB — BLOOD PRODUCT ORDER (VERBAL) VERIFICATION

## 2015-04-01 LAB — ABO/RH: ABO/RH(D): B POS

## 2015-04-01 MED ORDER — HYDROCORTISONE NA SUCCINATE PF 250 MG IJ SOLR
INTRAMUSCULAR | Status: AC
  Filled 2015-04-01: qty 500

## 2015-04-01 MED ORDER — LACTATED RINGERS IV SOLN: 500.0000 mL | Freq: Once | INTRAVENOUS | Status: DC | PRN

## 2015-04-01 MED ORDER — MORPHINE SULFATE (PF) 2 MG/ML IV SOLN
1.0000 mg | INTRAVENOUS | Status: AC | PRN
  Filled 2015-04-01: qty 2

## 2015-04-01 MED ORDER — METOPROLOL TARTRATE 12.5 MG HALF TABLET
12.5000 mg | ORAL_TABLET | Freq: Two times a day (BID) | ORAL | Status: DC
  Administered 2015-04-02: 12.5 mg via ORAL
  Filled 2015-04-01 (×8): qty 1

## 2015-04-01 MED ORDER — HYDRALAZINE HCL 20 MG/ML IJ SOLN
10.0000 mg | INTRAMUSCULAR | Status: DC | PRN
  Administered 2015-04-01 – 2015-04-08 (×9): 10 mg via INTRAVENOUS
  Filled 2015-04-01 (×10): qty 1

## 2015-04-01 MED ORDER — ASPIRIN EC 325 MG PO TBEC
325.0000 mg | DELAYED_RELEASE_TABLET | Freq: Every day | ORAL | Status: DC
  Administered 2015-04-07 – 2015-04-10 (×4): 325 mg via ORAL
  Filled 2015-04-01 (×11): qty 1

## 2015-04-01 MED ORDER — LACTATED RINGERS IV SOLN: INTRAVENOUS | Status: DC

## 2015-04-01 MED ORDER — SODIUM CHLORIDE 0.9 % IJ SOLN: 3.0000 mL | INTRAMUSCULAR | Status: DC | PRN

## 2015-04-01 MED ORDER — SODIUM CHLORIDE 0.45 % IV SOLN
INTRAVENOUS | Status: DC | PRN
  Administered 2015-04-01: 20 mL via INTRAVENOUS
  Administered 2015-04-03: 11:00:00 via INTRAVENOUS

## 2015-04-01 MED ORDER — ARTIFICIAL TEARS OP OINT
TOPICAL_OINTMENT | OPHTHALMIC | Status: AC
  Filled 2015-04-01: qty 3.5

## 2015-04-01 MED ORDER — MORPHINE SULFATE (PF) 2 MG/ML IV SOLN
2.0000 mg | INTRAVENOUS | Status: DC | PRN
  Administered 2015-04-01 – 2015-04-03 (×15): 4 mg via INTRAVENOUS
  Filled 2015-04-01 (×15): qty 2

## 2015-04-01 MED ORDER — PANTOPRAZOLE SODIUM 40 MG PO TBEC
40.0000 mg | DELAYED_RELEASE_TABLET | Freq: Every day | ORAL | Status: DC
  Filled 2015-04-01: qty 1

## 2015-04-01 MED ORDER — HEPARIN SODIUM (PORCINE) 1000 UNIT/ML IJ SOLN
INTRAMUSCULAR | Status: DC | PRN
  Administered 2015-04-01: 35000 [IU] via INTRAVENOUS

## 2015-04-01 MED ORDER — LIDOCAINE HCL (CARDIAC) 20 MG/ML IV SOLN
INTRAVENOUS | Status: AC
  Filled 2015-04-01: qty 10

## 2015-04-01 MED ORDER — TRAMADOL HCL 50 MG PO TABS
50.0000 mg | ORAL_TABLET | ORAL | Status: DC | PRN
  Filled 2015-04-01: qty 2

## 2015-04-01 MED ORDER — PROTAMINE SULFATE 10 MG/ML IV SOLN
INTRAVENOUS | Status: AC
  Filled 2015-04-01: qty 25

## 2015-04-01 MED ORDER — MIDAZOLAM HCL 5 MG/5ML IJ SOLN
INTRAMUSCULAR | Status: DC | PRN
  Administered 2015-04-01: 2 mg via INTRAVENOUS
  Administered 2015-04-01: 1 mg via INTRAVENOUS
  Administered 2015-04-01: 5 mg via INTRAVENOUS
  Administered 2015-04-01: 2 mg via INTRAVENOUS

## 2015-04-01 MED ORDER — THROMBIN 20000 UNITS EX SOLR
CUTANEOUS | Status: AC
  Filled 2015-04-01: qty 20000

## 2015-04-01 MED ORDER — METHYLPREDNISOLONE SODIUM SUCC 125 MG IJ SOLR
INTRAMUSCULAR | Status: DC | PRN
  Administered 2015-04-01: 500 mg via INTRAVENOUS

## 2015-04-01 MED ORDER — FENTANYL CITRATE (PF) 250 MCG/5ML IJ SOLN
INTRAMUSCULAR | Status: AC
  Filled 2015-04-01: qty 5

## 2015-04-01 MED ORDER — ONDANSETRON HCL 4 MG/2ML IJ SOLN
4.0000 mg | Freq: Four times a day (QID) | INTRAMUSCULAR | Status: DC | PRN
  Administered 2015-04-04 – 2015-04-05 (×2): 4 mg via INTRAVENOUS
  Filled 2015-04-01 (×3): qty 2

## 2015-04-01 MED ORDER — CHLORHEXIDINE GLUCONATE 0.12 % MT SOLN
15.0000 mL | Freq: Two times a day (BID) | OROMUCOSAL | Status: DC
  Administered 2015-04-01 – 2015-04-07 (×12): 15 mL via OROMUCOSAL
  Filled 2015-04-01 (×2): qty 15

## 2015-04-01 MED ORDER — SODIUM CHLORIDE 0.9 % IJ SOLN
INTRAMUSCULAR | Status: AC
  Filled 2015-04-01: qty 20

## 2015-04-01 MED ORDER — SODIUM CHLORIDE 0.9 % IJ SOLN
3.0000 mL | Freq: Two times a day (BID) | INTRAMUSCULAR | Status: DC
  Administered 2015-04-02 – 2015-04-04 (×4): 3 mL via INTRAVENOUS
  Administered 2015-04-05: 10 mL via INTRAVENOUS
  Administered 2015-04-05 – 2015-04-12 (×8): 3 mL via INTRAVENOUS

## 2015-04-01 MED ORDER — PHENYLEPHRINE HCL 10 MG/ML IJ SOLN
0.0000 ug/min | INTRAVENOUS | Status: DC
  Filled 2015-04-01: qty 2

## 2015-04-01 MED ORDER — THROMBIN 20000 UNITS EX SOLR
CUTANEOUS | Status: DC | PRN
  Administered 2015-04-01: 20000 [IU] via TOPICAL

## 2015-04-01 MED ORDER — ANTISEPTIC ORAL RINSE SOLUTION (CORINZ)
7.0000 mL | Freq: Four times a day (QID) | OROMUCOSAL | Status: DC
  Administered 2015-04-01 – 2015-04-06 (×18): 7 mL via OROMUCOSAL

## 2015-04-01 MED ORDER — ASPIRIN 81 MG PO CHEW
324.0000 mg | CHEWABLE_TABLET | Freq: Every day | ORAL | Status: DC
  Administered 2015-04-02 – 2015-04-03 (×2): 324 mg
  Filled 2015-04-01 (×5): qty 4

## 2015-04-01 MED ORDER — ACETAMINOPHEN 160 MG/5ML PO SOLN
1000.0000 mg | Freq: Four times a day (QID) | ORAL | Status: DC
  Administered 2015-04-02 – 2015-04-03 (×8): 1000 mg
  Filled 2015-04-01: qty 40.6
  Filled 2015-04-01: qty 40
  Filled 2015-04-01 (×4): qty 40.6

## 2015-04-01 MED ORDER — ALBUMIN HUMAN 5 % IV SOLN
250.0000 mL | INTRAVENOUS | Status: AC | PRN
  Administered 2015-04-01: 250 mL via INTRAVENOUS
  Administered 2015-04-01: 06:00:00 via INTRAVENOUS
  Administered 2015-04-01: 250 mL via INTRAVENOUS
  Filled 2015-04-01: qty 250

## 2015-04-01 MED ORDER — ROCURONIUM BROMIDE 50 MG/5ML IV SOLN
INTRAVENOUS | Status: AC
  Filled 2015-04-01: qty 2

## 2015-04-01 MED ORDER — LEVOFLOXACIN IN D5W 750 MG/150ML IV SOLN
750.0000 mg | INTRAVENOUS | Status: AC
  Administered 2015-04-02: 750 mg via INTRAVENOUS
  Filled 2015-04-01 (×2): qty 150

## 2015-04-01 MED ORDER — MIDAZOLAM HCL 2 MG/2ML IJ SOLN
2.0000 mg | INTRAMUSCULAR | Status: DC | PRN
  Administered 2015-04-01 – 2015-04-04 (×24): 2 mg via INTRAVENOUS
  Filled 2015-04-01 (×26): qty 2

## 2015-04-01 MED ORDER — MAGNESIUM SULFATE 4 GM/100ML IV SOLN
4.0000 g | Freq: Once | INTRAVENOUS | Status: DC
  Filled 2015-04-01: qty 100

## 2015-04-01 MED ORDER — BISACODYL 10 MG RE SUPP
10.0000 mg | Freq: Every day | RECTAL | Status: DC
  Administered 2015-04-02: 10 mg via RECTAL
  Filled 2015-04-01 (×3): qty 1

## 2015-04-01 MED ORDER — SODIUM CHLORIDE 0.9 % IJ SOLN
INTRAMUSCULAR | Status: AC
  Filled 2015-04-01: qty 10

## 2015-04-01 MED ORDER — ROCURONIUM BROMIDE 100 MG/10ML IV SOLN
INTRAVENOUS | Status: DC | PRN
  Administered 2015-04-01: 30 mg via INTRAVENOUS
  Administered 2015-04-01: 20 mg via INTRAVENOUS
  Administered 2015-04-01: 50 mg via INTRAVENOUS
  Administered 2015-04-01: 20 mg via INTRAVENOUS
  Administered 2015-04-01: 30 mg via INTRAVENOUS

## 2015-04-01 MED ORDER — CALCIUM CARBONATE ANTACID 500 MG PO CHEW
1.0000 | CHEWABLE_TABLET | Freq: Two times a day (BID) | ORAL | Status: DC | PRN
  Administered 2015-04-10: 400 mg via ORAL
  Filled 2015-04-01 (×2): qty 2

## 2015-04-01 MED ORDER — ACETAMINOPHEN 160 MG/5ML PO SOLN
650.0000 mg | Freq: Once | ORAL | Status: AC
  Filled 2015-04-01: qty 20.3

## 2015-04-01 MED ORDER — ROCURONIUM BROMIDE 50 MG/5ML IV SOLN
INTRAVENOUS | Status: AC
  Filled 2015-04-01: qty 1

## 2015-04-01 MED ORDER — METOPROLOL TARTRATE 1 MG/ML IV SOLN
2.5000 mg | INTRAVENOUS | Status: DC | PRN
  Administered 2015-04-04 – 2015-04-09 (×12): 5 mg via INTRAVENOUS
  Filled 2015-04-01 (×11): qty 5

## 2015-04-01 MED ORDER — DOCUSATE SODIUM 100 MG PO CAPS
200.0000 mg | ORAL_CAPSULE | Freq: Every day | ORAL | Status: DC
  Administered 2015-04-07 – 2015-04-10 (×4): 200 mg via ORAL
  Filled 2015-04-01 (×6): qty 2

## 2015-04-01 MED ORDER — ETOMIDATE 2 MG/ML IV SOLN
INTRAVENOUS | Status: AC
  Filled 2015-04-01: qty 10

## 2015-04-01 MED ORDER — BISACODYL 5 MG PO TBEC
10.0000 mg | DELAYED_RELEASE_TABLET | Freq: Every day | ORAL | Status: DC
  Administered 2015-04-08 – 2015-04-10 (×3): 10 mg via ORAL
  Filled 2015-04-01 (×5): qty 2

## 2015-04-01 MED ORDER — INSULIN ASPART 100 UNIT/ML ~~LOC~~ SOLN
0.0000 [IU] | SUBCUTANEOUS | Status: DC
  Administered 2015-04-01 – 2015-04-02 (×2): 2 [IU] via SUBCUTANEOUS

## 2015-04-01 MED ORDER — SUCCINYLCHOLINE CHLORIDE 20 MG/ML IJ SOLN
INTRAMUSCULAR | Status: DC | PRN
  Administered 2015-04-01: 120 mg via INTRAVENOUS

## 2015-04-01 MED ORDER — SODIUM CHLORIDE 0.9 % IV SOLN
INTRAVENOUS | Status: DC
  Administered 2015-04-01: 0.5 [IU]/h via INTRAVENOUS
  Filled 2015-04-01: qty 2.5

## 2015-04-01 MED ORDER — POTASSIUM CHLORIDE 10 MEQ/50ML IV SOLN
10.0000 meq | INTRAVENOUS | Status: AC
  Filled 2015-04-01: qty 50

## 2015-04-01 MED ORDER — NITROGLYCERIN IN D5W 200-5 MCG/ML-% IV SOLN
0.0000 ug/min | INTRAVENOUS | Status: DC
  Administered 2015-04-01: 35 ug/min via INTRAVENOUS
  Administered 2015-04-01: 20 ug/min via INTRAVENOUS
  Administered 2015-04-04: 10 ug/min via INTRAVENOUS
  Administered 2015-04-05: 20 ug/min via INTRAVENOUS
  Filled 2015-04-01 (×4): qty 250

## 2015-04-01 MED ORDER — CALCIUM CARBONATE ANTACID 500 MG PO CHEW
800.0000 mg | CHEWABLE_TABLET | Freq: Two times a day (BID) | ORAL | Status: DC
  Administered 2015-04-01 – 2015-04-09 (×5): 800 mg via ORAL
  Filled 2015-04-01 (×20): qty 4

## 2015-04-01 MED ORDER — LEVOFLOXACIN IN D5W 500 MG/100ML IV SOLN
500.0000 mg | INTRAVENOUS | Status: AC
  Administered 2015-04-01: 500 mg via INTRAVENOUS
  Filled 2015-04-01: qty 100

## 2015-04-01 MED ORDER — 0.9 % SODIUM CHLORIDE (POUR BTL) OPTIME
TOPICAL | Status: DC | PRN
  Administered 2015-04-01: 6000 mL

## 2015-04-01 MED ORDER — VANCOMYCIN HCL IN DEXTROSE 1-5 GM/200ML-% IV SOLN
1000.0000 mg | Freq: Once | INTRAVENOUS | Status: AC
  Administered 2015-04-01: 1000 mg via INTRAVENOUS
  Filled 2015-04-01 (×2): qty 200

## 2015-04-01 MED ORDER — SODIUM CHLORIDE 0.9 % IV SOLN: 250.0000 mL | INTRAVENOUS | Status: DC

## 2015-04-01 MED ORDER — HEMOSTATIC AGENTS (NO CHARGE) OPTIME
TOPICAL | Status: DC | PRN
  Administered 2015-04-01 (×2): 1 via TOPICAL

## 2015-04-01 MED ORDER — SODIUM CHLORIDE 0.9 % IV SOLN
Freq: Once | INTRAVENOUS | Status: AC
  Administered 2015-04-01: 15:00:00 via INTRAVENOUS

## 2015-04-01 MED ORDER — SODIUM CHLORIDE 0.9 % IV SOLN
INTRAVENOUS | Status: DC
  Administered 2015-04-01 – 2015-04-03 (×2): via INTRAVENOUS

## 2015-04-01 MED ORDER — OXYCODONE HCL 5 MG PO TABS: 5.0000 mg | ORAL_TABLET | ORAL | Status: DC | PRN

## 2015-04-01 MED ORDER — ACETAMINOPHEN 500 MG PO TABS
1000.0000 mg | ORAL_TABLET | Freq: Four times a day (QID) | ORAL | Status: DC
  Filled 2015-04-01 (×17): qty 2

## 2015-04-01 MED ORDER — ETOMIDATE 2 MG/ML IV SOLN
INTRAVENOUS | Status: DC | PRN
  Administered 2015-04-01: 20 mg via INTRAVENOUS

## 2015-04-01 MED ORDER — SODIUM CHLORIDE 0.9 % IV SOLN
INTRAVENOUS | Status: DC
  Filled 2015-04-01: qty 40

## 2015-04-01 MED ORDER — ACETAMINOPHEN 650 MG RE SUPP
650.0000 mg | Freq: Once | RECTAL | Status: AC
  Administered 2015-04-01: 650 mg via RECTAL
  Filled 2015-04-01: qty 1

## 2015-04-01 MED ORDER — NITROPRUSSIDE SODIUM 25 MG/ML IV SOLN
0.0000 ug/kg/min | INTRAVENOUS | Status: DC
  Filled 2015-04-01: qty 2

## 2015-04-01 MED ORDER — FAMOTIDINE IN NACL 20-0.9 MG/50ML-% IV SOLN
20.0000 mg | Freq: Two times a day (BID) | INTRAVENOUS | Status: AC
  Administered 2015-04-01 (×2): 20 mg via INTRAVENOUS
  Filled 2015-04-01 (×2): qty 50

## 2015-04-01 MED ORDER — DEXMEDETOMIDINE HCL IN NACL 200 MCG/50ML IV SOLN
0.0000 ug/kg/h | INTRAVENOUS | Status: DC
Start: 2015-04-01 — End: 2015-04-02
  Administered 2015-04-01 – 2015-04-02 (×9): 0.7 ug/kg/h via INTRAVENOUS
  Administered 2015-04-02: 0.06 ug/kg/h via INTRAVENOUS
  Filled 2015-04-01 (×11): qty 50

## 2015-04-01 MED ORDER — DEXTROSE 5 % IV SOLN
50000.0000 ug | INTRAVENOUS | Status: DC | PRN
  Administered 2015-04-01: 1 ug/kg/min via INTRAVENOUS

## 2015-04-01 MED ORDER — INSULIN REGULAR BOLUS VIA INFUSION
0.0000 [IU] | Freq: Three times a day (TID) | INTRAVENOUS | Status: DC
  Filled 2015-04-01: qty 10

## 2015-04-01 MED ORDER — PROPOFOL 10 MG/ML IV BOLUS
INTRAVENOUS | Status: DC | PRN
  Administered 2015-04-01: 50 mg via INTRAVENOUS

## 2015-04-01 MED ORDER — PROTAMINE SULFATE 10 MG/ML IV SOLN
INTRAVENOUS | Status: DC | PRN
  Administered 2015-04-01: 30 mg via INTRAVENOUS
  Administered 2015-04-01 (×3): 50 mg via INTRAVENOUS
  Administered 2015-04-01 (×2): 30 mg via INTRAVENOUS
  Administered 2015-04-01: 60 mg via INTRAVENOUS

## 2015-04-01 MED ORDER — METOPROLOL TARTRATE 25 MG/10 ML ORAL SUSPENSION
12.5000 mg | Freq: Two times a day (BID) | ORAL | Status: DC
  Administered 2015-04-01 – 2015-04-02 (×3): 12.5 mg
  Filled 2015-04-01 (×8): qty 5

## 2015-04-01 MED ORDER — THROMBIN 20000 UNITS EX SOLR
OROMUCOSAL | Status: DC | PRN
  Administered 2015-04-01 (×3): 4 mL via TOPICAL

## 2015-04-01 MED ORDER — ARTIFICIAL TEARS OP OINT
TOPICAL_OINTMENT | OPHTHALMIC | Status: DC | PRN
  Administered 2015-04-01: 1 via OPHTHALMIC

## 2015-04-01 MED FILL — Electrolyte-R (PH 7.4) Solution: INTRAVENOUS | Qty: 3000 | Status: AC

## 2015-04-01 MED FILL — Sodium Bicarbonate IV Soln 8.4%: INTRAVENOUS | Qty: 50 | Status: AC

## 2015-04-01 MED FILL — Lidocaine HCl IV Inj 20 MG/ML: INTRAVENOUS | Qty: 10 | Status: AC

## 2015-04-01 MED FILL — Sodium Chloride IV Soln 0.9%: INTRAVENOUS | Qty: 50 | Status: AC

## 2015-04-01 MED FILL — Mannitol IV Soln 20%: INTRAVENOUS | Qty: 500 | Status: AC

## 2015-04-01 MED FILL — Heparin Sodium (Porcine) Inj 1000 Unit/ML: INTRAMUSCULAR | Qty: 10 | Status: AC

## 2015-04-01 MED FILL — Sodium Chloride IV Soln 0.9%: INTRAVENOUS | Qty: 4000 | Status: AC

## 2015-04-01 MED FILL — Bivalirudin For IV Soln 250 MG: INTRAVENOUS | Qty: 250 | Status: AC

## 2015-04-01 NOTE — Progress Notes (Signed)
Woke up wild, MOE x 4 does not follow commands, bucking ventilator.

## 2015-04-01 NOTE — Progress Notes (Signed)
Dr. Cyndia Bent called and gave orders to DC Nipride and order hydralazine Q4 for systolic BP >580.  Rowe Pavy, RN

## 2015-04-01 NOTE — Care Management Note (Signed)
Case Management Note  Patient Details  Name: Matthew Kane MRN: [id number] Date of Birth: 1951/04/29  Subjective/Objective:    Pt admitted with Type A Aortic dissection - to OR for urgent repair- post op Vent and NTG gtt                Action/Plan:  PTA pt from home- NCM to follow progression for d/c needs  Expected Discharge Date:                  Expected Discharge Plan:  Home/Self Care  In-House Referral:     Discharge planning Services  CM Consult  Post Acute Care Choice:    Choice offered to:     DME Arranged:    DME Agency:     HH Arranged:    HH Agency:     Status of Service:  In process, will continue to follow  Medicare Important Message Given:    Date Medicare IM Given:    Medicare IM give by:    Date Additional Medicare IM Given:    Additional Medicare Important Message give by:     If discussed at [place] of Stay Meetings, dates discussed:    Additional Comments:  Dawayne Patricia, RN 04/01/2015, 11:00 AM

## 2015-04-01 NOTE — OR Nursing (Signed)
One hour call to SICU charge nurse at [phone].

## 2015-04-01 NOTE — Progress Notes (Signed)
Dr. Cyndia Bent notified of hemoglobin 7.5, order to transfuse one unit.  Rowe Pavy, RN

## 2015-04-01 NOTE — Transfer of Care (Signed)
Immediate Anesthesia Transfer of Care Note  Patient: Matthew Kane  Procedure(s) Performed: Procedure(s): REPAIR OF ASCENDING AORTIC DISSECTION, HYPOTHERMIC CIRCULATORY ARREST, RIGHT AXILLARY CANNULATION (N/A)  Patient Location: SICU  Anesthesia Type:General  Level of Consciousness: Patient remains intubated per anesthesia plan  Airway & Oxygen Therapy: Patient remains intubated per anesthesia plan and Patient placed on Ventilator (see vital sign flow sheet for setting)  Post-op Assessment: Post -op Vital signs reviewed and stable  Post vital signs: stable  Last Vitals:  Filed Vitals:   04/01/15 0000  BP: 184/80  Pulse: 67  Temp:   Resp: 16    Complications: No apparent anesthesia complications

## 2015-04-01 NOTE — Anesthesia Preprocedure Evaluation (Signed)
Anesthesia Evaluation  Patient identified by MRN, date of birth, ID band Patient awake    Reviewed: Allergy & Precautions, NPO status , Patient's Chart, lab work & pertinent test results, reviewed documented beta blocker date and time , Unable to perform ROS - Chart review onlyPreop documentation limited or incomplete due to emergent nature of procedure.  History of Anesthesia Complications (+) DIFFICULT AIRWAY  Airway Mallampati: III  TM Distance: >3 FB Neck ROM: Full  Mouth opening: Limited Mouth Opening  Dental  (+) Teeth Intact, Dental Advisory Given   Pulmonary sleep apnea and Continuous Positive Airway Pressure Ventilation , former smoker,  breath sounds clear to auscultation        Cardiovascular hypertension, Pt. on medications and Pt. on home beta blockers Rhythm:Regular Rate:Normal     Neuro/Psych    GI/Hepatic GERD-  Medicated and Controlled,  Endo/Other    Renal/GU Dialysis and ESRFRenal disease     Musculoskeletal   Abdominal   Peds  Hematology   Anesthesia Other Findings Combination of large teeth to mouth ratio and large tongue make airway visualization difficult.  Reproductive/Obstetrics                             Anesthesia Physical Anesthesia Plan  ASA: IV and emergent  Anesthesia Plan: General   Post-op Pain Management:    Induction: Intravenous  Airway Management Planned: Oral ETT  Additional Equipment: Arterial line, PA Cath, Ultrasound Guidance Line Placement and TEE  Intra-op Plan: Delibrate Circulatory arrest per surgeon request  Post-operative Plan: Post-operative intubation/ventilation  Informed Consent: I have reviewed the patients History and Physical, chart, labs and discussed the procedure including the risks, benefits and alternatives for the proposed anesthesia with the patient or authorized representative who has indicated his/her understanding and  acceptance.     Plan Discussed with: CRNA, Anesthesiologist and Surgeon  Anesthesia Plan Comments:         Anesthesia Quick Evaluation

## 2015-04-01 NOTE — Anesthesia Postprocedure Evaluation (Signed)
  Anesthesia Post-op Note  Patient: Matthew Kane  Procedure(s) Performed: Procedure(s): REPAIR OF ASCENDING AORTIC DISSECTION, HYPOTHERMIC CIRCULATORY ARREST, RIGHT AXILLARY CANNULATION (N/A)  Patient Location: SICU  Anesthesia Type:General  Level of Consciousness: Patient remains intubated per anesthesia plan  Airway and Oxygen Therapy: Patient remains intubated per anesthesia plan and Patient placed on Ventilator (see vital sign flow sheet for setting)  Post-op Pain: none  Post-op Assessment: Post-op Vital signs reviewed              Post-op Vital Signs: Reviewed  Last Vitals:  Filed Vitals:   04/01/15 1315  BP:   Pulse: 79  Temp: 36.3 C  Resp: 14    Complications: No apparent anesthesia complications

## 2015-04-01 NOTE — Anesthesia Procedure Notes (Signed)
Procedures    1117: [place] of brachial artery on Right with needle in place. 0105: [place] of Left IJ Vein for placement of introducer

## 2015-04-01 NOTE — Consult Note (Signed)
Reason for Consult: To manage dialysis and dialysis related needs Referring Physician: [name] is an 64 y.o. male with HTN- malignant and poorly controlled- ESRD TTS at [facility], GERD.  He has been compliant with his dialysis treatments but blood pressure had been high- suspecting noncompliance with his 4 drug BP regimen.  He presented to ER at [facility] with CP on 8/24- evaluation found malignant HTN and a type A aortic dissection so had to go to the OR for repair early this AM- curretnly intubated in [place] on nitroprusside drip as well as ntg and sedation.  Today is his HD day    Dialyzes at [place] 88. HD [place] 2 K/2 ca, Dialyzer 180, Heparin yes. Access PC. Calcitriol 1 mcg per tx, heparin 10,000 per tx, mircera 75- given 8/23 also in the midst of venofer 100 per tx for 5  Past Medical History  Diagnosis Date  . Hypertension     Echo in 10/2006-moderate LVH, normal EF  . Obesity   . Gout   . Gastroesophageal reflux disease   . Erectile dysfunction   . Prostatic hypertrophy, benign   . Allergic rhinitis   . Chronic kidney disease, stage 5, kidney failure      dialysis at [facility] T/TH/Sat; ESRD as of 05/2011; leg cramps; hypokalemia; microcytosis  . Secondary hyperparathyroidism of renal origin   . Anemia   . Dialysis patient   . History of blood transfusion   . Thrombocytopenia   . Elevated troponin   . Obstructive sleep apnea 2008    2008- uses CPAP  . Difficult intubation     Spasms after LMA inserted -12/13/10    Past Surgical History  Procedure Laterality Date  . Total hip arthroplasty      bilateral  . Portacath placement    . Cholecystectomy    . Finger amputation      right middle  . Colonoscopy  07/12/2011    Procedure: COLONOSCOPY;  Surgeon: Dorothyann Peng, MD;  [facility]: [facility];  Service: Endoscopy;  Laterality: N/A;  1:00  . Av fistula placement  05-31-11    Right radiocephalic AVF  . Colostomy  Pt denies having this surgery -  (12/17/14.    + subsequent takedown secondary to colonic rupture related to trauma  . Joint replacement      times 3  . Av fistula placement  04/01/2012    Procedure: INSERTION OF ARTERIOVENOUS (AV) GORE-TEX GRAFT ARM;  Surgeon: Elam Dutch, MD;  Location: [facility] OR;  Service: Vascular;  Laterality: Left;  . Revison of arteriovenous fistula Left 05/06/2014    Procedure: REVISON OF ARTERIOVENOUS GRAFT;  Surgeon: Conrad Point Roberts, MD;  Location: [place];  Service: Vascular;  Laterality: Left;  . Shuntogram N/A 10/06/2011    Procedure: Earney Mallet;  Surgeon: Elam Dutch, MD;  Location: [facility] CATH LAB;  Service: Cardiovascular;  Laterality: N/A;  . Insertion of dialysis catheter Right 12/18/2014    Procedure: ULTRASOUND GUIDED INSERTION OF RIGHT INTERNAL JUGULAR DIALYSIS CATHETER;  Surgeon: Angelia Mould, MD;  Location: [place];  Service: Vascular;  Laterality: Right;  . Hematoma evacuation Left 12/18/2014    Procedure: EVACUATION OF LEFT ARM LYMPHOCELE;  Surgeon: Angelia Mould, MD;  Location: [place];  Service: Vascular;  Laterality: Left;  . Av fistula placement Left 12/18/2014    Procedure: REVISION OF LEFT ARM ARTERIOVENOUS (AV) GORE-TEX GRAFT WITH INTERPOSITION OF 25m GRAFT;  Surgeon: CAngelia Mould  MD;  Location: [place];  Service: Vascular;  Laterality: Left;  . Avgg removal Left 02/19/2015    Procedure: LIGATION OF ARTERIOVENOUS GORETEX GRAFT LEFT FOREARM;  Surgeon: Angelia Mould, MD;  Location: [place];  Service: Vascular;  Laterality: Left;  . Hematoma evacuation Left 02/19/2015    Procedure: EVACUATION LYMPHOCELE LEFT FOREARM;  Surgeon: Angelia Mould, MD;  Location: [place];  Service: Vascular;  Laterality: Left;  . Cardiac catheterization N/A 03/19/2015    Procedure: Left Heart Cath and Coronary Angiography;  Surgeon: Burnell Blanks, MD;  Location: [place] CV LAB;  Service: Cardiovascular;  Laterality: N/A;  . Ascending aortic root replacement N/A 03/23/2015     Procedure: REPAIR OF ASCENDING AORTIC DISSECTION, HYPOTHERMIC CIRCULATORY ARREST, RIGHT AXILLARY CANNULATION;  Surgeon: Gaye Pollack, MD;  Location: [place];  Service: Open Heart Surgery;  Laterality: N/A;    Family History  Problem Relation Age of Onset  . Lung cancer Father 60    hepatic metastases  . Heart attack Mother 32  . Cancer Brother     unk    Social History:  reports that he quit smoking about 40 years ago. His smoking use included Cigarettes. He has a 10 pack-year smoking history. He has never used smokeless tobacco. He reports that he does not drink alcohol or use illicit drugs.  Allergies:  Allergies  Allergen Reactions  . Penicillins Itching and Rash    Medications: I have reviewed the patient's current medications.   Results for orders placed or performed during the hospital encounter of 03/16/2015 (from the past 48 hour(s))  Basic metabolic panel     Status: Abnormal   Collection Time: 04/07/2015  5:15 PM  Result Value Ref Range   Sodium 140 135 - 145 mmol/L   Potassium 3.7 3.5 - 5.1 mmol/L   Chloride 101 101 - 111 mmol/L   CO2 26 22 - 32 mmol/L   Glucose, Bld 105 (H) 65 - 99 mg/dL   BUN 29 (H) 6 - 20 mg/dL   Creatinine, Ser 9.83 (H) 0.61 - 1.24 mg/dL   Calcium 9.1 8.9 - 10.3 mg/dL   GFR calc non Af Amer 5 (L) >60 mL/min   GFR calc Af Amer 6 (L) >60 mL/min    Comment: (NOTE) The eGFR has been calculated using the CKD EPI equation. This calculation has not been validated in all clinical situations. eGFR's persistently <60 mL/min signify possible Chronic Kidney Disease.    Anion gap 13 5 - 15  CBC     Status: Abnormal   Collection Time: 03/18/2015  5:15 PM  Result Value Ref Range   WBC 10.7 (H) 4.0 - 10.5 K/uL   RBC 4.36 4.22 - 5.81 MIL/uL   Hemoglobin 11.4 (L) 13.0 - 17.0 g/dL   HCT 35.5 (L) 39.0 - 52.0 %   MCV 81.4 78.0 - 100.0 fL   MCH 26.1 26.0 - 34.0 pg   MCHC 32.1 30.0 - 36.0 g/dL   RDW 17.8 (H) 11.5 - 15.5 %   Platelets 148 (L) 150 - 400 K/uL     Comment: SPECIMEN CHECKED FOR CLOTS PLATELET COUNT CONFIRMED BY SMEAR LARGE PLATELETS PRESENT   Troponin I     Status: Abnormal   Collection Time: 04/04/2015  5:15 PM  Result Value Ref Range   Troponin I 0.15 (H) <0.031 ng/mL    Comment:        PERSISTENTLY INCREASED TROPONIN VALUES IN THE RANGE OF 0.04-0.49 ng/mL CAN BE  SEEN IN:       -UNSTABLE ANGINA       -CONGESTIVE HEART FAILURE       -MYOCARDITIS       -CHEST TRAUMA       -ARRYHTHMIAS       -LATE PRESENTING MYOCARDIAL INFARCTION       -COPD   CLINICAL FOLLOW-UP RECOMMENDED.   I-stat troponin, ED     Status: Abnormal   Collection Time: 03/14/2015  5:58 PM  Result Value Ref Range   Troponin i, poc 0.09 (HH) 0.00 - 0.08 ng/mL   Comment NOTIFIED PHYSICIAN    Comment 3            Comment: Due to the release kinetics of cTnI, a negative result within the first hours of the onset of symptoms does not rule out myocardial infarction with certainty. If myocardial infarction is still suspected, repeat the test at appropriate intervals.   MRSA PCR Screening     Status: None   Collection Time: 03/15/2015  8:33 PM  Result Value Ref Range   MRSA by PCR NEGATIVE NEGATIVE    Comment:        The GeneXpert MRSA Assay (FDA approved for NASAL specimens only), is one component of a comprehensive MRSA colonization surveillance program. It is not intended to diagnose MRSA infection nor to guide or monitor treatment for MRSA infections.   Blood gas, arterial     Status: Abnormal   Collection Time: 03/30/2015 11:51 PM  Result Value Ref Range   FIO2 0.21    pH, Arterial 7.384 7.350 - 7.450   pCO2 arterial 42.4 35.0 - 45.0 mmHg   pO2, Arterial 70.6 (L) 80.0 - 100.0 mmHg   Bicarbonate 24.8 (H) 20.0 - 24.0 mEq/L   TCO2 26.1 0 - 100 mmol/L   Acid-Base Excess 0.3 0.0 - 2.0 mmol/L   O2 Saturation 91.8 %   Patient temperature 98.6    Collection site RIGHT RADIAL    Drawn by [phone]    Sample type ARTERIAL    Allens test (pass/fail)  PASS PASS  CBC     Status: Abnormal   Collection Time: 04/01/15 12:05 AM  Result Value Ref Range   WBC 8.5 4.0 - 10.5 K/uL   RBC 3.99 (L) 4.22 - 5.81 MIL/uL   Hemoglobin 10.7 (L) 13.0 - 17.0 g/dL   HCT 31.9 (L) 39.0 - 52.0 %   MCV 79.9 78.0 - 100.0 fL   MCH 26.8 26.0 - 34.0 pg   MCHC 33.5 30.0 - 36.0 g/dL   RDW 18.0 (H) 11.5 - 15.5 %   Platelets 102 (L) 150 - 400 K/uL    Comment: REPEATED TO VERIFY SPECIMEN CHECKED FOR CLOTS PLATELET COUNT CONFIRMED BY SMEAR   Basic metabolic panel     Status: Abnormal   Collection Time: 04/01/15 12:05 AM  Result Value Ref Range   Sodium 136 135 - 145 mmol/L   Potassium 3.5 3.5 - 5.1 mmol/L   Chloride 99 (L) 101 - 111 mmol/L   CO2 26 22 - 32 mmol/L   Glucose, Bld 120 (H) 65 - 99 mg/dL   BUN 31 (H) 6 - 20 mg/dL   Creatinine, Ser 9.90 (H) 0.61 - 1.24 mg/dL   Calcium 8.7 (L) 8.9 - 10.3 mg/dL   GFR calc non Af Amer 5 (L) >60 mL/min   GFR calc Af Amer 6 (L) >60 mL/min    Comment: (NOTE) The eGFR has been calculated using the CKD EPI  equation. This calculation has not been validated in all clinical situations. eGFR's persistently <60 mL/min signify possible Chronic Kidney Disease.    Anion gap 11 5 - 15  Type and screen     Status: None (Preliminary result)   Collection Time: 04/01/15  1:00 AM  Result Value Ref Range   ABO/RH(D) B POS    Antibody Screen NEG    Sample Expiration 04/04/2015    Unit Number [id number]    Blood Component Type RED CELLS,LR    Unit division 00    Status of Unit ISSUED    Transfusion Status OK TO TRANSFUSE    Crossmatch Result Compatible    Unit Number [id number]    Blood Component Type RED CELLS,LR    Unit division 00    Status of Unit ALLOCATED    Transfusion Status OK TO TRANSFUSE    Crossmatch Result Compatible    Unit Number [id number]    Blood Component Type RED CELLS,LR    Unit division 00    Status of Unit ALLOCATED    Transfusion Status OK TO TRANSFUSE    Crossmatch Result Compatible     Unit Number [id number]    Blood Component Type RED CELLS,LR    Unit division 00    Status of Unit REL FROM [facility]    Transfusion Status OK TO TRANSFUSE    Crossmatch Result Compatible    Unit Number [id number]    Blood Component Type RED CELLS,LR    Unit division 00    Status of Unit ALLOCATED    Transfusion Status OK TO TRANSFUSE    Crossmatch Result Compatible    Unit Number [id number]    Blood Component Type RED CELLS,LR    Unit division 00    Status of Unit ISSUED    Transfusion Status OK TO TRANSFUSE    Crossmatch Result Compatible   ABO/Rh     Status: None   Collection Time: 04/01/15  1:00 AM  Result Value Ref Range   ABO/RH(D) B POS   Surgical pcr screen     Status: None   Collection Time: 04/01/15  1:06 AM  Result Value Ref Range   MRSA, PCR NEGATIVE NEGATIVE   Staphylococcus aureus NEGATIVE NEGATIVE    Comment:        The Xpert SA Assay (FDA approved for NASAL specimens in patients over 49 years of age), is one component of a comprehensive surveillance program.  Test performance has been validated by [facility] for patients greater than or equal to 66 year old. It is not intended to diagnose infection nor to guide or monitor treatment.   I-STAT, chem 8     Status: Abnormal   Collection Time: 04/01/15  1:37 AM  Result Value Ref Range   Sodium 136 135 - 145 mmol/L   Potassium 3.8 3.5 - 5.1 mmol/L   Chloride 98 (L) 101 - 111 mmol/L   BUN 31 (H) 6 - 20 mg/dL   Creatinine, Ser 9.60 (H) 0.61 - 1.24 mg/dL   Glucose, Bld 124 (H) 65 - 99 mg/dL   Calcium, Ion 1.14 1.13 - 1.30 mmol/L   TCO2 26 0 - 100 mmol/L   Hemoglobin 11.2 (L) 13.0 - 17.0 g/dL   HCT 33.0 (L) 39.0 - 52.0 %  I-STAT glucose     Status: Abnormal   Collection Time: 04/01/15  3:07 AM  Result Value Ref Range   Operator id [id number]    Glucose, Bld 113 (H) 65 - 99  mg/dL  I-STAT, chem 8     Status: Abnormal   Collection Time: 04/01/15  3:18 AM  Result Value Ref Range   Sodium 136 135 - 145  mmol/L   Potassium 3.8 3.5 - 5.1 mmol/L   Chloride 100 (L) 101 - 111 mmol/L   BUN 30 (H) 6 - 20 mg/dL   Creatinine, Ser 9.90 (H) 0.61 - 1.24 mg/dL   Glucose, Bld 103 (H) 65 - 99 mg/dL   Calcium, Ion 1.11 (L) 1.13 - 1.30 mmol/L   TCO2 23 0 - 100 mmol/L   Hemoglobin 8.8 (L) 13.0 - 17.0 g/dL   HCT 26.0 (L) 39.0 - 52.0 %  I-STAT 3, arterial blood gas (G3+)     Status: Abnormal   Collection Time: 04/01/15  4:03 AM  Result Value Ref Range   pH, Arterial 7.449 7.350 - 7.450   pCO2 arterial 39.1 35.0 - 45.0 mmHg   pO2, Arterial 416.0 (H) 80.0 - 100.0 mmHg   Bicarbonate 27.1 (H) 20.0 - 24.0 mEq/L   TCO2 28 0 - 100 mmol/L   O2 Saturation 100.0 %   Acid-Base Excess 3.0 (H) 0.0 - 2.0 mmol/L   Sample type ARTERIAL   I-STAT, chem 8     Status: Abnormal   Collection Time: 04/01/15  4:06 AM  Result Value Ref Range   Sodium 136 135 - 145 mmol/L   Potassium 3.6 3.5 - 5.1 mmol/L   Chloride 98 (L) 101 - 111 mmol/L   BUN 29 (H) 6 - 20 mg/dL   Creatinine, Ser 9.10 (H) 0.61 - 1.24 mg/dL   Glucose, Bld 73 65 - 99 mg/dL   Calcium, Ion 1.05 (L) 1.13 - 1.30 mmol/L   TCO2 26 0 - 100 mmol/L   Hemoglobin 7.8 (L) 13.0 - 17.0 g/dL   HCT 23.0 (L) 39.0 - 52.0 %  I-STAT, chem 8     Status: Abnormal   Collection Time: 04/01/15  5:19 AM  Result Value Ref Range   Sodium 131 (L) 135 - 145 mmol/L   Potassium 3.6 3.5 - 5.1 mmol/L   Chloride 99 (L) 101 - 111 mmol/L   BUN 30 (H) 6 - 20 mg/dL   Creatinine, Ser 8.90 (H) 0.61 - 1.24 mg/dL   Glucose, Bld 105 (H) 65 - 99 mg/dL   Calcium, Ion 1.03 (L) 1.13 - 1.30 mmol/L   TCO2 22 0 - 100 mmol/L   Hemoglobin 7.8 (L) 13.0 - 17.0 g/dL   HCT 23.0 (L) 39.0 - 52.0 %  Hemoglobin and hematocrit, blood     Status: Abnormal   Collection Time: 04/01/15  5:30 AM  Result Value Ref Range   Hemoglobin 7.8 (L) 13.0 - 17.0 g/dL    Comment: REPEATED TO VERIFY RESULT CALLED TO, READ BACK BY AND VERIFIED WITHMechele Dawley RN [phone] 04/01/2015 BY MACEDA, J    HCT 24.0 (L) 39.0 -  52.0 %    Comment: REPEATED TO VERIFY RESULT CALLED TO, READ BACK BY AND VERIFIED WITH: Mechele Dawley RN [id number] 04/01/2015 BY MACEDA, J   Platelet count     Status: Abnormal   Collection Time: 04/01/15  5:30 AM  Result Value Ref Range   Platelets 68 (L) 150 - 400 K/uL    Comment: SPECIMEN CHECKED FOR CLOTS REPEATED TO VERIFY RESULT CALLED TO, READ BACK BY AND VERIFIED WITH: Carola Frost RN [phone] 04/01/2015 BY MACEDA, J   Prepare fresh frozen plasma     Status: None (Preliminary result)  Collection Time: 04/01/15  6:00 AM  Result Value Ref Range   Unit Number [id number]    Blood Component Type THAWED PLASMA    Unit division 00    Status of Unit ISSUED    Transfusion Status OK TO TRANSFUSE    Unit Number [id number]    Blood Component Type THAWED PLASMA    Unit division 00    Status of Unit ISSUED    Transfusion Status OK TO TRANSFUSE    Unit Number [id number]    Blood Component Type THAWED PLASMA    Unit division 00    Status of Unit ISSUED    Transfusion Status OK TO TRANSFUSE   Prepare cryoprecipitate     Status: None (Preliminary result)   Collection Time: 04/01/15  6:00 AM  Result Value Ref Range   Unit Number [id number]    Blood Component Type CRYPOOL THAW    Unit division 00    Status of Unit ISSUED    Transfusion Status OK TO TRANSFUSE   Prepare platelet pheresis     Status: None (Preliminary result)   Collection Time: 04/01/15  6:00 AM  Result Value Ref Range   Unit Number [id number]    Blood Component Type PLTP LR1 PAS    Unit division 00    Status of Unit ISSUED    Transfusion Status OK TO TRANSFUSE   I-STAT, chem 8     Status: Abnormal   Collection Time: 04/01/15  6:35 AM  Result Value Ref Range   Sodium 133 (L) 135 - 145 mmol/L   Potassium 3.9 3.5 - 5.1 mmol/L   Chloride 100 (L) 101 - 111 mmol/L   BUN 31 (H) 6 - 20 mg/dL   Creatinine, Ser 8.80 (H) 0.61 - 1.24 mg/dL   Glucose, Bld 115 (H) 65 - 99 mg/dL   Calcium, Ion 1.02 (L) 1.13 -  1.30 mmol/L   TCO2 20 0 - 100 mmol/L   Hemoglobin 7.8 (L) 13.0 - 17.0 g/dL   HCT 23.0 (L) 39.0 - 52.0 %  Prepare platelet pheresis     Status: None (Preliminary result)   Collection Time: 04/01/15  7:18 AM  Result Value Ref Range   Unit Number [id number]    Blood Component Type PLTP LR1 PAS    Unit division 00    Status of Unit ISSUED    Transfusion Status OK TO TRANSFUSE   Provider-confirm verbal Blood Bank order - Platelet Pheresis, FFP, Cryoprecipitate; Order taken: 04/01/2015; 5:45 AM; Surgery 2 Platelet, 1 Cryo, 3 FFP     Status: None   Collection Time: 04/01/15  7:56 AM  Result Value Ref Range   Blood product order confirm MD AUTHORIZATION REQUESTED   I-STAT 3, arterial blood gas (G3+)     Status: Abnormal   Collection Time: 04/01/15  8:23 AM  Result Value Ref Range   pH, Arterial 7.401 7.350 - 7.450   pCO2 arterial 33.8 (L) 35.0 - 45.0 mmHg   pO2, Arterial 58.0 (L) 80.0 - 100.0 mmHg   Bicarbonate 21.7 20.0 - 24.0 mEq/L   TCO2 23 0 - 100 mmol/L   O2 Saturation 93.0 %   Acid-base deficit 3.0 (H) 0.0 - 2.0 mmol/L   Patient temperature 34.1 C    Collection site ARTERIAL LINE    Drawn by Nurse    Sample type ARTERIAL   I-STAT 4, (NA,K, GLUC, HGB,HCT)     Status: Abnormal   Collection Time: 04/01/15  8:24 AM  Result Value Ref  Range   Sodium 135 135 - 145 mmol/L   Potassium 4.0 3.5 - 5.1 mmol/L   Glucose, Bld 113 (H) 65 - 99 mg/dL   HCT 26.0 (L) 39.0 - 52.0 %   Hemoglobin 8.8 (L) 13.0 - 17.0 g/dL  CBC     Status: Abnormal   Collection Time: 04/01/15  8:26 AM  Result Value Ref Range   WBC 6.9 4.0 - 10.5 K/uL   RBC 3.13 (L) 4.22 - 5.81 MIL/uL   Hemoglobin 8.3 (L) 13.0 - 17.0 g/dL   HCT 25.0 (L) 39.0 - 52.0 %   MCV 79.9 78.0 - 100.0 fL   MCH 26.5 26.0 - 34.0 pg   MCHC 33.2 30.0 - 36.0 g/dL   RDW 17.7 (H) 11.5 - 15.5 %   Platelets 97 (L) 150 - 400 K/uL    Comment: CONSISTENT WITH PREVIOUS RESULT  Protime-INR     Status: Abnormal   Collection Time: 04/01/15  8:26 AM   Result Value Ref Range   Prothrombin Time 18.3 (H) 11.6 - 15.2 seconds   INR 1.52 (H) 0.00 - 1.49  APTT     Status: Abnormal   Collection Time: 04/01/15  8:26 AM  Result Value Ref Range   aPTT 42 (H) 24 - 37 seconds    Comment:        IF BASELINE aPTT IS ELEVATED, SUGGEST PATIENT RISK ASSESSMENT BE USED TO DETERMINE APPROPRIATE ANTICOAGULANT THERAPY.   Fibrinogen     Status: None   Collection Time: 04/01/15  8:37 AM  Result Value Ref Range   Fibrinogen 207 204 - 475 mg/dL  Glucose, capillary     Status: Abnormal   Collection Time: 04/01/15  9:30 AM  Result Value Ref Range   Glucose-Capillary 108 (H) 65 - 99 mg/dL   Comment 1 Arterial Specimen   Glucose, capillary     Status: Abnormal   Collection Time: 04/01/15 10:28 AM  Result Value Ref Range   Glucose-Capillary 116 (H) 65 - 99 mg/dL  Glucose, capillary     Status: Abnormal   Collection Time: 04/01/15 11:31 AM  Result Value Ref Range   Glucose-Capillary 111 (H) 65 - 99 mg/dL   Comment 1 Capillary Specimen    Comment 2 Notify RN   CBC     Status: Abnormal   Collection Time: 04/01/15  1:45 PM  Result Value Ref Range   WBC 6.7 4.0 - 10.5 K/uL   RBC 2.89 (L) 4.22 - 5.81 MIL/uL   Hemoglobin 7.5 (L) 13.0 - 17.0 g/dL   HCT 23.1 (L) 39.0 - 52.0 %   MCV 79.9 78.0 - 100.0 fL   MCH 26.0 26.0 - 34.0 pg   MCHC 32.5 30.0 - 36.0 g/dL   RDW 17.8 (H) 11.5 - 15.5 %   Platelets 107 (L) 150 - 400 K/uL    Comment: CONSISTENT WITH PREVIOUS RESULT  Magnesium     Status: None   Collection Time: 04/01/15  1:45 PM  Result Value Ref Range   Magnesium 1.9 1.7 - 2.4 mg/dL  Creatinine, serum     Status: Abnormal   Collection Time: 04/01/15  1:45 PM  Result Value Ref Range   Creatinine, Ser 9.10 (H) 0.61 - 1.24 mg/dL   GFR calc non Af Amer 5 (L) >60 mL/min   GFR calc Af Amer 6 (L) >60 mL/min    Comment: (NOTE) The eGFR has been calculated using the CKD EPI equation. This calculation has not been validated in  all clinical  situations. eGFR's persistently <60 mL/min signify possible Chronic Kidney Disease.   I-STAT 3, arterial blood gas (G3+)     Status: Abnormal   Collection Time: 04/01/15  1:50 PM  Result Value Ref Range   pH, Arterial 7.338 (L) 7.350 - 7.450   pCO2 arterial 37.3 35.0 - 45.0 mmHg   pO2, Arterial 158.0 (H) 80.0 - 100.0 mmHg   Bicarbonate 20.2 20.0 - 24.0 mEq/L   TCO2 21 0 - 100 mmol/L   O2 Saturation 99.0 %   Acid-base deficit 5.0 (H) 0.0 - 2.0 mmol/L   Patient temperature 36.4 C    Sample type ARTERIAL   I-STAT, chem 8     Status: Abnormal   Collection Time: 04/01/15  1:54 PM  Result Value Ref Range   Sodium 135 135 - 145 mmol/L   Potassium 4.4 3.5 - 5.1 mmol/L   Chloride 103 101 - 111 mmol/L   BUN 32 (H) 6 - 20 mg/dL   Creatinine, Ser 9.00 (H) 0.61 - 1.24 mg/dL   Glucose, Bld 116 (H) 65 - 99 mg/dL   Calcium, Ion 1.04 (L) 1.13 - 1.30 mmol/L   TCO2 20 0 - 100 mmol/L   Hemoglobin 8.2 (L) 13.0 - 17.0 g/dL   HCT 24.0 (L) 39.0 - 52.0 %  Prepare RBC     Status: None   Collection Time: 04/01/15  2:19 PM  Result Value Ref Range   Order Confirmation      ORDER PROCESSED BY BLOOD BANK BLOOD ALREADY AVAILABLE    Dg Chest Port 1 View  04/01/2015   CLINICAL DATA:  CT 03/13/2015.  Chest x-ray 12/18/2014 .  EXAM: PORTABLE CHEST - 1 VIEW  COMPARISON:  None.  FINDINGS: Endotracheal tube, NG tube, Swan-Ganz catheter, mediastinal drainage catheters, bilateral chest tubes in stable position. Lungs are clear. Prior CABG. Cardiomegaly.  IMPRESSION: 1. Lines and tubes including bilateral chest tubes in good anatomic position. No pneumothorax. 2. Prior CABG. Cardiomegaly. Normal pulmonary vascularity. No focal infiltrate.   Electronically Signed   By: Marcello Moores  Register   On: 04/01/2015 08:46   Ct Angio Chest Aorta W/cm &/or Wo/cm  03/18/2015   CLINICAL DATA:  Severe back pain and chest pain. Cardiac catheterization earlier today. End-stage renal disease.  EXAM: CT ANGIOGRAPHY CHEST, ABDOMEN AND PELVIS   TECHNIQUE: Multidetector CT imaging through the chest, abdomen and pelvis was performed using the standard protocol during bolus administration of intravenous contrast. Multiplanar reconstructed images and MIPs were obtained and reviewed to evaluate the vascular anatomy.  CONTRAST:  163m OMNIPAQUE IOHEXOL 350 MG/ML SOLN  COMPARISON:  CT chest 12/04/2014.  CT abdomen and pelvis 07/20/2014.  FINDINGS: CTA CHEST FINDINGS  Unenhanced images of the chest demonstrate Coronary artery and mitral valve annulus calcifications. Calcification of the aorta. Right central venous catheter with tip in the SVC. No evidence of intramural thrombus.  Arterial phase contrast-enhanced images are obtained. There is a type A dissection of the aorta. Dissection begins at the aortic root, at the level of the aortic valve and extends throughout the ascending, transverse, and descending thoracic aorta. The true lumen is severely compressed in the ascending thoracic region with the false lumen being the dominant lumen throughout. Flow is demonstrated in the true and false lumen. Opacity of contrast throughout the aorta is decreased, possibly due to poor cardiac function or slow flow resulting from the dissection. The left common carotid artery arises from the innominate artery. Great vessel origins are patent. Great vessels  appear to arise from the true lumen.  Cardiac enlargement. No significant lymphadenopathy in the chest. Enlarged right thyroid gland. Esophagus is decompressed. Small bilateral pleural effusions with basilar atelectasis. Motion artifact limits evaluation of the lungs.  Review of the MIP images confirms the above findings.  CTA ABDOMEN AND PELVIS FINDINGS  Aortic dissection extends into the abdominal aorta and into the left iliac artery. The celiac axis, superior mesenteric artery, single right renal artery and duplicated left renal arteries, inferior mesenteric artery, and right iliac artery appear to arise from the true  lumen. Renal nephrograms are diminished bilaterally suggesting possible vascular compromise to the kidneys.  Surgical absence of the gallbladder. Small cyst in the liver. Pancreas, spleen, adrenal glands, inferior vena cava, and retroperitoneal lymph nodes are unremarkable. Small accessory spleen. Kidneys are diffusely atrophic without hydronephrosis. Multiple renal lesions likely representing cysts. Stomach, small bowel, and colon are not abnormally distended. No free air or free fluid in the abdomen.  Pelvis: Visualization of the low pelvis is limited due to streak artifact arising from bilateral hip arthroplasties. No free or loculated pelvic fluid collections are identified. No pelvic mass lesions. Degenerative changes throughout the thoracic and lumbar spine. No destructive bone lesions.  Review of the MIP images confirms the above findings.  IMPRESSION: Type A aortic dissection beginning at the level of the aortic valve and extending throughout the thoracic and abdominal aorta and into the left iliac artery. The true lumen is significantly compressed in the ascending region. Major vessels appear to arise from the true lumen. Delayed renal nephrograms are suggested.  These results were discussed at the view box prior to the time of interpretation on 03/12/2015 at 10:21 pm with Dr. Lauree Chandler , who verbally acknowledged these results.   Electronically Signed   By: Lucienne Capers M.D.   On: 03/22/2015 22:30   Ct Angio Abd/pel W/ And/or W/o  04/03/2015   CLINICAL DATA:  Severe back pain and chest pain. Cardiac catheterization earlier today. End-stage renal disease.  EXAM: CT ANGIOGRAPHY CHEST, ABDOMEN AND PELVIS  TECHNIQUE: Multidetector CT imaging through the chest, abdomen and pelvis was performed using the standard protocol during bolus administration of intravenous contrast. Multiplanar reconstructed images and MIPs were obtained and reviewed to evaluate the vascular anatomy.  CONTRAST:  195m  OMNIPAQUE IOHEXOL 350 MG/ML SOLN  COMPARISON:  CT chest 12/04/2014.  CT abdomen and pelvis 07/20/2014.  FINDINGS: CTA CHEST FINDINGS  Unenhanced images of the chest demonstrate Coronary artery and mitral valve annulus calcifications. Calcification of the aorta. Right central venous catheter with tip in the SVC. No evidence of intramural thrombus.  Arterial phase contrast-enhanced images are obtained. There is a type A dissection of the aorta. Dissection begins at the aortic root, at the level of the aortic valve and extends throughout the ascending, transverse, and descending thoracic aorta. The true lumen is severely compressed in the ascending thoracic region with the false lumen being the dominant lumen throughout. Flow is demonstrated in the true and false lumen. Opacity of contrast throughout the aorta is decreased, possibly due to poor cardiac function or slow flow resulting from the dissection. The left common carotid artery arises from the innominate artery. Great vessel origins are patent. Great vessels appear to arise from the true lumen.  Cardiac enlargement. No significant lymphadenopathy in the chest. Enlarged right thyroid gland. Esophagus is decompressed. Small bilateral pleural effusions with basilar atelectasis. Motion artifact limits evaluation of the lungs.  Review of the MIP images confirms the  above findings.  CTA ABDOMEN AND PELVIS FINDINGS  Aortic dissection extends into the abdominal aorta and into the left iliac artery. The celiac axis, superior mesenteric artery, single right renal artery and duplicated left renal arteries, inferior mesenteric artery, and right iliac artery appear to arise from the true lumen. Renal nephrograms are diminished bilaterally suggesting possible vascular compromise to the kidneys.  Surgical absence of the gallbladder. Small cyst in the liver. Pancreas, spleen, adrenal glands, inferior vena cava, and retroperitoneal lymph nodes are unremarkable. Small accessory  spleen. Kidneys are diffusely atrophic without hydronephrosis. Multiple renal lesions likely representing cysts. Stomach, small bowel, and colon are not abnormally distended. No free air or free fluid in the abdomen.  Pelvis: Visualization of the low pelvis is limited due to streak artifact arising from bilateral hip arthroplasties. No free or loculated pelvic fluid collections are identified. No pelvic mass lesions. Degenerative changes throughout the thoracic and lumbar spine. No destructive bone lesions.  Review of the MIP images confirms the above findings.  IMPRESSION: Type A aortic dissection beginning at the level of the aortic valve and extending throughout the thoracic and abdominal aorta and into the left iliac artery. The true lumen is significantly compressed in the ascending region. Major vessels appear to arise from the true lumen. Delayed renal nephrograms are suggested.  These results were discussed at the view box prior to the time of interpretation on 03/17/2015 at 10:21 pm with Dr. Lauree Chandler , who verbally acknowledged these results.   Electronically Signed   By: Lucienne Capers M.D.   On: 03/18/2015 22:30    ROS: unable to obtain as pt is intubated   Blood pressure 101/71, pulse 80, temperature 97.5 F (36.4 C), temperature source Oral, resp. rate 0, height 6' (1.829 m), weight 85.2 kg (187 lb 13.3 oz), SpO2 100 %. General appearance: sedated, intubated Resp: diminished breath sounds bilaterally Cardio: regular rate and rhythm, S1, S2 normal, no murmur, click, rub or gallop GI: soft, non-tender; bowel sounds normal; no masses,  no organomegaly Extremities: extremities normal, atraumatic, no cyanosis or edema PC in right chest  Assessment/Plan: 64 year old BM with malignant HTN and ESRD now presenting with a type A aortic dissection s/p repair 1 Type A aortic dissection s/p repair- now on IV antihypertensives in ICU 2 ESRD: normally TTS at [facility] via PC- no plans for  anything today.  Will reevaluate in AM for stability to get HD 3 Hypertension: malignant and poorly controlled.  Now on IV antihypertensives with good results, will eventually wean to PO regimen that he will hopefully keep up with  4. Anemia of ESRD: was on mircera and iron as OP- recently dosed- hgb dropping with surgery getting transfused 5. Metabolic Bone Disease: once taking POs will resume appropriate meds    Addelyn Alleman A 04/01/2015, 2:37 PM

## 2015-04-01 NOTE — Progress Notes (Signed)
Utilization review completed.  

## 2015-04-01 NOTE — Anesthesia Postprocedure Evaluation (Signed)
  Anesthesia Post-op Note  Patient: Matthew Kane  Procedure(s) Performed: Procedure(s): REPAIR OF ASCENDING AORTIC DISSECTION, HYPOTHERMIC CIRCULATORY ARREST, RIGHT AXILLARY CANNULATION (N/A)  Patient Location: ICU  Anesthesia Type:General  Level of Consciousness: sedated and unresponsive  Airway and Oxygen Therapy: Patient remains intubated and on ventilator  Post-op Pain: none  Post-op Assessment: Post-op Vital signs reviewed, Patient's Cardiovascular Status Stable and Respiratory Function Stable  Post-op Vital Signs: Reviewed  Filed Vitals:   04/01/15 0830  BP:   Pulse: 80  Temp: 34.1 C  Resp: 18    Complications: No apparent anesthesia complications

## 2015-04-01 NOTE — Progress Notes (Signed)
Patient ID: Matthew Kane, male   DOB: May 06, 1951, 65 y.o.   MRN: [id number] EVENING ROUNDS NOTE :     [address]       [place] [address]             [phone]                 1 Day Post-Op Procedure(s) (LRB): REPAIR OF ASCENDING AORTIC DISSECTION, HYPOTHERMIC CIRCULATORY ARREST, RIGHT AXILLARY CANNULATION (N/A)  Total Length of Stay:  LOS: 1 day  BP 110/81 mmHg  Pulse 79  Temp(Src) 98.4 F (36.9 C) (Oral)  Resp 17  Ht 6' (1.829 m)  Wt 213 lb 3 oz (96.7 kg)  BMI 28.91 kg/m2  SpO2 100%  .Intake/Output      08/24 0701 - 08/25 0700 08/25 0701 - 08/26 0700   I.V. (mL/kg) 747.1 (8.8) 935.7 (9.7)   Blood 1010 1775   NG/GT  60   IV Piggyback 255 500   Total Intake(mL/kg) 2012.1 (23.6) 3270.7 (33.8)   Urine (mL/kg/hr)  1350 (1.2)   Blood  900 (0.8)   Chest Tube  720 (0.6)   Total Output   2970   Net +2012.1 +300.7          . sodium chloride 20 mL/hr at 04/01/15 1100  . [START ON 04/02/2015] sodium chloride    . sodium chloride 20 mL/hr at 04/01/15 0955  . dexmedetomidine 0.7 mcg/kg/hr (04/01/15 1608)  . lactated ringers    . lactated ringers    . nitroGLYCERIN 50 mcg/min (04/01/15 1829)  . phenylephrine (NEO-SYNEPHRINE) Adult infusion Stopped (04/01/15 0700)     Lab Results  Component Value Date   WBC 6.7 04/01/2015   HGB 8.2* 04/01/2015   HCT 24.0* 04/01/2015   PLT 107* 04/01/2015   GLUCOSE 116* 04/01/2015   CHOL 111 01/12/2012   TRIG 61 01/12/2012   HDL 46 01/12/2012   LDLCALC 53 01/12/2012   ALT 11 12/05/2014   AST 10 12/05/2014   NA 135 04/01/2015   K 4.4 04/01/2015   CL 103 04/01/2015   CREATININE 9.00* 04/01/2015   BUN 32* 04/01/2015   CO2 26 04/01/2015   TSH 0.311* 12/04/2014   PSA 1.72 07/22/2008   INR 1.52* 04/01/2015   Sedated on vent, 820 from chest tubes since surgery 12 hours prob dialysis in am  Grace Isaac MD  Beeper [phone] Office [phone] 04/01/2015 6:51 PM

## 2015-04-01 NOTE — Brief Op Note (Signed)
03/25/2015 - 04/01/2015  5:53 AM  PATIENT:  Matthew Kane  64 y.o. male  PRE-OPERATIVE DIAGNOSIS:  Type A aortic dissection  POST-OPERATIVE DIAGNOSIS:  Type A aortic dissection  PROCEDURE:   REPAIR OF ASCENDING AORTIC DISSECTION (30 mm Hemashield Platinum graft)  Hypothermic circulatory arrest  Right axillary cannulation   SURGEON:  Gaye Pollack, MD  ASSISTANT: Suzzanne Cloud, PA-C  ANESTHESIA:   general  PATIENT CONDITION:  ICU - intubated and hemodynamically stable.  PRE-OPERATIVE WEIGHT: 85 kg

## 2015-04-01 NOTE — Op Note (Signed)
CARDIOVASCULAR SURGERY OPERATIVE NOTE  04/01/2015  Surgeon:  Gaye Pollack, MD  First Assistant: Suzzanne Cloud,  PA-C   Preoperative Diagnosis:  Acute type A aortic dissection   Postoperative Diagnosis:  Same   Procedure:  1. Emergency Median Sternotomy 2.   Right axillary artery cannulation 3.   Extracorporeal circulation 4.   Replacement of ascending aorta and proximal aortic arch (Hemi-arch) using a 30 mm Hemashield supra-coronary tube graft under deep hypothermic circulatory arrest.  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 ECG showed ST elevation in the precordium and code STEMI called. He had ongoing CP on arrival to [facility] and cath showed no significant coronary disease. 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 The major vessels appear to come from the true lumen. The ascending aorta at the level of the right PA is 4.6 cm and the distal ascending aorta is 4.7 cm. There is slight tapering just beyond the left subclavian and then the proximal descending aorta is enlarged.   He has an acute type A aortic dissection with mild aneurysmal enlargement of the ascending, arch and descending aorta. He has no significant coronary disease and normal LV function. I think the best option is to proceed with emergent repair. I discussed the operative procedure with the patient and family including alternatives, benefits and risks; including but not limited to bleeding, blood transfusion, infection, stroke, myocardial infarction, heart block requiring a permanent pacemaker, organ  dysfunction, and death. Matthew Kane understands and agrees to proceed.   Preparation:  The patient was seen in the preoperative holding area and the correct patient, correct operation were confirmed with the patient after reviewing the medical record and catheterization. The consent was signed by me. Preoperative antibiotics were given. A pulmonary arterial line and right brachial arterial line were placed by the anesthesia team. The patient was taken back to the operating room and positioned supine on the operating room table. After being placed under general endotracheal anesthesia by the anesthesia team a foley catheter was placed. The neck, chest, abdomen, and both legs were prepped with betadine soap and solution and draped in the usual sterile manner. A surgical time-out was taken and the correct patient and operative procedure were confirmed with the nursing and anesthesia staff.  TEE:  Performed by Dr. Lorrene Reid  This showed a fairly normal aortic valve with trivial AI. There was no MR. There was normal LV function.   Cardiopulmonary Bypass:  A median sternotomy was performed. The pericardium was opened in the midline. Right ventricular function appeared normal. The ascending aorta was enlarged and diffusely ecchymotic. There was a serosanguinous pericardial effusion.     Right axillary artery exposure and cannulation:  A transverse incision was made below the right clavicle. The pectoralis major muscle was split along its fibers and the pectoralis minor muscle was retracted laterally. The brachial plexus was identified and gently retracted laterally to expose the axillary artery. The artery was controlled proximally and distally with vessel loops. The patient was fully heparinized and ACT maintained greater than 400. The axillary artery was clamped proximally  and distally with peripheral Debakey clamps. It was opened longitudinally. There was no sign of dissection here. An 8 mm  Hemashield dacron graft was anastomosed in an end to side manner using continuous 5-0 prolene suture. CoSeal was applied for hemostasis and the clamp removed. The graft was connected to the arterial end of the bypass circuit. Venous cannulation was performed via the right atrial appendage using a two-staged venous cannula. An antegrade cardioplegia/vent cannula was inserted into the mid-ascending aorta. Aortic occlusion was performed with a single cross-clamp.  A temperature probe was inserted into the interventricular septum and an insulating pad was placed in the pericardium. CO2 was insufflated into the pericardium throughout the case to minimize intracardiac air.   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 Systemic cooling was begun with a goal temperature of 18 degrees centigrade by rectal temperature probe. A retrograde cardioplegia cannula was placed through the right atrium into the coronary sinus without difficulty.  After 30 minutes of cooling the target temperature of 18 degrees centigrade was reached. BIS was zero. The patient was given 20 mg of Amidate and 500 mg of Solumedrol. The head was packed in ice. The bed was placed in steep trendelenburg. Circulatory arrest was begun and the blood volume emptied into the venous reservoir. The Innominate artery was clamped. Continuous antegrade cerebral perfusion was begun through the axillary artery cannula.  Cold blood retrograde cardioplegia was given and myocardial temperature dropped to 10 degrees centigrade. Additional doses were given at approximately 20 minute intervals throughout the period of circulatory arrest and cross-clamping. Complete diastolic arrest was maintained.  The aorta was transected just proximal to the innominate artery beveling the resection out along the undersurface of the aortic  arch (Hemiarch replacement). The aortic diameter was measured at 34 mm here. A 30 x 10 mm Hemasheild Platinum vascular graft was prepared.  It was anastomosed to the aortic arch in an end to end manner using 3-0 prolene continuous suture with a felt strip internal and external to reinforce the anastomisis and approximate the dissected aortic wall. A light coating of CoSeal was applied to seal needle holes. The arterial end of the bypass circuit was then connected to the 10mm side arm graft and circulation was slowly resumed. The aortic graft was cross-clamped proximal to the side arm graft and full CPB support was resumed. Circulatory arrest time was 29 minutes with antegrade cerebral perfusion during that period. The patient was rewarmed.   The remainder of the ascending aorta was resected down to the supra-coronary level. The dissection tear was in the proximal ascending aorta. The dissection continued down into the aortic root but the root appeared to be of normal size and the sinuses were mostly intact. The valve had mild thickening but the three leaflets moved normally and there was trivial AI on echo. I felt that the valve and root could be spared. The proximal aorta was transected about 1 cm distal to the coronary arteries. The graft was cut to the appropriate length and anastomosed to the proximal aorta in an end to end manner using continuous 3-0 prolene suture and felt strips internally and externally to reinforce the anastomosis and approximate the dissected layers of the wall. A vent catheter was then placed into the graft through a purse string suture.     Completion:  The patient was rewarmed to 37 degrees  Centigrade. The crossclamp was removed. There was spontaneous return of ventricular tachycardia and the patient was defibrillated to sinus rhythm.  The distal and proximal anastomoses were checked for hemostasis. The position of the graft was satisfactory.  Two temporary epicardial pacing  wires were placed on the right atrium and two on the right ventricle. The patient was weaned from CPB without difficulty on no inotropes.  Cardiac output was 5 LPM. TEE showed normal LV function with trivial AI unchanged from preop.  Heparin was fully reversed with protamine and the aortic and venous cannulas removed. Hemostasis was achieved. He was given platelets, FFP and cryoprecipitate due to coagulopathy.  Mediastinal and bilateral pleural drainage tubes were placed. The 10 mm side arm graft was ligated with a #1 silk suture and suture ligated with 3-0 prolene suture. The axillary artery graft was ligated in the same manner. The sternum was closed with double #6 stainless steel wires. The fascia was closed with continuous # 1 vicryl suture. The subcutaneous tissue was closed with 2-0 vicryl continuous suture. The skin was closed with 3-0 vicryl subcuticular suture. All sponge, needle, and instrument counts were reported correct at the end of the case. Dry sterile dressings were placed over the incisions and around the chest tubes which were connected to pleurevac suction. The patient was then transported to the surgical intensive care unit in critical but stable condition.

## 2015-04-01 NOTE — OR Nursing (Signed)
Forty minute call to SICU charge nurse at [place].

## 2015-04-01 NOTE — Progress Notes (Signed)
Initial Nutrition Assessment  DOCUMENTATION CODES:   Not applicable  INTERVENTION:   If TF started recommend:   Initiate Vital AF 1.2 formula at 25 ml/hr and increase by 10 ml every 4 hours to goal rate of 55 ml/hr   Prostat liquid protein 30 ml TID  Total TF regimen to provide 1884 kcals, 144 gm protein, 1071 ml of free water  NUTRITION DIAGNOSIS:   Inadequate oral intake related to inability to eat as evidenced by NPO status   GOAL:   Patient will meet greater than or equal to 90% of their needs  MONITOR:   Vent status, Labs, Weight trends, I & O's  REASON FOR ASSESSMENT:   Ventilator  ASSESSMENT:   64 yo Male with PMH of ESRD on HD, HTN, GERD, BPH, OSA who is transferred to [facility] tonight from [facility] ED for emergent cardiac cath. He presented to the ED at [facility] today with c/o chest pain and nausea that started around 5pm. EKG with ST elevation in the precordial leads. Code STEMI called. At time of arrival to [facility], pt c/o ongoing chest pain. Mild SOB. He has been on ESRD since 2012. Cardiac cath in 2006 with minimal CAD. No cardiac issues since then. He has seen Dr. Lattie Haw in our [place] office in 2013 for pre-operative evaluation.  Patient s/p procedures 8/25: REPAIR OF ASCENDING AORTIC DISSECTION  HYPOTHERMIC CIRCULATORY ARREST RIGHT AXILLARY CANNULATION  Patient is currently intubated on ventilator support -- OGT in place MV: 8.7 L/min Temp (24hrs), Avg:96.2 F (35.7 C), Min:93.4 F (34.1 C), Max:98.4 F (36.9 C)   RD unable to complete Nutrition Focused Physical Exam at this time.  Diet Order:  NPO  Skin:  Reviewed, no issues  Last BM:  N/A  Height:   Ht Readings from Last 1 Encounters:  03/20/2015 6' (1.829 m)    Weight:   Wt Readings from Last 1 Encounters:  03/28/2015 187 lb 13.3 oz (85.2 kg)    Ideal Body Weight:  81 kg  BMI:  Body mass index is 25.47 kg/(m^2).  Estimated Nutritional Needs:   Kcal:  1840  Protein:   130-140 gm  Fluid:  per MD  EDUCATION NEEDS:   No education needs identified at this time  Arthur Holms, RD, LDN Pager #: [phone] After-Hours Pager #: [phone]

## 2015-04-01 NOTE — Progress Notes (Signed)
Echocardiogram Echocardiogram Transesophageal has been performed.  Matthew Kane 04/01/2015, 1:52 AM

## 2015-04-01 NOTE — Progress Notes (Signed)
Patient woke up bucking vent, would not follow commands, BP spike to 180s/100s (100s), precedex restarted at 0.7 mcg, 4 mg of morphine given, nipride at 0.3 nitro at 50 mcg; will continue to monitor and wean as able.  Rowe Pavy, RN

## 2015-04-02 ENCOUNTER — Inpatient Hospital Stay (HOSPITAL_COMMUNITY): Payer: Medicare HMO

## 2015-04-02 LAB — CBC
HCT: 24.9 % — ABNORMAL LOW (ref 39.0–52.0)
HEMATOCRIT: 24.7 % — AB (ref 39.0–52.0)
HEMATOCRIT: 25.3 % — AB (ref 39.0–52.0)
HEMOGLOBIN: 8.3 g/dL — AB (ref 13.0–17.0)
Hemoglobin: 8.2 g/dL — ABNORMAL LOW (ref 13.0–17.0)
Hemoglobin: 7.9 g/dL — ABNORMAL LOW (ref 13.0–17.0)
MCH: 26.8 pg (ref 26.0–34.0)
MCH: 26.2 pg (ref 26.0–34.0)
MCH: 26.8 pg (ref 26.0–34.0)
MCHC: 33.2 g/dL (ref 30.0–36.0)
MCHC: 31.7 g/dL (ref 30.0–36.0)
MCHC: 32.8 g/dL (ref 30.0–36.0)
MCV: 80.7 fL (ref 78.0–100.0)
MCV: 82.7 fL (ref 78.0–100.0)
MCV: 81.6 fL (ref 78.0–100.0)
PLATELETS: 102 10*3/uL — AB (ref 150–400)
Platelets: 125 10*3/uL — ABNORMAL LOW (ref 150–400)
Platelets: 86 10*3/uL — ABNORMAL LOW (ref 150–400)
RBC: 3.06 MIL/uL — ABNORMAL LOW (ref 4.22–5.81)
RBC: 3.01 MIL/uL — AB (ref 4.22–5.81)
RBC: 3.1 MIL/uL — ABNORMAL LOW (ref 4.22–5.81)
RDW: 17.9 % — AB (ref 11.5–15.5)
RDW: 18.1 % — ABNORMAL HIGH (ref 11.5–15.5)
RDW: 18.1 % — AB (ref 11.5–15.5)
WBC: 10.2 10*3/uL (ref 4.0–10.5)
WBC: 8.8 10*3/uL (ref 4.0–10.5)
WBC: 8.1 10*3/uL (ref 4.0–10.5)

## 2015-04-02 LAB — GLUCOSE, CAPILLARY
GLUCOSE-CAPILLARY: 92 mg/dL (ref 65–99)
GLUCOSE-CAPILLARY: 98 mg/dL (ref 65–99)
GLUCOSE-CAPILLARY: 88 mg/dL (ref 65–99)
Glucose-Capillary: 130 mg/dL — ABNORMAL HIGH (ref 65–99)

## 2015-04-02 LAB — RENAL FUNCTION PANEL
Albumin: 2.5 g/dL — ABNORMAL LOW (ref 3.5–5.0)
Anion gap: 15 (ref 5–15)
BUN: 48 mg/dL — AB (ref 6–20)
CALCIUM: 7.9 mg/dL — AB (ref 8.9–10.3)
CHLORIDE: 104 mmol/L (ref 101–111)
CO2: 15 mmol/L — AB (ref 22–32)
CREATININE: 10.67 mg/dL — AB (ref 0.61–1.24)
GFR calc Af Amer: 5 mL/min — ABNORMAL LOW (ref >60)
GFR calc non Af Amer: 4 mL/min — ABNORMAL LOW (ref >60)
GLUCOSE: 105 mg/dL — AB (ref 65–99)
Phosphorus: 8.3 mg/dL — ABNORMAL HIGH (ref 2.5–4.6)
Potassium: 5.5 mmol/L — ABNORMAL HIGH (ref 3.5–5.1)
SODIUM: 134 mmol/L — AB (ref 135–145)

## 2015-04-02 LAB — CREATININE, SERUM
Creatinine, Ser: 6.72 mg/dL — ABNORMAL HIGH (ref 0.61–1.24)
GFR calc Af Amer: 9 mL/min — ABNORMAL LOW (ref >60)
GFR, EST NON AFRICAN AMERICAN: 8 mL/min — AB (ref >60)

## 2015-04-02 LAB — BASIC METABOLIC PANEL
Anion gap: 13 (ref 5–15)
BUN: 43 mg/dL — AB (ref 6–20)
CALCIUM: 7.7 mg/dL — AB (ref 8.9–10.3)
CO2: 19 mmol/L — AB (ref 22–32)
CREATININE: 10.38 mg/dL — AB (ref 0.61–1.24)
Chloride: 101 mmol/L (ref 101–111)
GFR calc non Af Amer: 5 mL/min — ABNORMAL LOW (ref >60)
GFR, EST AFRICAN AMERICAN: 5 mL/min — AB (ref >60)
Glucose, Bld: 116 mg/dL — ABNORMAL HIGH (ref 65–99)
Potassium: 5.3 mmol/L — ABNORMAL HIGH (ref 3.5–5.1)
Sodium: 133 mmol/L — ABNORMAL LOW (ref 135–145)

## 2015-04-02 LAB — PREPARE PLATELET PHERESIS
UNIT DIVISION: 0
UNIT DIVISION: 0

## 2015-04-02 LAB — POCT I-STAT, CHEM 8
BUN: 25 mg/dL — ABNORMAL HIGH (ref 6–20)
CALCIUM ION: 1.17 mmol/L (ref 1.13–1.30)
CHLORIDE: 102 mmol/L (ref 101–111)
Creatinine, Ser: 6.7 mg/dL — ABNORMAL HIGH (ref 0.61–1.24)
Glucose, Bld: 93 mg/dL (ref 65–99)
HCT: 27 % — ABNORMAL LOW (ref 39.0–52.0)
HEMOGLOBIN: 9.2 g/dL — AB (ref 13.0–17.0)
POTASSIUM: 3.6 mmol/L (ref 3.5–5.1)
SODIUM: 135 mmol/L (ref 135–145)
TCO2: 24 mmol/L (ref 0–100)

## 2015-04-02 LAB — PREPARE FRESH FROZEN PLASMA
UNIT DIVISION: 0
Unit division: 0
Unit division: 0

## 2015-04-02 LAB — HEMOGLOBIN A1C
Hgb A1c MFr Bld: 5 % (ref 4.8–5.6)
Mean Plasma Glucose: 97 mg/dL

## 2015-04-02 LAB — MAGNESIUM
MAGNESIUM: 1.7 mg/dL (ref 1.7–2.4)
Magnesium: 1.8 mg/dL (ref 1.7–2.4)

## 2015-04-02 LAB — PREPARE CRYOPRECIPITATE: UNIT DIVISION: 0

## 2015-04-02 MED ORDER — HEPARIN SODIUM (PORCINE) 1000 UNIT/ML DIALYSIS: 1000.0000 [IU] | INTRAMUSCULAR | Status: DC | PRN

## 2015-04-02 MED ORDER — LIDOCAINE HCL (PF) 1 % IJ SOLN: 5.0000 mL | INTRAMUSCULAR | Status: DC | PRN

## 2015-04-02 MED ORDER — ALTEPLASE 2 MG IJ SOLR: 2.0000 mg | Freq: Once | INTRAMUSCULAR | Status: DC | PRN

## 2015-04-02 MED ORDER — SODIUM CHLORIDE 0.9 % IV SOLN: 100.0000 mL | INTRAVENOUS | Status: DC | PRN

## 2015-04-02 MED ORDER — DEXMEDETOMIDINE HCL IN NACL 400 MCG/100ML IV SOLN
0.4000 ug/kg/h | INTRAVENOUS | Status: DC
  Administered 2015-04-02: 0.9 ug/kg/h via INTRAVENOUS
  Administered 2015-04-03: 1 ug/kg/h via INTRAVENOUS
  Administered 2015-04-03 – 2015-04-04 (×8): 1.2 ug/kg/h via INTRAVENOUS
  Filled 2015-04-02 (×11): qty 100

## 2015-04-02 MED ORDER — NEPRO/CARBSTEADY PO LIQD: 237.0000 mL | ORAL | Status: DC | PRN

## 2015-04-02 MED ORDER — PENTAFLUOROPROP-TETRAFLUOROETH EX AERO: 1.0000 "application " | INHALATION_SPRAY | CUTANEOUS | Status: DC | PRN

## 2015-04-02 MED ORDER — LIDOCAINE-PRILOCAINE 2.5-2.5 % EX CREA: 1.0000 "application " | TOPICAL_CREAM | CUTANEOUS | Status: DC | PRN

## 2015-04-02 NOTE — Progress Notes (Signed)
Pt became very agitated and combative- kicking, trying to get out of bed.  RN sedated pt, no vent weaning presently d/t pt sedated and plans for dialysis today. RN aware.

## 2015-04-02 NOTE — Progress Notes (Signed)
2 Days Post-Op Procedure(s) (LRB): REPAIR OF ASCENDING AORTIC DISSECTION, HYPOTHERMIC CIRCULATORY ARREST, RIGHT AXILLARY CANNULATION (N/A) Subjective:  Sedated on vent. Reportedly wild when he wakes up, not following commands but moving all extremities strongly.  Objective: Vital signs in last 24 hours: Temp:  [93.4 F (34.1 C)-98.6 F (37 C)] 98.6 F (37 C) (08/26 0700) Pulse Rate:  [79-98] 79 (08/26 0700) Cardiac Rhythm:  [-] Atrial paced (08/26 0400) Resp:  [0-26] 14 (08/26 0700) BP: (83-186)/(59-121) 112/73 mmHg (08/26 0700) SpO2:  [99 %-100 %] 100 % (08/26 0700) Arterial Line BP: (83-206)/(52-115) 117/65 mmHg (08/26 0700) FiO2 (%):  [40 %-50 %] 40 % (08/26 0645) Weight:  [93.9 kg (207 lb 0.2 oz)-96.7 kg (213 lb 3 oz)] 93.9 kg (207 lb 0.2 oz) (08/26 0500)  Hemodynamic parameters for last 24 hours: PAP: (16-37)/(11-26) 21/11 mmHg CO:  [2.9 L/min-7.2 L/min] 4 L/min CI:  [1.4 L/min/m2-3.4 L/min/m2] 1.9 L/min/m2  Intake/Output from previous day: 08/25 0701 - 08/26 0700 In: 4098.3 [I.V.:1683.3; Blood:1775; NG/GT:90; IV Piggyback:550] Out: 4008 [Urine:1350; Blood:900; Chest Tube:940] Intake/Output this shift:    Heart: regular rate and rhythm, S1, S2 normal, no murmur, click, rub or gallop Lungs: clear to auscultation bilaterally Abdomen: soft, non-distended Extremities: edema mild Wound: dressings dry  Lab Results:  Recent Labs  04/01/15 1345 04/01/15 1354 04/02/15 0440  WBC 6.7  --  10.2  HGB 7.5* 8.2* 8.2*  HCT 23.1* 24.0* 24.7*  PLT 107*  --  125*   BMET:  Recent Labs  04/01/15 0005  04/01/15 1354 04/02/15 0440  NA 136  < > 135 133*  K 3.5  < > 4.4 5.3*  CL 99*  < > 103 101  CO2 26  --   --  19*  GLUCOSE 120*  < > 116* 116*  BUN 31*  < > 32* 43*  CREATININE 9.90*  < > 9.00* 10.38*  CALCIUM 8.7*  --   --  7.7*  < > = values in this interval not displayed.  PT/INR:  Recent Labs  04/01/15 0826  LABPROT 18.3*  INR 1.52*   ABG    Component Value  Date/Time   PHART 7.338* 04/01/2015 1350   HCO3 20.2 04/01/2015 1350   TCO2 20 04/01/2015 1354   ACIDBASEDEF 5.0* 04/01/2015 1350   O2SAT 99.0 04/01/2015 1350   CBG (last 3)   Recent Labs  04/01/15 2010 04/01/15 2348 04/02/15 0329  GLUCAP 108* 110* 130*   CXR: ok  ECG: sinus 61 with 1st degree AV block, nonspecific ST and T wave changes.  Assessment/Plan: S/P Procedure(s) (LRB): REPAIR OF ASCENDING AORTIC DISSECTION, HYPOTHERMIC CIRCULATORY ARREST, RIGHT AXILLARY CANNULATION (N/A) He is hemodynamically stable but gets very hypertensive when he wakes up and is agitated. He had uncontrolled HTN preop. Will use prn hydralazine and low dose beta blocker. No nipride with his renal failure.  ESRD on HD: nephrology following. Anticipate HD today with K+ and creat rising. Volume status looks ok and is about at preop wt.  Remains on vent because he has been agitated and not following commands when he wakes up. Will try again today. Restrain as needed for safety.  Neuro status unclear. He is moving everything strongly but agitated and not following commands. This may be some encephalopathy due to circulatory arrest, persistent drugs in his system from renal failure. Will follow for now since he sounds nonfocal according to nurses.  Keep chest tubes in today.   LOS: 2 days    Fernande Boyden  Astha Probasco 04/02/2015

## 2015-04-02 NOTE — Plan of Care (Signed)
Problem: Problem: Respiratory Progression Goal: ABLE TO EXTUBATE Outcome: Not Progressing Requiring ventilator at this point, will be dialyzed later today, awakens agitated, will not follow commands. Will continue to assess.

## 2015-04-02 NOTE — Progress Notes (Signed)
Precedex was starting to be titrated down, at 0.5 mcg patient woke up and was fighting the vent, combative and agitated, it took 4 people to hold patient down, precedex resumed at 0.7 mcg, 4 morphine given, 2mg  of versed given.  Rowe Pavy, RN

## 2015-04-02 NOTE — Progress Notes (Signed)
TCTS BRIEF SICU PROGRESS NOTE  2 Days Post-Op  S/P Procedure(s) (LRB): REPAIR OF ASCENDING AORTIC DISSECTION, HYPOTHERMIC CIRCULATORY ARREST, RIGHT AXILLARY CANNULATION (N/A)   Reportedly agitated previously, now sedated on vent AAI paced w/ stable BP Tolerated HD w/ 500 mL volume off  Plan: Continue current plan.  Continue Precedex as needed for sedation and keep on vent  Rexene Alberts 04/02/2015 8:14 PM

## 2015-04-02 NOTE — Care Management Important Message (Signed)
Important Message  Patient Details  Name: Matthew Kane MRN: [id number] Date of Birth: 07-07-1951   Medicare Important Message Given:  Yes-second notification given    Sharin Mons, RN 04/02/2015, 1:49 PM

## 2015-04-02 NOTE — Progress Notes (Signed)
Matthew Kane Progress Note   Subjective: on vent, sedat ed now  Filed Vitals:   04/02/15 0700 04/02/15 0800 04/02/15 0840 04/02/15 0845  BP: 112/73 109/74  109/74  Pulse: 79 80 103 80  Temp: 98.6 F (37 C) 98.4 F (36.9 C) 98.8 F (37.1 C)   TempSrc:      Resp: 14 15 18 18   Height:      Weight:      SpO2: 100% 100% 100% 100%   Dialyzes at [facility]- TTS EDW 88. HD Bath 2 K/2 ca, Dialyzer 180, Heparin yes. Access PC. Calcitriol 1 mcg per tx, heparin 10,000 per tx, mircera 75- given 8/23 also in the midst of venofer 100 per tx for 5  Exam: General appearance: sedated, intubated Resp: diminished breath sounds bilaterally Cardio: regular rate and rhythm, S1, S2 normal, no murmur, click, rub or gallop GI: soft, non-tender; bowel sounds normal; no masses, no organomegaly Extremities: extremities normal, atraumatic, no cyanosis or edema PC in right chest  Assessment/Plan: 64 year old BM with malignant HTN and ESRD now presenting with a type A aortic dissection s/p repair 1 Type A aortic dissection s/p repair- on vent , ICU, required Nipride but is off now. Got MTP per tube 2 ESRD: HD today, gentle Rx 3 Hypertension: malignant and poorly controlled. Now on IV antihypertensives with good results, will eventually wean to PO regimen that he will hopefully keep up with  4. Anemia of ESRD: was on mircera and iron as OP- recently dosed- hgb dropping with surgery getting transfused 5. Metabolic Bone Disease: once taking POs will resume appropriate meds  6. Volume - up 5-6kg by weights, PAD is 15 which is good   Plan - HD today for solute/ Matthew Hose MD  pager [phone]    cell [phone]  04/02/2015, 9:40 AM     Recent Labs Lab 03/25/2015 1715 04/01/15 0005  04/01/15 0635  04/01/15 0824 04/01/15 1345 04/01/15 1354 04/02/15 0440  NA 140 136  < > 133*  < > 135  --  135 133*  K 3.7 3.5  < > 3.9  < > 4.0  --  4.4 5.3*  CL 101 99*  < > 100*  --   --   --  103  101  CO2 26 26  --   --   --   --   --   --  19*  GLUCOSE 105* 120*  < > 115*  --  113*  --  116* 116*  BUN 29* 31*  < > 31*  --   --   --  32* 43*  CREATININE 9.83* 9.90*  < > 8.80*  --   --  9.10* 9.00* 10.38*  CALCIUM 9.1 8.7*  --   --   --   --   --   --  7.7*  < > = values in this interval not displayed. No results for input(s): AST, ALT, ALKPHOS, BILITOT, PROT, ALBUMIN in the last 168 hours.  Recent Labs Lab 04/01/15 0826 04/01/15 1345 04/01/15 1354 04/02/15 0440  WBC 6.9 6.7  --  10.2  HGB 8.3* 7.5* 8.2* 8.2*  HCT 25.0* 23.1* 24.0* 24.7*  MCV 79.9 79.9  --  80.7  PLT 97* 107*  --  125*   . acetaminophen  1,000 mg Oral 4 times per day   Or  . acetaminophen (TYLENOL) oral liquid 160 mg/5 mL  1,000 mg Per Tube 4 times per  day  . allopurinol  100 mg Oral QPM  . antiseptic oral rinse  7 mL Mouth Rinse QID  . aspirin EC  325 mg Oral Daily   Or  . aspirin  324 mg Per Tube Daily  . bisacodyl  10 mg Oral Daily   Or  . bisacodyl  10 mg Rectal Daily  . calcitRIOL  0.5 mcg Oral Daily  . calcium carbonate  800 mg of elemental calcium Oral BID WC  . chlorhexidine  15 mL Mouth/Throat BID  . docusate sodium  200 mg Oral Daily  . insulin aspart  0-24 Units Subcutaneous 6 times per day  . levofloxacin (LEVAQUIN) IV  750 mg Intravenous Q24H  . metoprolol tartrate  12.5 mg Oral BID   Or  . metoprolol tartrate  12.5 mg Per Tube BID  . multivitamin  1 tablet Oral QHS  . [START ON 04/03/2015] pantoprazole  40 mg Oral Daily  . sodium chloride  3 mL Intravenous Q12H   . sodium chloride 20 mL/hr at 04/01/15 1100  . sodium chloride    . sodium chloride 20 mL/hr at 04/01/15 0955  . dexmedetomidine 0.7 mcg/kg/hr (04/02/15 0847)  . lactated ringers    . lactated ringers    . nitroGLYCERIN Stopped (04/02/15 0115)  . phenylephrine (NEO-SYNEPHRINE) Adult infusion Stopped (04/01/15 0700)   sodium chloride, albumin human, calcium carbonate, hydrALAZINE, lactated ringers, metoprolol,  midazolam, morphine injection, ondansetron (ZOFRAN) IV, oxyCODONE, sodium chloride, traMADol

## 2015-04-03 ENCOUNTER — Inpatient Hospital Stay (HOSPITAL_COMMUNITY): Payer: Medicare HMO

## 2015-04-03 LAB — GLUCOSE, CAPILLARY
GLUCOSE-CAPILLARY: 152 mg/dL — AB (ref 65–99)
GLUCOSE-CAPILLARY: 74 mg/dL (ref 65–99)
GLUCOSE-CAPILLARY: 83 mg/dL (ref 65–99)
GLUCOSE-CAPILLARY: 93 mg/dL (ref 65–99)
Glucose-Capillary: 74 mg/dL (ref 65–99)
Glucose-Capillary: 66 mg/dL (ref 65–99)
Glucose-Capillary: 81 mg/dL (ref 65–99)

## 2015-04-03 LAB — CBC
HEMATOCRIT: 25.4 % — AB (ref 39.0–52.0)
Hemoglobin: 8.3 g/dL — ABNORMAL LOW (ref 13.0–17.0)
MCH: 26.5 pg (ref 26.0–34.0)
MCHC: 32.7 g/dL (ref 30.0–36.0)
MCV: 81.2 fL (ref 78.0–100.0)
Platelets: 101 10*3/uL — ABNORMAL LOW (ref 150–400)
RBC: 3.13 MIL/uL — ABNORMAL LOW (ref 4.22–5.81)
RDW: 18 % — AB (ref 11.5–15.5)
WBC: 7.4 10*3/uL (ref 4.0–10.5)

## 2015-04-03 LAB — POCT I-STAT 3, ART BLOOD GAS (G3+)
Acid-base deficit: 1 mmol/L (ref 0.0–2.0)
Bicarbonate: 23.3 mEq/L (ref 20.0–24.0)
O2 Saturation: 99 %
PH ART: 7.402 (ref 7.350–7.450)
TCO2: 24 mmol/L (ref 0–100)
pCO2 arterial: 37.1 mmHg (ref 35.0–45.0)
pO2, Arterial: 116 mmHg — ABNORMAL HIGH (ref 80.0–100.0)

## 2015-04-03 LAB — BASIC METABOLIC PANEL
ANION GAP: 13 (ref 5–15)
BUN: 30 mg/dL — AB (ref 6–20)
CALCIUM: 8 mg/dL — AB (ref 8.9–10.3)
CO2: 23 mmol/L (ref 22–32)
Chloride: 101 mmol/L (ref 101–111)
Creatinine, Ser: 7.73 mg/dL — ABNORMAL HIGH (ref 0.61–1.24)
GFR calc Af Amer: 8 mL/min — ABNORMAL LOW (ref >60)
GFR calc non Af Amer: 7 mL/min — ABNORMAL LOW (ref >60)
GLUCOSE: 79 mg/dL (ref 65–99)
POTASSIUM: 3.9 mmol/L (ref 3.5–5.1)
Sodium: 137 mmol/L (ref 135–145)

## 2015-04-03 MED ORDER — DEXTROSE 50 % IV SOLN
1.0000 | Freq: Once | INTRAVENOUS | Status: AC
  Administered 2015-04-03: 50 mL via INTRAVENOUS

## 2015-04-03 MED ORDER — MORPHINE SULFATE (PF) 2 MG/ML IV SOLN
2.0000 mg | INTRAVENOUS | Status: DC | PRN
  Administered 2015-04-03 – 2015-04-07 (×17): 2 mg via INTRAVENOUS
  Filled 2015-04-03 (×18): qty 1

## 2015-04-03 MED ORDER — CLONIDINE HCL 0.3 MG/24HR TD PTWK
0.3000 mg | MEDICATED_PATCH | TRANSDERMAL | Status: DC
  Administered 2015-04-03 – 2015-04-10 (×2): 0.3 mg via TRANSDERMAL
  Filled 2015-04-03 (×2): qty 1

## 2015-04-03 MED ORDER — PANTOPRAZOLE SODIUM 40 MG PO PACK
40.0000 mg | PACK | Freq: Every day | ORAL | Status: DC
  Administered 2015-04-03: 40 mg
  Filled 2015-04-03 (×2): qty 20

## 2015-04-03 MED ORDER — LABETALOL HCL 5 MG/ML IV SOLN
10.0000 mg | INTRAVENOUS | Status: DC | PRN
  Administered 2015-04-04 – 2015-04-07 (×3): 10 mg via INTRAVENOUS
  Filled 2015-04-03 (×3): qty 4

## 2015-04-03 MED ORDER — DEXTROSE 50 % IV SOLN
INTRAVENOUS | Status: AC
  Filled 2015-04-03: qty 50

## 2015-04-03 NOTE — Progress Notes (Signed)
Hypoglycemic Event  CBG: 66  Treatment: D50 IV 50 mL  Symptoms: UTA d/t sedation  Follow-up CBG: Time:1651 CBG Result:152   Possible Reasons for Event: Inadequate meal intake    [name] [name]  Remember to initiate Hypoglycemia Order Set & complete

## 2015-04-03 NOTE — Progress Notes (Signed)
TCTS BRIEF SICU PROGRESS NOTE  3 Days Post-Op  S/P Procedure(s) (LRB): REPAIR OF ASCENDING AORTIC DISSECTION, HYPOTHERMIC CIRCULATORY ARREST, RIGHT AXILLARY CANNULATION (N/A)   No changes Still agitated w/ attempts to wean vent and sedation Dialysis has not been started yet  Plan: Keep on vent for now.  Needs dialysis for volume removal.  Consider vent wean tomorrow depending on how he does.  Rexene Alberts 04/03/2015 5:13 PM

## 2015-04-03 NOTE — Progress Notes (Signed)
Pt still has not been dialyzed yet today. Dailysis department notified that Dr. Roxy Manns is wanting patient dialyzed this evening so we can begin to wean pt. Dialysis RN states that it will be very late tonight, if not tomorrow, before they will be able to get to the pt. Dr. Jonnie Finner paged at [phone], [phone], and again at 1800 to be made aware that the patient has not gone for dialysis. Dr. Jonnie Finner returned page at [phone] and states that he will take care of the matter.  Vena Austria 04/03/2015 6:16 PM

## 2015-04-03 NOTE — Progress Notes (Addendum)
[address]       [place] [address]             [phone]        CARDIOTHORACIC SURGERY PROGRESS NOTE   R3 Days Post-Op Procedure(s) (LRB): REPAIR OF ASCENDING AORTIC DISSECTION, HYPOTHERMIC CIRCULATORY ARREST, RIGHT AXILLARY CANNULATION (N/A)  Subjective: Currently sedated on vent.  Reportedly has woken up and followed some simple commands but has remained agitated and thrashing around with markedly elevated BP when more awake  Objective: Vital signs: BP Readings from Last 1 Encounters:  04/03/15 106/63   Pulse Readings from Last 1 Encounters:  04/03/15 62   Resp Readings from Last 1 Encounters:  04/03/15 12   Temp Readings from Last 1 Encounters:  04/03/15 96.8 F (36 C)     Hemodynamics: PAP: (20-34)/(11-22) 20/11 mmHg CO:  [4.1 L/min-5.6 L/min] 4.1 L/min CI:  [2 L/min/m2-2.7 L/min/m2] 2 L/min/m2  Physical Exam:  Rhythm:   sinus  Breath sounds: Scattered rhonchi  Heart sounds:  RRR  Incisions:  Dressing dry, intact  Abdomen:  Soft, non-distended, non-tender  Extremities:  Warm, well-perfused  Chest tubes:  Low volume thin serosanguinous output but still draining, no air leak    Intake/Output from previous day: 08/26 0701 - 08/27 0700 In: 1629.9 [I.V.:1319.9; NG/GT:160; IV Piggyback:150] Out: 1260 [Emesis/NG output:300; Chest Tube:460] Intake/Output this shift: Total I/O In: 183.7 [I.V.:153.7; NG/GT:30] Out: 380 [Emesis/NG output:300; Chest Tube:80]  Lab Results:  CBC: Recent Labs  04/02/15 1923 04/02/15 1929 04/03/15 0400  WBC 8.1  --  7.4  HGB 8.3* 9.2* 8.3*  HCT 25.3* 27.0* 25.4*  PLT 86*  --  101*    BMET:  Recent Labs  04/02/15 1307  04/02/15 1929 04/03/15 0400  NA 134*  --  135 137  K 5.5*  --  3.6 3.9  CL 104  --  102 101  CO2 15*  --   --  23  GLUCOSE 105*  --  93 79  BUN 48*  --  25* 30*  CREATININE 10.67*  < > 6.70* 7.73*  CALCIUM 7.9*  --   --  8.0*  < > = values in this interval not displayed.    PT/INR:   Recent Labs  04/01/15 0826  LABPROT 18.3*  INR 1.52*    CBG (last 3)   Recent Labs  04/03/15 0007 04/03/15 0427 04/03/15 0807  GLUCAP 93 83 74    ABG    Component Value Date/Time   PHART 7.402 04/03/2015 0410   PCO2ART 37.1 04/03/2015 0410   PO2ART 116.0* 04/03/2015 0410   HCO3 23.3 04/03/2015 0410   TCO2 24 04/03/2015 0410   ACIDBASEDEF 1.0 04/03/2015 0410   O2SAT 99.0 04/03/2015 0410    CXR: PORTABLE CHEST - 1 VIEW  COMPARISON: 04/02/2015  FINDINGS: Prominence of the aortic arch is stable from the prior exam.  There is no mediastinal widening. Cardiac silhouette is mildly enlarged.  There is persistent lung base opacity consistent with atelectasis likely with small effusions. There is no pulmonary edema. No pneumothorax.  Endotracheal tube, right internal jugular dual-lumen central venous catheter, left internal jugular Swan-Ganz, a mediastinal tube, orogastric tube and bilateral chest tubes are stable from the prior exam.  IMPRESSION: 1. No acute findings or evidence of an operative complication. 2. Persistent lung base opacity likely combination of atelectasis and small effusions. 3. Support apparatus is stable and well positioned. 4. No change from the previous day's study.  Electronically Signed  By: Lajean Manes M.D.  On: 04/03/2015 09:28  Assessment/Plan: S/P Procedure(s) (LRB): REPAIR OF ASCENDING AORTIC DISSECTION, HYPOTHERMIC CIRCULATORY ARREST, RIGHT AXILLARY CANNULATION (N/A)  Overall stable POD3 Maintaining NSR w/ stable hemodynamics but still intermittently hypertensive particularly when more alert Post op agitation and delirium persists but non-focal neuro exam Expected post op acute blood loss anemia, stable Expected post op volume excess, for HD today ESRD on HD, electrolytes okay   For HD again today  Try to use Precedex as needed for agitation and minimize narcotics and other sedatives  Keep on  vent today  Keep chest tubes in today  Hydralazine and/or labetalol as needed for hypertension  Add clonidine patch   Matthew Kane 04/03/2015 11:07 AM

## 2015-04-03 NOTE — Progress Notes (Signed)
Attempted to wean pt on vent.  Pt became immediately agitated, kicking legs and thrashing around in bed.  Placed back on full vent support, RN aware.

## 2015-04-03 NOTE — Progress Notes (Signed)
[facility] Progress Note   Subjective: on vent still, cxr ok today; requiring lots of sedation due to agitation  Filed Vitals:   04/03/15 0800 04/03/15 0820 04/03/15 0900 04/03/15 1000  BP: 140/90 135/72 181/115 106/63  Pulse: 80 60 83 62  Temp: 96.8 F (36 C)  96.6 F (35.9 C) 96.8 F (36 C)  TempSrc: Core (Comment)     Resp: 11 12 25 12   Height:      Weight:      SpO2: 100% 100% 100% 100%   Dialyzes at [facility] TTS EDW 88   Bath 2 K/2 ca, Dialyzer 180, Heparin yes. Access PC. Hep 10K Calcitriol 1 mcg per tx Mircera 75- given 8/23 Venofer 100 per tx for 5  Exam: General appearance: sedated, intubated Resp: clear ant and lat bilat Cardio: RRR no RG GI: soft, ntnd no ascites Ext: mild edema of extremities PC in right chest  Assessment 1 Ao dissection 2 ESRD 3 HTN on IV ntg 4 Anemia  5 MBD  6 Vol excess mild up 2-3 kg 7 VDRF   Plan - HD today again, UF 2kg if BP > 100    Rob Eilan Mcinerny MD  pager [phone]    cell [phone]  04/03/2015, 10:29 AM     Recent Labs Lab 04/02/15 0440 04/02/15 1307 04/02/15 1923 04/02/15 1929 04/03/15 0400  NA 133* 134*  --  135 137  K 5.3* 5.5*  --  3.6 3.9  CL 101 104  --  102 101  CO2 19* 15*  --   --  23  GLUCOSE 116* 105*  --  93 79  BUN 43* 48*  --  25* 30*  CREATININE 10.38* 10.67* 6.72* 6.70* 7.73*  CALCIUM 7.7* 7.9*  --   --  8.0*  PHOS  --  8.3*  --   --   --     Recent Labs Lab 04/02/15 1307  ALBUMIN 2.5*    Recent Labs Lab 04/02/15 1307 04/02/15 1923 04/02/15 1929 04/03/15 0400  WBC 8.8 8.1  --  7.4  HGB 7.9* 8.3* 9.2* 8.3*  HCT 24.9* 25.3* 27.0* 25.4*  MCV 82.7 81.6  --  81.2  PLT 102* 86*  --  101*   . acetaminophen  1,000 mg Oral 4 times per day   Or  . acetaminophen (TYLENOL) oral liquid 160 mg/5 mL  1,000 mg Per Tube 4 times per day  . allopurinol  100 mg Oral QPM  . antiseptic oral rinse  7 mL Mouth Rinse QID  . aspirin EC  325 mg Oral Daily   Or  . aspirin  324 mg Per  Tube Daily  . bisacodyl  10 mg Oral Daily   Or  . bisacodyl  10 mg Rectal Daily  . calcitRIOL  0.5 mcg Oral Daily  . calcium carbonate  800 mg of elemental calcium Oral BID WC  . chlorhexidine  15 mL Mouth/Throat BID  . docusate sodium  200 mg Oral Daily  . insulin aspart  0-24 Units Subcutaneous 6 times per day  . metoprolol tartrate  12.5 mg Oral BID   Or  . metoprolol tartrate  12.5 mg Per Tube BID  . multivitamin  1 tablet Oral QHS  . pantoprazole  40 mg Oral Daily  . pantoprazole sodium  40 mg Per Tube Daily  . sodium chloride  3 mL Intravenous Q12H   . sodium chloride 20 mL/hr at 04/03/15 1000  . sodium chloride    .  sodium chloride 20 mL/hr at 04/01/15 0955  . dexmedetomidine 1.2 mcg/kg/hr (04/03/15 1000)  . lactated ringers    . lactated ringers    . nitroGLYCERIN 20 mcg/min (04/03/15 1000)  . phenylephrine (NEO-SYNEPHRINE) Adult infusion Stopped (04/01/15 0700)   sodium chloride, sodium chloride, sodium chloride, alteplase, calcium carbonate, feeding supplement (NEPRO CARB STEADY), heparin, hydrALAZINE, lactated ringers, lidocaine (PF), lidocaine-prilocaine, metoprolol, midazolam, morphine injection, ondansetron (ZOFRAN) IV, oxyCODONE, pentafluoroprop-tetrafluoroeth, sodium chloride, traMADol

## 2015-04-04 ENCOUNTER — Inpatient Hospital Stay (HOSPITAL_COMMUNITY): Payer: Medicare HMO

## 2015-04-04 LAB — BASIC METABOLIC PANEL
Anion gap: 12 (ref 5–15)
BUN: 27 mg/dL — ABNORMAL HIGH (ref 6–20)
CHLORIDE: 102 mmol/L (ref 101–111)
CO2: 24 mmol/L (ref 22–32)
CREATININE: 6.9 mg/dL — AB (ref 0.61–1.24)
Calcium: 8 mg/dL — ABNORMAL LOW (ref 8.9–10.3)
GFR calc non Af Amer: 8 mL/min — ABNORMAL LOW (ref >60)
GFR, EST AFRICAN AMERICAN: 9 mL/min — AB (ref >60)
GLUCOSE: 78 mg/dL (ref 65–99)
Potassium: 4 mmol/L (ref 3.5–5.1)
Sodium: 138 mmol/L (ref 135–145)

## 2015-04-04 LAB — GLUCOSE, CAPILLARY
GLUCOSE-CAPILLARY: 140 mg/dL — AB (ref 65–99)
GLUCOSE-CAPILLARY: 76 mg/dL (ref 65–99)
Glucose-Capillary: 103 mg/dL — ABNORMAL HIGH (ref 65–99)
Glucose-Capillary: 66 mg/dL (ref 65–99)
Glucose-Capillary: 72 mg/dL (ref 65–99)
Glucose-Capillary: 76 mg/dL (ref 65–99)
Glucose-Capillary: 79 mg/dL (ref 65–99)

## 2015-04-04 LAB — POCT I-STAT 3, ART BLOOD GAS (G3+)
ACID-BASE DEFICIT: 2 mmol/L (ref 0.0–2.0)
ACID-BASE DEFICIT: 3 mmol/L — AB (ref 0.0–2.0)
ACID-BASE DEFICIT: 2 mmol/L (ref 0.0–2.0)
BICARBONATE: 23.2 meq/L (ref 20.0–24.0)
BICARBONATE: 22.8 meq/L (ref 20.0–24.0)
Bicarbonate: 21.4 mEq/L (ref 20.0–24.0)
O2 SAT: 99 %
O2 Saturation: 98 %
O2 Saturation: 97 %
PCO2 ART: 36.9 mmHg (ref 35.0–45.0)
PCO2 ART: 39.9 mmHg (ref 35.0–45.0)
PH ART: 7.408 (ref 7.350–7.450)
PH ART: 7.366 (ref 7.350–7.450)
PO2 ART: 91 mmHg (ref 80.0–100.0)
PO2 ART: 94 mmHg (ref 80.0–100.0)
Patient temperature: 35.9
Patient temperature: 37.3
TCO2: 24 mmol/L (ref 0–100)
TCO2: 22 mmol/L (ref 0–100)
TCO2: 24 mmol/L (ref 0–100)
pCO2 arterial: 36.4 mmHg (ref 35.0–45.0)
pH, Arterial: 7.372 (ref 7.350–7.450)
pO2, Arterial: 125 mmHg — ABNORMAL HIGH (ref 80.0–100.0)

## 2015-04-04 LAB — CBC
HCT: 24.3 % — ABNORMAL LOW (ref 39.0–52.0)
Hemoglobin: 8 g/dL — ABNORMAL LOW (ref 13.0–17.0)
MCH: 26.8 pg (ref 26.0–34.0)
MCHC: 32.9 g/dL (ref 30.0–36.0)
MCV: 81.5 fL (ref 78.0–100.0)
PLATELETS: 76 10*3/uL — AB (ref 150–400)
RBC: 2.98 MIL/uL — AB (ref 4.22–5.81)
RDW: 18.2 % — ABNORMAL HIGH (ref 11.5–15.5)
WBC: 7.3 10*3/uL (ref 4.0–10.5)

## 2015-04-04 MED ORDER — NEPRO/CARBSTEADY PO LIQD: 237.0000 mL | ORAL | Status: DC | PRN

## 2015-04-04 MED ORDER — ALTEPLASE 2 MG IJ SOLR
2.0000 mg | Freq: Once | INTRAMUSCULAR | Status: DC | PRN
Start: 2015-04-04 — End: 2015-04-04

## 2015-04-04 MED ORDER — INSULIN ASPART 100 UNIT/ML ~~LOC~~ SOLN: 0.0000 [IU] | SUBCUTANEOUS | Status: DC

## 2015-04-04 MED ORDER — PANTOPRAZOLE SODIUM 40 MG PO TBEC
40.0000 mg | DELAYED_RELEASE_TABLET | Freq: Every day | ORAL | Status: DC
  Administered 2015-04-07 – 2015-04-10 (×4): 40 mg via ORAL
  Filled 2015-04-04 (×4): qty 1

## 2015-04-04 MED ORDER — METOPROLOL TARTRATE 25 MG PO TABS
25.0000 mg | ORAL_TABLET | Freq: Two times a day (BID) | ORAL | Status: DC
  Administered 2015-04-06 – 2015-04-07 (×2): 25 mg via ORAL
  Filled 2015-04-04 (×7): qty 1

## 2015-04-04 MED ORDER — LIDOCAINE-PRILOCAINE 2.5-2.5 % EX CREA: 1.0000 "application " | TOPICAL_CREAM | CUTANEOUS | Status: DC | PRN

## 2015-04-04 MED ORDER — HEPARIN SODIUM (PORCINE) 1000 UNIT/ML DIALYSIS: 1000.0000 [IU] | INTRAMUSCULAR | Status: DC | PRN

## 2015-04-04 MED ORDER — SODIUM CHLORIDE 0.9 % IV SOLN: 100.0000 mL | INTRAVENOUS | Status: DC | PRN

## 2015-04-04 MED ORDER — LIDOCAINE HCL (PF) 1 % IJ SOLN: 5.0000 mL | INTRAMUSCULAR | Status: DC | PRN

## 2015-04-04 MED ORDER — DEXTROSE 50 % IV SOLN
1.0000 | Freq: Once | INTRAVENOUS | Status: AC
  Administered 2015-04-04: 50 mL via INTRAVENOUS
  Filled 2015-04-04: qty 50

## 2015-04-04 MED ORDER — NEPRO/CARBSTEADY PO LIQD
237.0000 mL | ORAL | Status: DC | PRN
  Filled 2015-04-04: qty 237

## 2015-04-04 MED ORDER — LIDOCAINE HCL (PF) 1 % IJ SOLN
5.0000 mL | INTRAMUSCULAR | Status: DC | PRN
Start: 2015-04-04 — End: 2015-04-04

## 2015-04-04 MED ORDER — PENTAFLUOROPROP-TETRAFLUOROETH EX AERO: 1.0000 "application " | INHALATION_SPRAY | CUTANEOUS | Status: DC | PRN

## 2015-04-04 MED ORDER — LIDOCAINE-PRILOCAINE 2.5-2.5 % EX CREA
1.0000 "application " | TOPICAL_CREAM | CUTANEOUS | Status: DC | PRN
  Filled 2015-04-04: qty 5

## 2015-04-04 MED ORDER — ALTEPLASE 2 MG IJ SOLR
2.0000 mg | Freq: Once | INTRAMUSCULAR | Status: DC | PRN
  Filled 2015-04-04: qty 2

## 2015-04-04 MED ORDER — DEXTROSE 50 % IV SOLN
INTRAVENOUS | Status: AC
  Administered 2015-04-04: 50 mL via INTRAVENOUS
  Filled 2015-04-04: qty 50

## 2015-04-04 NOTE — Procedures (Signed)
Extubation Procedure Note  Patient Details:   Name: Matthew Kane DOB: 01-29-1951 MRN: [id number]   Airway Documentation:   + air leak test prior to extubation  Evaluation  O2 sats: stable throughout Complications: No apparent complications Patient did tolerate procedure well. Bilateral Breath Sounds: Diminished, Clear Suctioning: Oral, Airway Yes, pt able to speak.  No stridor noted, no distress noted.  Pt denies SOB.  BSSH clear dim.   Lenna Sciara 04/04/2015, 10:55 AM

## 2015-04-04 NOTE — Progress Notes (Signed)
TCTS BRIEF SICU PROGRESS NOTE  4 Days Post-Op  S/P Procedure(s) (LRB): REPAIR OF ASCENDING AORTIC DISSECTION, HYPOTHERMIC CIRCULATORY ARREST, RIGHT AXILLARY CANNULATION (N/A)   Extubated uneventfully Awake and alert, follows commands.  Reportedly somewhat confused intermittently Voice is hoarse and weak NSR w/ stable BP although still on IV NTG for hypertension  Plan: Will keep NPO for now.  Mobilize.  Rexene Alberts 04/04/2015 7:26 PM

## 2015-04-04 NOTE — Progress Notes (Signed)
Hypoglycemic Event  CBG: 66  Treatment: D50 IV 50 mL  Symptoms: UTA d/t sedation and intubation  Follow-up CBG: [id number] CBG Result:140     Possible Reasons for Event: Inadequate meal intake    Matthew Kane  Remember to initiate Hypoglycemia Order Set & complete

## 2015-04-04 NOTE — Progress Notes (Signed)
  [facility] Progress Note   Subjective: on vent still, cxr ok today; requiring lots of sedation due to agitation  Filed Vitals:   04/04/15 1045 04/04/15 1100 04/04/15 1200 04/04/15 1300  BP:  132/73 143/82 137/87  Pulse: 92 88 88 87  Temp:   99.1 F (37.3 C)   TempSrc:      Resp: 26 28 25 23   Height:      Weight:      SpO2: 100% 100% 100% 100%   Dialyzes at [facility] TTS EDW 88   Bath 2 K/2 ca, Dialyzer 180, Heparin yes. Access PC. Hep 10K Calcitriol 1 mcg per tx Mircera 75- given 8/23 Venofer 100 per tx for 5  Exam: Gen: off vent, responding to questions Resp: clear ant and lat bilat Cardio: RRR no RG GI: soft, ntnd no ascites Ext: mild edema of extremities PC in right chest  Assessment 1 Ao dissection 2 ESRD HD yest pm 3 HTN BP up, on IV ntg / clon patch/ MTP po 4 Anemia Hb 8 5 Vol excess up 3-4kg   Plan - HD Monday am in ICU, get vol down should help w BP    Kelly Splinter MD  pager [phone]    cell [phone]  04/04/2015, 2:12 PM     Recent Labs Lab 04/02/15 1307  04/02/15 1929 04/03/15 0400 04/04/15 0428  NA 134*  --  135 137 138  K 5.5*  --  3.6 3.9 4.0  CL 104  --  102 101 102  CO2 15*  --   --  23 24  GLUCOSE 105*  --  93 79 78  BUN 48*  --  25* 30* 27*  CREATININE 10.67*  < > 6.70* 7.73* 6.90*  CALCIUM 7.9*  --   --  8.0* 8.0*  PHOS 8.3*  --   --   --   --   < > = values in this interval not displayed.  Recent Labs Lab 04/02/15 1307  ALBUMIN 2.5*    Recent Labs Lab 04/02/15 1923 04/02/15 1929 04/03/15 0400 04/04/15 0428  WBC 8.1  --  7.4 7.3  HGB 8.3* 9.2* 8.3* 8.0*  HCT 25.3* 27.0* 25.4* 24.3*  MCV 81.6  --  81.2 81.5  PLT 86*  --  101* 76*   . acetaminophen  1,000 mg Oral 4 times per day  . allopurinol  100 mg Oral QPM  . antiseptic oral rinse  7 mL Mouth Rinse QID  . aspirin EC  325 mg Oral Daily  . bisacodyl  10 mg Oral Daily   Or  . bisacodyl  10 mg Rectal Daily  . calcitRIOL  0.5 mcg Oral Daily  .  calcium carbonate  800 mg of elemental calcium Oral BID WC  . chlorhexidine  15 mL Mouth/Throat BID  . cloNIDine  0.3 mg Transdermal Weekly  . docusate sodium  200 mg Oral Daily  . insulin aspart  0-24 Units Subcutaneous 6 times per day  . metoprolol tartrate  25 mg Oral BID  . multivitamin  1 tablet Oral QHS  . pantoprazole  40 mg Oral Daily  . sodium chloride  3 mL Intravenous Q12H   . sodium chloride    . nitroGLYCERIN 10 mcg/min (04/04/15 1300)   sodium chloride, sodium chloride, alteplase, calcium carbonate, feeding supplement (NEPRO CARB STEADY), heparin, hydrALAZINE, labetalol, lidocaine (PF), lidocaine-prilocaine, metoprolol, morphine injection, ondansetron (ZOFRAN) IV, oxyCODONE, pentafluoroprop-tetrafluoroeth, sodium chloride, traMADol

## 2015-04-04 NOTE — Progress Notes (Addendum)
TCTS DAILY ICU PROGRESS NOTE                   [address]            [place] [address]          [phone]   4 Days Post-Op Procedure(s) (LRB): REPAIR OF ASCENDING AORTIC DISSECTION, HYPOTHERMIC CIRCULATORY ARREST, RIGHT AXILLARY CANNULATION (N/A)  Total Length of Stay:  LOS: 4 days   Subjective: Remains on vent and is awake. Slightly agitated this am. He had HD last last evening.  Objective: Vital signs in last 24 hours: Temp:  [96.3 F (35.7 C)-99.3 F (37.4 C)] 99 F (37.2 C) (08/28 0900) Pulse Rate:  [58-88] 87 (08/28 0900) Cardiac Rhythm:  [-] Atrial paced (08/28 0800) Resp:  [11-25] 25 (08/28 0900) BP: (92-149)/(56-100) 123/65 mmHg (08/28 0900) SpO2:  [99 %-100 %] 99 % (08/28 0900) Arterial Line BP: (101-148)/(61-90) 117/82 mmHg (08/28 0900) FiO2 (%):  [40 %] 40 % (08/28 0835) Weight:  [204 lb 2.3 oz (92.6 kg)-207 lb 0.2 oz (93.9 kg)] 204 lb 2.3 oz (92.6 kg) (08/28 0225)  Filed Weights   04/03/15 0345 04/03/15 2310 04/04/15 0225  Weight: 199 lb 11.8 oz (90.6 kg) 207 lb 0.2 oz (93.9 kg) 204 lb 2.3 oz (92.6 kg)    Weight change: 10.6 oz (0.3 kg)   Hemodynamic parameters for last 24 hours: PAP: (14-28)/(6-17) 24/15 mmHg CO:  [3.6 L/min-7.2 L/min] 7.2 L/min CI:  [1.7 L/min/m2-3.4 L/min/m2] 3.4 L/min/m2  Intake/Output from previous day: 08/27 0701 - 08/28 0700 In: 1319.8 [I.V.:939.8; NG/GT:380] Out: 2310 [Emesis/NG output:750; Chest Tube:250]  Intake/Output this shift: Total I/O In: 86.5 [I.V.:56.5; NG/GT:30] Out: 200 [Emesis/NG output:140; Chest Tube:60]  Current Meds: Scheduled Meds: . acetaminophen  1,000 mg Oral 4 times per day   Or  . acetaminophen (TYLENOL) oral liquid 160 mg/5 mL  1,000 mg Per Tube 4 times per day  . allopurinol  100 mg Oral QPM  . antiseptic oral rinse  7 mL Mouth Rinse QID  . aspirin EC  325 mg Oral Daily   Or  . aspirin  324 mg Per Tube Daily  . bisacodyl  10 mg Oral Daily   Or  . bisacodyl  10 mg  Rectal Daily  . calcitRIOL  0.5 mcg Oral Daily  . calcium carbonate  800 mg of elemental calcium Oral BID WC  . chlorhexidine  15 mL Mouth/Throat BID  . cloNIDine  0.3 mg Transdermal Weekly  . docusate sodium  200 mg Oral Daily  . insulin aspart  0-24 Units Subcutaneous 6 times per day  . metoprolol tartrate  12.5 mg Oral BID   Or  . metoprolol tartrate  12.5 mg Per Tube BID  . multivitamin  1 tablet Oral QHS  . pantoprazole sodium  40 mg Per Tube Daily  . sodium chloride  3 mL Intravenous Q12H   Continuous Infusions: . sodium chloride    . dexmedetomidine 0.6 mcg/kg/hr (04/04/15 0900)  . nitroGLYCERIN 20 mcg/min (04/04/15 0900)   PRN Meds:.sodium chloride, sodium chloride, alteplase, calcium carbonate, feeding supplement (NEPRO CARB STEADY), heparin, hydrALAZINE, labetalol, lactated ringers, lidocaine (PF), lidocaine-prilocaine, metoprolol, midazolam, morphine injection, ondansetron (ZOFRAN) IV, oxyCODONE, pentafluoroprop-tetrafluoroeth, sodium chloride, traMADol  General appearance: alert and no distress Heart: RRR, rub with chest tubes in place Lungs: Diminshed at bases Abdomen: soft, non-tender; bowel sounds normal; no masses,  no organomegaly Extremities: SCDs in place;Feet warm and palpable pulses in feet  Wound:  Clean and dry  Lab Results: CBC: Recent Labs  04/03/15 0400 04/04/15 0428  WBC 7.4 7.3  HGB 8.3* 8.0*  HCT 25.4* 24.3*  PLT 101* 76*   BMET:  Recent Labs  04/03/15 0400 04/04/15 0428  NA 137 138  K 3.9 4.0  CL 101 102  CO2 23 24  GLUCOSE 79 78  BUN 30* 27*  CREATININE 7.73* 6.90*  CALCIUM 8.0* 8.0*    PT/INR: No results for input(s): LABPROT, INR in the last 72 hours. Radiology: Dg Chest Port 1 View  04/04/2015   CLINICAL DATA:  Atelectasis, shortness of breath, history hypertension, end-stage renal disease on dialysis, obstructive sleep apnea  EXAM: PORTABLE CHEST - 1 VIEW  COMPARISON:  Portable exam 0556 hours compared to 04/03/2015  FINDINGS:  Tip of endotracheal tube projects 5.6 cm above carina.  Nasogastric tube extends into stomach.  RIGHT jugular dual-lumen central venous catheter with tip projecting over cavoatrial junction.  LEFT jugular Swan-Ganz catheter with tip projecting over main pulmonary artery.  BILATERAL thoracostomy tubes and mediastinal drain unchanged.  Enlargement of cardiac silhouette.  Persistent prominence of the aortic arch compatible with aortic enlargement in patient with known type A aortic dissection.  Persistent LEFT lower lobe atelectasis versus consolidation.  Mild atelectasis at RIGHT base.  No definite pleural effusion or pneumothorax.  IMPRESSION: Postoperative changes as above.   Electronically Signed   By: Lavonia Dana M.D.   On: 04/04/2015 09:18     Assessment/Plan: S/P Procedure(s) (LRB): REPAIR OF ASCENDING AORTIC DISSECTION, HYPOTHERMIC CIRCULATORY ARREST, RIGHT AXILLARY CANNULATION (N/A)  1.CV-HR in the 80's. On Catapres 0.3 TD, Lopressor 12.5 mg bid, and Nitro drip. 2.Pulmonary-On vent and slowly being weaned. Hope to extubate. CXR this am shows no pneumothorax, bibasilar atelectasis L>R. Chest tubes with 250 cc last 24 hours. Hope to remove chest tubes later today. 3.Anemia-H and H 8 and 24.3 4.ESRD-nephrology following, HD per Dr. Jonnie Finner 5. Neuro-post op agitation and delirium less last 12 hours. Per nurse, has not needed to use much Precedex    ZIMMERMAN,DONIELLE M PA-C 04/04/2015 10:04 AM  I have seen and examined the patient and agree with the assessment and plan as outlined.  Looks much better today.  Awake on vent and following commands.  Will proceed with acute vent wean and possible extubation  Rexene Alberts 04/04/2015 11:03 AM

## 2015-04-05 ENCOUNTER — Inpatient Hospital Stay (HOSPITAL_COMMUNITY): Payer: Medicare HMO

## 2015-04-05 LAB — TYPE AND SCREEN
ABO/RH(D): B POS
Antibody Screen: NEGATIVE
UNIT DIVISION: 0
UNIT DIVISION: 0
UNIT DIVISION: 0
UNIT DIVISION: 0
UNIT DIVISION: 0
Unit division: 0

## 2015-04-05 LAB — GLUCOSE, CAPILLARY
GLUCOSE-CAPILLARY: 88 mg/dL (ref 65–99)
Glucose-Capillary: 96 mg/dL (ref 65–99)
Glucose-Capillary: 98 mg/dL (ref 65–99)

## 2015-04-05 LAB — CBC
HEMATOCRIT: 25 % — AB (ref 39.0–52.0)
HEMOGLOBIN: 8.2 g/dL — AB (ref 13.0–17.0)
MCH: 26.8 pg (ref 26.0–34.0)
MCHC: 32.8 g/dL (ref 30.0–36.0)
MCV: 81.7 fL (ref 78.0–100.0)
Platelets: 153 10*3/uL (ref 150–400)
RBC: 3.06 MIL/uL — AB (ref 4.22–5.81)
RDW: 18.6 % — ABNORMAL HIGH (ref 11.5–15.5)
WBC: 11.6 10*3/uL — AB (ref 4.0–10.5)

## 2015-04-05 LAB — BASIC METABOLIC PANEL
ANION GAP: 14 (ref 5–15)
BUN: 38 mg/dL — ABNORMAL HIGH (ref 6–20)
CO2: 23 mmol/L (ref 22–32)
Calcium: 8.8 mg/dL — ABNORMAL LOW (ref 8.9–10.3)
Chloride: 103 mmol/L (ref 101–111)
Creatinine, Ser: 9.26 mg/dL — ABNORMAL HIGH (ref 0.61–1.24)
GFR calc Af Amer: 6 mL/min — ABNORMAL LOW (ref >60)
GFR, EST NON AFRICAN AMERICAN: 5 mL/min — AB (ref >60)
GLUCOSE: 103 mg/dL — AB (ref 65–99)
POTASSIUM: 4.2 mmol/L (ref 3.5–5.1)
Sodium: 140 mmol/L (ref 135–145)

## 2015-04-05 LAB — HEPATITIS B SURFACE ANTIGEN: HEP B S AG: NEGATIVE

## 2015-04-05 MED FILL — Potassium Chloride Inj 2 mEq/ML: INTRAVENOUS | Qty: 40 | Status: AC

## 2015-04-05 MED FILL — Magnesium Sulfate Inj 50%: INTRAMUSCULAR | Qty: 10 | Status: AC

## 2015-04-05 MED FILL — Heparin Sodium (Porcine) Inj 1000 Unit/ML: INTRAMUSCULAR | Qty: 30 | Status: AC

## 2015-04-05 NOTE — Care Management Note (Signed)
Case Management Note  Patient Details  Name: KETIH GOODIE MRN: [id number] Date of Birth: [birth year]  Subjective/Objective:    Patient lying in bed having HD.  States lives at home alone.  His plan for discharge is for him to go to his sisters house.  Gave me permission to talk with sister.  Talked with sister, [name].  States this info is wrong, he is unable to care for self at his house and she cannot have him at her house as she has a Oceanographer herself.  Set up meeting for 1pm tomorrow, 8-30 to discuss this more with brother and sister.                 Action/Plan:   Expected Discharge Date:                  Expected Discharge Plan:  Skilled Nursing Facility  In-House Referral:  Clinical Social Work  Discharge planning Services  CM Consult  Post Acute Care Choice:  NA Choice offered to:     DME Arranged:    DME Agency:     HH Arranged:    [place] Agency:     Status of Service:  In process, will continue to follow  Medicare Important Message Given:  Yes-second notification given Date Medicare IM Given:    Medicare IM give by:    Date Additional Medicare IM Given:    Additional Medicare Important Message give by:     If discussed at [place] of Stay Meetings, dates discussed:    Additional Comments:  Vergie Living, RN 04/05/2015, 1:48 PM

## 2015-04-05 NOTE — Progress Notes (Signed)
5 Days Post-Op Procedure(s) (LRB): REPAIR OF ASCENDING AORTIC DISSECTION, HYPOTHERMIC CIRCULATORY ARREST, RIGHT AXILLARY CANNULATION (N/A) Subjective:  Feels rough.  Objective: Vital signs in last 24 hours: Temp:  [97.4 F (36.3 C)-99.3 F (37.4 C)] 98.4 F (36.9 C) (08/29 0403) Pulse Rate:  [81-104] 97 (08/29 0700) Cardiac Rhythm:  [-] Normal sinus rhythm (08/29 0400) Resp:  [11-36] 19 (08/29 0700) BP: (119-156)/(65-98) 146/76 mmHg (08/29 0700) SpO2:  [93 %-100 %] 97 % (08/29 0700) Arterial Line BP: (112-180)/(52-92) 161/85 mmHg (08/29 0700) FiO2 (%):  [40 %] 40 % (08/28 0835) Weight:  [91.2 kg (201 lb 1 oz)] 91.2 kg (201 lb 1 oz) (08/29 0415)  Hemodynamic parameters for last 24 hours: PAP: (22-32)/(14-15) 32/14 mmHg CO:  [7.2 L/min] 7.2 L/min CI:  [3.4 L/min/m2] 3.4 L/min/m2  Intake/Output from previous day: 08/28 0701 - 08/29 0700 In: 286.5 [I.V.:256.5; NG/GT:30] Out: 361 [Emesis/NG output:290; Stool:1; Chest Tube:70] Intake/Output this shift:    General appearance: alert, cooperative and voice hoarse Neurologic: intact Heart: regular rate and rhythm, S1, S2 normal, no murmur, click, rub or gallop Lungs: clear to auscultation bilaterally Abdomen: soft, non-tender; bowel sounds normal; no masses,  no organomegaly Extremities: edema mild Wound: incisions ok  Lab Results:  Recent Labs  04/04/15 0428 04/05/15 0300  WBC 7.3 11.6*  HGB 8.0* 8.2*  HCT 24.3* 25.0*  PLT 76* 153   BMET:  Recent Labs  04/04/15 0428 04/05/15 0300  NA 138 140  K 4.0 4.2  CL 102 103  CO2 24 23  GLUCOSE 78 103*  BUN 27* 38*  CREATININE 6.90* 9.26*  CALCIUM 8.0* 8.8*    PT/INR: No results for input(s): LABPROT, INR in the last 72 hours. ABG    Component Value Date/Time   PHART 7.366 04/04/2015 1158   HCO3 22.8 04/04/2015 1158   TCO2 24 04/04/2015 1158   ACIDBASEDEF 2.0 04/04/2015 1158   O2SAT 99.0 04/04/2015 1158   CBG (last 3)   Recent Labs  04/04/15 2019  04/05/15 0037 04/05/15 0354  GLUCAP 103* 98 88   CXR: bibasilar atelectasis.  Assessment/Plan: S/P Procedure(s) (LRB): REPAIR OF ASCENDING AORTIC DISSECTION, HYPOTHERMIC CIRCULATORY ARREST, RIGHT AXILLARY CANNULATION (N/A)  He is hemodynamically stable but hypertensive. On clonidine patch, prn hydralazine and labetalol. He is NPO for now. Once he is taking po will start oral beta blocker.  ESRD on HD: scheduled for this am.  Mobilize out of bed after HD.  IS.  Hoarse voice. Will ask Speech Therapy to evaluate swallowing before starting po.   LOS: 5 days    Gaye Pollack 04/05/2015

## 2015-04-05 NOTE — Progress Notes (Signed)
      [address]       ,[place] [address]             [phone]       Up in chair  Still hoarse. Aspirating on speech eval- remains NPO  BP 149/81 mmHg  Pulse 102  Temp(Src) 98.3 F (36.8 C) (Oral)  Resp 20  Ht 6' (1.829 m)  Wt 215 lb 9.8 oz (97.8 kg)  BMI 29.24 kg/m2  SpO2 95%   Intake/Output Summary (Last 24 hours) at 04/05/15 1756 Last data filed at 04/05/15 1700  Gross per 24 hour  Intake 171.25 ml  Output   3151 ml  Net -2979.75 ml    Continue current care  Krishana Lutze C. Roxan Hockey, MD [facility] [phone]

## 2015-04-05 NOTE — Evaluation (Signed)
Clinical/Bedside Swallow Evaluation Patient Details  Name: Matthew Kane MRN: [id number] Date of Birth: May 09, 1951  Today's Date: 04/05/2015 Time: SLP Start Time (ACUTE ONLY): 7062 SLP Stop Time (ACUTE ONLY): 1400 SLP Time Calculation (min) (ACUTE ONLY): 15 min  Past Medical History:  Past Medical History  Diagnosis Date  . Hypertension     Echo in 10/2006-moderate LVH, normal EF  . Obesity   . Gout   . Gastroesophageal reflux disease   . Erectile dysfunction   . Prostatic hypertrophy, benign   . Allergic rhinitis   . Chronic kidney disease, stage 5, kidney failure      dialysis at [facility] T/TH/Sat; ESRD as of 05/2011; leg cramps; hypokalemia; microcytosis  . Secondary hyperparathyroidism of renal origin   . Anemia   . Dialysis patient   . History of blood transfusion   . Thrombocytopenia   . Elevated troponin   . Obstructive sleep apnea 2008    2008- uses CPAP  . Difficult intubation     Spasms after LMA inserted -12/13/10   Past Surgical History:  Past Surgical History  Procedure Laterality Date  . Total hip arthroplasty      bilateral  . Portacath placement    . Cholecystectomy    . Finger amputation      right middle  . Colonoscopy  07/12/2011    Procedure: COLONOSCOPY;  Surgeon: Dorothyann Peng, MD;  [facility];  Service: Endoscopy;  Laterality: N/A;  1:00  . Av fistula placement  05-31-11    Right radiocephalic AVF  . Colostomy  Pt denies having this surgery - (12/17/14.    + subsequent takedown secondary to colonic rupture related to trauma  . Joint replacement      times 3  . Av fistula placement  04/01/2012    Procedure: INSERTION OF ARTERIOVENOUS (AV) GORE-TEX GRAFT ARM;  Surgeon: Elam Dutch, MD;  Location: [facility] OR;  Service: Vascular;  Laterality: Left;  . Revison of arteriovenous fistula Left 05/06/2014    Procedure: REVISON OF ARTERIOVENOUS GRAFT;  Surgeon: Conrad Bairdstown, MD;  Location: [place];  Service: Vascular;  Laterality: Left;  .  Shuntogram N/A 10/06/2011    Procedure: Earney Mallet;  Surgeon: Elam Dutch, MD;  Location: [facility] CATH LAB;  Service: Cardiovascular;  Laterality: N/A;  . Insertion of dialysis catheter Right 12/18/2014    Procedure: ULTRASOUND GUIDED INSERTION OF RIGHT INTERNAL JUGULAR DIALYSIS CATHETER;  Surgeon: Angelia Mould, MD;  Location: [place];  Service: Vascular;  Laterality: Right;  . Hematoma evacuation Left 12/18/2014    Procedure: EVACUATION OF LEFT ARM LYMPHOCELE;  Surgeon: Angelia Mould, MD;  Location: [place];  Service: Vascular;  Laterality: Left;  . Av fistula placement Left 12/18/2014    Procedure: REVISION OF LEFT ARM ARTERIOVENOUS (AV) GORE-TEX GRAFT WITH INTERPOSITION OF 51mm GRAFT;  Surgeon: Angelia Mould, MD;  Location: [place];  Service: Vascular;  Laterality: Left;  . Avgg removal Left 02/19/2015    Procedure: LIGATION OF ARTERIOVENOUS GORETEX GRAFT LEFT FOREARM;  Surgeon: Angelia Mould, MD;  Location: [place];  Service: Vascular;  Laterality: Left;  . Hematoma evacuation Left 02/19/2015    Procedure: EVACUATION LYMPHOCELE LEFT FOREARM;  Surgeon: Angelia Mould, MD;  Location: [place];  Service: Vascular;  Laterality: Left;  . Cardiac catheterization N/A 03/08/2015    Procedure: Left Heart Cath and Coronary Angiography;  Surgeon: Burnell Blanks, MD;  Location: [place] CV LAB;  Service: Cardiovascular;  Laterality:  N/A;  . Ascending aortic root replacement N/A 03/21/2015    Procedure: REPAIR OF ASCENDING AORTIC DISSECTION, HYPOTHERMIC CIRCULATORY ARREST, RIGHT AXILLARY CANNULATION;  Surgeon: Gaye Pollack, MD;  Location: [place];  Service: Open Heart Surgery;  Laterality: N/A;   HPI:  64 yo Male with PMH of ESRD on HD, HTN, GERD, BPH, OSA who is transferred to [facility] tonight from [facility] ED for emergent cardiac cath. Pt remained intubated 8/25-8/28 post-procedure due to agitation.    Assessment / Plan / Recommendation Clinical Impression  Pt's voice is  hoarse and low in intensity, with significantly weak and ineffective throat clearing noted throughout trials of thin liquids and purees. Pt reports feeling very weak, and as if he cannot produce a stronger cough. Recommend to remain NPO today with reassessment by SLP on next date to determine readiness for POs versus need for FEES.    Aspiration Risk  Severe    Diet Recommendation NPO   Medication Administration: Via alternative means    Other  Recommendations Oral Care Recommendations: Oral care QID   Follow Up Recommendations   tba    Frequency and Duration min 3x week  1 week   Pertinent Vitals/Pain n/a    SLP Swallow Goals     Swallow Study Prior Functional Status       General Other Pertinent Information: 64 yo Male with PMH of ESRD on HD, HTN, GERD, BPH, OSA who is transferred to [facility] tonight from [facility] ED for emergent cardiac cath. Pt remained intubated 8/25-8/28 post-procedure due to agitation.  Type of Study: Bedside swallow evaluation Previous Swallow Assessment: none in chart Diet Prior to this Study: NPO Temperature Spikes Noted:  (99.3) Respiratory Status: Supplemental O2 delivered via (comment) ([place]) History of Recent Intubation: Yes Length of Intubations (days): 4 days Date extubated: 04/04/15 Behavior/Cognition: Alert;Cooperative;Confused;Requires cueing Oral Cavity - Dentition: Adequate natural dentition/normal for age Self-Feeding Abilities: Needs assist;Able to feed self Patient Positioning: Upright in chair/Tumbleform Baseline Vocal Quality: Hoarse;Low vocal intensity Volitional Cough: Weak    Oral/Motor/Sensory Function Overall Oral Motor/Sensory Function: Appears within functional limits for tasks assessed   Ice Chips Ice chips: Impaired Presentation: Spoon Pharyngeal Phase Impairments: Suspected delayed Swallow   Thin Liquid Thin Liquid: Impaired Presentation: Cup;Self Fed Pharyngeal  Phase Impairments: Suspected delayed Swallow;Multiple  swallows;Throat Clearing - Immediate    Nectar Thick Nectar Thick Liquid: Not tested   Honey Thick Honey Thick Liquid: Not tested   Puree Puree: Impaired Presentation: Spoon Pharyngeal Phase Impairments: Suspected delayed Swallow;Multiple swallows;Throat Clearing - Immediate   Solid   Solid: Not tested      Germain Osgood, M.A. CCC-SLP [phone]  Germain Osgood 04/05/2015,2:16 PM

## 2015-04-05 NOTE — Procedures (Signed)
I have seen and examined this patient and agree with the plan of care . No dialysis related related issues.  Matthew Kane W 04/05/2015, 1:42 PM

## 2015-04-06 NOTE — Progress Notes (Signed)
[facility] ROUNDING NOTE   Subjective:   Interval History: awake and alert    Failed swallow  No diarrhea  Still on nitro drip  Not able to take PO meds   Objective:  Vital signs in last 24 hours:  Temp:  [97.5 F (36.4 C)-98.5 F (36.9 C)] 98.5 F (36.9 C) (08/30 0800) Pulse Rate:  [95-109] 105 (08/30 0800) Resp:  [14-25] 18 (08/30 0800) BP: (118-164)/(67-96) 138/73 mmHg (08/30 0800) SpO2:  [91 %-99 %] 91 % (08/30 0800) Arterial Line BP: (129-154)/(64-76) 147/74 mmHg (08/29 1700) Weight:  [87.5 kg (192 lb 14.4 oz)-97.8 kg (215 lb 9.8 oz)] 87.5 kg (192 lb 14.4 oz) (08/30 0500)  Weight change: -0.2 kg (-7.1 oz) Filed Weights   04/05/15 0724 04/05/15 1126 04/06/15 0500  Weight: 91 kg (200 lb 9.9 oz) 97.8 kg (215 lb 9.8 oz) 87.5 kg (192 lb 14.4 oz)    Intake/Output: I/O last 3 completed shifts: In: 232.1 [I.V.:232.1] Out: 3000 [Other:3000]   Intake/Output this shift:     CVS- RRR RS- CTA bilateral ABD- BS present soft non-distended EXT- no edema   Basic Metabolic Panel:  Recent Labs Lab 04/01/15 1345  04/02/15 0440 04/02/15 1307 04/02/15 1923 04/02/15 1929 04/03/15 0400 04/04/15 0428 04/05/15 0300  NA  --   < > 133* 134*  --  135 137 138 140  K  --   < > 5.3* 5.5*  --  3.6 3.9 4.0 4.2  CL  --   < > 101 104  --  102 101 102 103  CO2  --   --  19* 15*  --   --  23 24 23   GLUCOSE  --   < > 116* 105*  --  93 79 78 103*  BUN  --   < > 43* 48*  --  25* 30* 27* 38*  CREATININE 9.10*  < > 10.38* 10.67* 6.72* 6.70* 7.73* 6.90* 9.26*  CALCIUM  --   --  7.7* 7.9*  --   --  8.0* 8.0* 8.8*  MG 1.9  --  1.8  --  1.7  --   --   --   --   PHOS  --   --   --  8.3*  --   --   --   --   --   < > = values in this interval not displayed.  Liver Function Tests:  Recent Labs Lab 04/02/15 1307  ALBUMIN 2.5*   No results for input(s): LIPASE, AMYLASE in the last 168 hours. No results for input(s): AMMONIA in the last 168 hours.  CBC:  Recent Labs Lab  04/02/15 1307 04/02/15 1923 04/02/15 1929 04/03/15 0400 04/04/15 0428 04/05/15 0300  WBC 8.8 8.1  --  7.4 7.3 11.6*  HGB 7.9* 8.3* 9.2* 8.3* 8.0* 8.2*  HCT 24.9* 25.3* 27.0* 25.4* 24.3* 25.0*  MCV 82.7 81.6  --  81.2 81.5 81.7  PLT 102* 86*  --  101* 76* 153    Cardiac Enzymes:  Recent Labs Lab 03/10/2015 1715  TROPONINI 0.15*    BNP: Invalid input(s): POCBNP  CBG:  Recent Labs Lab 04/04/15 1544 04/04/15 2019 04/05/15 0037 04/05/15 0354 04/05/15 1237  GLUCAP 79 103* 98 88 96    Microbiology: Results for orders placed or performed during the hospital encounter of 03/14/2015  MRSA PCR Screening     Status: None   Collection Time: 03/25/2015  8:33 PM  Result Value Ref Range Status  MRSA by PCR NEGATIVE NEGATIVE Final    Comment:        The GeneXpert MRSA Assay (FDA approved for NASAL specimens only), is one component of a comprehensive MRSA colonization surveillance program. It is not intended to diagnose MRSA infection nor to guide or monitor treatment for MRSA infections.   Surgical pcr screen     Status: None   Collection Time: 04/01/15  1:06 AM  Result Value Ref Range Status   MRSA, PCR NEGATIVE NEGATIVE Final   Staphylococcus aureus NEGATIVE NEGATIVE Final    Comment:        The Xpert SA Assay (FDA approved for NASAL specimens in patients over 54 years of age), is one component of a comprehensive surveillance program.  Test performance has been validated by [facility] for patients greater than or equal to 77 year old. It is not intended to diagnose infection nor to guide or monitor treatment.     Coagulation Studies: No results for input(s): LABPROT, INR in the last 72 hours.  Urinalysis: No results for input(s): COLORURINE, LABSPEC, PHURINE, GLUCOSEU, HGBUR, BILIRUBINUR, KETONESUR, PROTEINUR, UROBILINOGEN, NITRITE, LEUKOCYTESUR in the last 72 hours.  Invalid input(s): APPERANCEUR    Imaging: Dg Chest Port 1 View  04/05/2015    CLINICAL DATA:  Pneumothorax  EXAM: PORTABLE CHEST - 1 VIEW  COMPARISON:  April 04, 2015  FINDINGS: The heart size and mediastinal contours are stable. The heart size is enlarged. The aorta is tortuous. Dual lumen right central venous line is unchanged. Previously noted bilateral chest tubes have been removed. Left jugular vascular sheath is identified. There is no pleural line to suggest pneumothorax. There is probably a small right pleural effusion. Patchy consolidation of medial left lung base is unchanged. The visualized skeletal structures are unremarkable.  IMPRESSION: Interval removal of previously noted bilateral chest tubes. There is no pleural line to suggest pneumothorax.  There is probably a small right pleural effusion. Consolidation of left lung base is unchanged.   Electronically Signed   By: Abelardo Diesel M.D.   On: 04/05/2015 07:48     Medications:   . sodium chloride    . nitroGLYCERIN 25 mcg/min (04/06/15 0700)   . antiseptic oral rinse  7 mL Mouth Rinse QID  . aspirin EC  325 mg Oral Daily  . bisacodyl  10 mg Oral Daily   Or  . bisacodyl  10 mg Rectal Daily  . calcitRIOL  0.5 mcg Oral Daily  . calcium carbonate  800 mg of elemental calcium Oral BID WC  . chlorhexidine  15 mL Mouth/Throat BID  . cloNIDine  0.3 mg Transdermal Weekly  . docusate sodium  200 mg Oral Daily  . metoprolol tartrate  25 mg Oral BID  . multivitamin  1 tablet Oral QHS  . pantoprazole  40 mg Oral Daily  . 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

## 2015-04-06 NOTE — Care Management Important Message (Signed)
Important Message  Patient Details  Name: OTNIEL HOE MRN: [id number] Date of Birth: Mar 31, 1951   Medicare Important Message Given:  Yes-third notification given    Nathen May 04/06/2015, 2:08 [place] Message  Patient Details  Name: ERCELL RAZON MRN: [id number] Date of Birth: 27-Sep-1950   Medicare Important Message Given:  Yes-third notification given    Nathen May 04/06/2015, 2:08 PM

## 2015-04-06 NOTE — Evaluation (Addendum)
Occupational Therapy Evaluation Patient Details Name: Matthew Kane MRN: [id number] DOB: [birth year] Today's Date: 04/06/2015    History of Present Illness s/p ascending aortic repair (via sternotomy) 8/25; extubated 8/28 (periods of agitation when weaning attempted),  PMHx- ESRD with HD MWF; 02/19/15 repair LUE fistula/dialysis access with catheter placed Rt subclavian; bil THR   Clinical Impression   Pt s/p above. Unclear of pt's prior level of functioning. Pt orthostatic upon standing. Feel pt will benefit from acute OT to increase independence prior to d/c.     Follow Up Recommendations  SNF;Supervision/Assistance - 24 hour    Equipment Recommendations  Other (comment) (defer to next venue)    Recommendations for Other Services       Precautions / Restrictions Precautions Precautions: Sternal;Fall Precaution Comments: pt unable to state after instruction and requrired max cues to adhere Restrictions Weight Bearing Restrictions: No (sternal precautions)      Mobility Bed Mobility               General bed mobility comments: not assessed  Transfers Overall transfer level: Needs assistance Equipment used: Rolling walker (2 wheeled) Transfers: Sit to/from Stand Sit to Stand: Min assist;+2 physical assistance;+2 safety/equipment;From elevated surface         General transfer comment: max assist and cues to set up for transfer (scoot to edge of chair, placing feet appropiately and keeping feet in place, anterior wt-shift over BOS    Balance Overall balance assessment: Needs assistance         Standing balance support: Bilateral upper extremity supported Standing balance-Leahy Scale: Poor Standing balance comment: x60 seconds; leans forward as less alert                            ADL Overall ADL's : Needs assistance/impaired     Grooming: Wash/dry face;Set up;Supervision/safety;Sitting               Lower Body Dressing: Maximal  assistance;Sit to/from stand;Total assistance   Toilet Transfer: +2 for physical assistance; +2 for safety/equipment; Minimal assistance;RW (sit to stand from chair)             General ADL Comments: Pt lethargic in session.      Vision     Perception     Praxis      Pertinent Vitals/Pain Pain Assessment: 0-10 Pain Score: 8  Pain Location: chest Pain Descriptors / Indicators: Aching Pain Intervention(s): Monitored during session;Repositioned  SBP 157 to 135 sit to stand; MAP 105 to 88      Hand Dominance     Extremity/Trunk Assessment Upper Extremity Assessment Upper Extremity Assessment: RUE deficits/detail RUE Deficits / Details: edema in RUE   Lower Extremity Assessment Lower Extremity Assessment: Defer to PT evaluation   Cervical / Trunk Assessment Cervical / Trunk Assessment: Kyphotic   Communication Communication Communication: Other (comment) (hoarse, low volume)   Cognition Arousal/Alertness: Lethargic Behavior During Therapy: Flat affect Overall Cognitive Status: No family/caregiver present to determine baseline cognitive functioning       Memory: Decreased recall of precautions;Decreased short-term memory             General Comments       Exercises Exercises: General Lower Extremity Ankle circles/pumps: AAROM; Both (with heelcord stretch); with PT     Shoulder Instructions      Home Living Family/patient expects to be discharged to:: Private residence  Additional Comments: pt wants to stay with his sister; he is unclear on his place of residence PTA; per CM note, sister cannot care for pt as she cannot care for herself (has an Engineer, production)      Prior Functioning/Environment          Comments: unclear, states he was independent, then talks about having nurses help him bathe and dress and take him to HD    OT Diagnosis: Generalized weakness;Acute pain;Altered mental status   OT Problem  List: Decreased strength;Decreased activity tolerance;Impaired balance (sitting and/or standing);Decreased cognition;Decreased knowledge of use of DME or AE;Decreased knowledge of precautions;Pain;Increased edema   OT Treatment/Interventions: Self-care/ADL training;DME and/or AE instruction;Therapeutic activities;Cognitive remediation/compensation;Patient/family education;Balance training;Energy conservation    OT Goals(Current goals can be found in the care plan section) Acute Rehab OT Goals Patient Stated Goal: plans to go stay with sister in [place] OT Goal Formulation: With patient Time For Goal Achievement: May 11, 2015 Potential to Achieve Goals: Good ADL Goals Pt Will Perform Upper Body Bathing: with set-up;sitting Pt Will Perform Lower Body Bathing: sit to/from stand;with min assist Pt Will Perform Lower Body Dressing: with min assist;sit to/from stand Pt Will Transfer to Toilet: with supervision;ambulating Pt Will Perform Toileting - Clothing Manipulation and hygiene: with supervision;sit to/from stand  OT Frequency: Min 2X/week   Barriers to D/C:            Co-evaluation PT/OT/SLP Co-Evaluation/Treatment: Yes Reason for Co-Treatment: Complexity of the patient's impairments (multi-system involvement);For patient/therapist safety PT goals addressed during session: Mobility/safety with mobility;Balance;Proper use of DME OT goals addressed during session: ADL's and self-care;Other (comment) (mobility)      End of Session Equipment Utilized During Treatment: Gait belt;Rolling walker Nurse Communication:  (cognition)  Activity Tolerance: Patient limited by lethargy Patient left: in chair;with call bell/phone within reach;with chair alarm set   Time: 7564-3329 OT Time Calculation (min): 20 min Charges:  OT General Charges $OT Visit: 1 Procedure OT Evaluation $Initial OT Evaluation Tier I: 1 Procedure G-CodesBenito Mccreedy OTR/L [id number] 04/06/2015, 11:13 AM

## 2015-04-06 NOTE — Progress Notes (Signed)
6 Days Post-Op Procedure(s) (LRB): REPAIR OF ASCENDING AORTIC DISSECTION, HYPOTHERMIC CIRCULATORY ARREST, RIGHT AXILLARY CANNULATION (N/A) Subjective:  No complaints  Objective: Vital signs in last 24 hours: Temp:  [97.5 F (36.4 C)-98.5 F (36.9 C)] 98.5 F (36.9 C) (08/30 0800) Pulse Rate:  [97-109] 103 (08/30 0900) Cardiac Rhythm:  [-] Sinus tachycardia (08/30 0900) Resp:  [14-25] 23 (08/30 0900) BP: (119-164)/(67-96) 144/80 mmHg (08/30 0900) SpO2:  [91 %-98 %] 92 % (08/30 0900) Arterial Line BP: (129-154)/(64-76) 147/74 mmHg (08/29 1700) Weight:  [87.5 kg (192 lb 14.4 oz)-97.8 kg (215 lb 9.8 oz)] 87.5 kg (192 lb 14.4 oz) (08/30 0500)  Hemodynamic parameters for last 24 hours:    Intake/Output from previous day: 08/29 0701 - 08/30 0700 In: 126.1 [I.V.:126.1] Out: 3000  Intake/Output this shift: Total I/O In: 15 [I.V.:15] Out: -   General appearance: slowed mentation Neurologic: intact Heart: regular rate and rhythm, S1, S2 normal, no murmur, click, rub or gallop Lungs: clear to auscultation bilaterally Abdomen: soft, non-tender; bowel sounds normal; no masses,  no organomegaly Extremities: edema moderate in right upper extremity, mild elsewhere.  Wound: incisions ok  Lab Results:  Recent Labs  04/04/15 0428 04/05/15 0300  WBC 7.3 11.6*  HGB 8.0* 8.2*  HCT 24.3* 25.0*  PLT 76* 153   BMET:  Recent Labs  04/04/15 0428 04/05/15 0300  NA 138 140  K 4.0 4.2  CL 102 103  CO2 24 23  GLUCOSE 78 103*  BUN 27* 38*  CREATININE 6.90* 9.26*  CALCIUM 8.0* 8.8*    PT/INR: No results for input(s): LABPROT, INR in the last 72 hours. ABG    Component Value Date/Time   PHART 7.366 04/04/2015 1158   HCO3 22.8 04/04/2015 1158   TCO2 24 04/04/2015 1158   ACIDBASEDEF 2.0 04/04/2015 1158   O2SAT 99.0 04/04/2015 1158   CBG (last 3)   Recent Labs  04/05/15 0037 04/05/15 0354 04/05/15 1237  GLUCAP 98 88 96    Assessment/Plan: S/P Procedure(s)  (LRB): REPAIR OF ASCENDING AORTIC DISSECTION, HYPOTHERMIC CIRCULATORY ARREST, RIGHT AXILLARY CANNULATION (N/A)  He remains hemodynamically stable on NTG drip, clonidine patch and prn labetalol and hydralazine.  Will have to wait until he can pass swallow eval before starting po meds. His voice sounds stronger today.   ESRD: plan HD tomorrow.  Right arm edema: will get venous duplex to rule out DVT.  Continue PT, OT.   LOS: 6 days    Gaye Pollack 04/06/2015

## 2015-04-06 NOTE — Progress Notes (Signed)
Speech Language Pathology Treatment: Dysphagia  Patient Details Name: Matthew Kane MRN: [id number] DOB: Mar 17, 1951 Today's Date: 04/06/2015 Time: 3151-7616 SLP Time Calculation (min) (ACUTE ONLY): 21 min  Assessment / Plan / Recommendation Clinical Impression  Pt demonstrates fewer and less severe signs of aspiration than documented upon eval though immediate cough still observed x1 with thin liqudis and delayed cough observed with nectar. The pt also continues to have moderately hoarse vocal quality indicative of decreased airway protection and potential for decreased sensation of aspirate. Recommend pt consume pills in puree as this texture was tolerated well. Plan for trials tomorrow followed by FEES if signs of aspiration persist.    HPI Other Pertinent Information: 64 yo Male with PMH of ESRD on HD, HTN, GERD, BPH, OSA who is transferred to [place] from [facility] ED for emergent cardiac cath. Pt remained intubated 8/25-8/28 post-procedure due to agitation.    Pertinent Vitals Pain Assessment: Faces Faces Pain Scale: Hurts a little bit Pain Intervention(s): Repositioned  SLP Plan  Continue with current plan of care (FEES?)    Recommendations Diet recommendations: NPO Medication Administration: Whole meds with puree              Oral Care Recommendations: Oral care BID Follow up Recommendations: Skilled Nursing facility Plan: Continue with current plan of care (FEES?)    GO    Matthew [place], MA CCC-SLP [phone]  Matthew Kane, Matthew Kane 04/06/2015, 3:10 PM

## 2015-04-06 NOTE — Progress Notes (Signed)
[facility] BRIEF SICU PROGRESS NOTE  6 Days Post-Op  S/P Procedure(s) (LRB): REPAIR OF ASCENDING AORTIC DISSECTION, HYPOTHERMIC CIRCULATORY ARREST, RIGHT AXILLARY CANNULATION (N/A)   Stable day.  Mildly confused. In and out of Afib w/ controlled rate BP stable  Plan: Continue current plan  Rexene Alberts 04/06/2015 7:58 PM

## 2015-04-06 NOTE — Evaluation (Signed)
Physical Therapy Evaluation Patient Details Name: Matthew Kane MRN: [id number] DOB: May 22, 1951 Today's Date: 04/06/2015   History of Present Illness  s/p ascending aortic repair (via sternotomy) 8/25; extubated 8/28 (periods of agitation when weaning attempted),  PMHx- ESRD with HD MWF; 02/19/15 repair LUE fistula/dialysis access with catheter placed Rt subclavian; bil THR    Clinical Impression  Patient is s/p above surgery resulting in functional limitations due to the deficits listed below (see PT Problem List). Pt became lethargic and orthostatic upon standing with inability to walk. Prior functional status and living situation unclear based on attempted pt interview. Noted Case Manager note re: sister stating unable to provide care fot pt on d/c. Patient will benefit from skilled PT to increase their independence and safety with mobility to allow discharge to the venue listed below.       Follow Up Recommendations SNF;Supervision/Assistance - 24 hour    Equipment Recommendations  None recommended by PT    Recommendations for Other Services       Precautions / Restrictions Precautions Precautions: Sternal;Fall Precaution Comments: pt unable to state after instructiona nd requrired max cues to adhere Restrictions Weight Bearing Restrictions: No (sternal precautions)      Mobility  Bed Mobility                  Transfers Overall transfer level: Needs assistance Equipment used: Rolling walker (2 wheeled) Transfers: Sit to/from Stand Sit to Stand: Min assist;+2 physical assistance;+2 safety/equipment;From elevated surface         General transfer comment: max assist and cues to set up for transfer (scoot to EOC, placing feet appropiately and keeping feet in place, anterior wt-shift over BOS  Ambulation/Gait             General Gait Details: unable due to incr lethargy and orthostasis  Stairs            Wheelchair Mobility    Modified Rankin  (Stroke Patients Only)       Balance Overall balance assessment: Needs assistance         Standing balance support: Bilateral upper extremity supported Standing balance-Leahy Scale: Poor Standing balance comment: x 60 seconds; leans forward as less alert                             Pertinent Vitals/Pain SBP 157 to 135 sit to stand; MAP 105 to 88; +symptomatic  Pain Assessment: 0-10 Pain Score: 8  Pain Location: chest Pain Intervention(s): Limited activity within patient's tolerance;Monitored during session;Repositioned    Home Living Family/patient expects to be discharged to:: Skilled nursing facility                 Additional Comments: pt wants to stay with his sister; he is unclear on his place of residence PTA; per CM note, sister cannot care for pt as she cannot care for herself (has an Engineer, production)    Prior Function           Comments: unclear, states he was independent, then talks about having nurses help him bathe and dress and take him to HD     Hand Dominance        Extremity/Trunk Assessment   Upper Extremity Assessment: Defer to OT evaluation           Lower Extremity Assessment: Generalized weakness (assist to stand; bil DF 5/5)      Cervical / Trunk Assessment: Kyphotic  Communication   Communication: Other (comment) (hoarse, low volume)  Cognition Arousal/Alertness: Lethargic (per RN, no recent meds; likely 2/2 renal failure) Behavior During Therapy: Flat affect Overall Cognitive Status: No family/caregiver present to determine baseline cognitive functioning       Memory: Decreased recall of precautions;Decreased short-term memory              General Comments      Exercises General Exercises - Lower Extremity Ankle Circles/Pumps: AAROM;Both (with heelcord stretch)      Assessment/Plan    PT Assessment Patient needs continued PT services  PT Diagnosis Difficulty walking;Generalized weakness;Altered mental  status   PT Problem List Decreased strength;Decreased activity tolerance;Decreased balance;Decreased mobility;Decreased cognition;Decreased knowledge of use of DME;Decreased safety awareness;Decreased knowledge of precautions;Pain  PT Treatment Interventions DME instruction;Gait training;Functional mobility training;Therapeutic activities;Therapeutic exercise;Balance training;Cognitive remediation;Patient/family education   PT Goals (Current goals can be found in the Care Plan section) Acute Rehab PT Goals Patient Stated Goal: go stay with sister in [place] PT Goal Formulation: With patient Time For Goal Achievement: 04/20/15 Potential to Achieve Goals: Good    Frequency Min 3X/week   Barriers to discharge Decreased caregiver support      Co-evaluation PT/OT/SLP Co-Evaluation/Treatment: Yes Reason for Co-Treatment: Complexity of the patient's impairments (multi-system involvement);For patient/therapist safety PT goals addressed during session: Mobility/safety with mobility;Balance;Proper use of DME         End of Session Equipment Utilized During Treatment: Gait belt Activity Tolerance: Patient limited by lethargy;Treatment limited secondary to medical complications (Comment) Patient left: in chair;with call bell/phone within reach;with chair alarm set Nurse Communication: Mobility status         Time: 9449-6759 PT Time Calculation (min) (ACUTE ONLY): 24 min   Charges:   PT Evaluation $Initial PT Evaluation Tier I: 1 Procedure     PT G Codes:        Matthew Kane May 06, 2015, 10:45 AM  Pager [phone]

## 2015-04-06 NOTE — Care Management Note (Signed)
Case Management Note  Patient Details  Name: Matthew Kane MRN: [id number] Date of Birth: 07/09/1951  Subjective/Objective:    Patient lying in bed having HD.  States lives at home alone.  His plan for discharge is for him to go to his sisters house.  Gave me permission to talk with sister.  Talked with sister, [name].  States this info is wrong, he is unable to care for self at his house and she cannot have him at her house as she has a Oceanographer herself.  Set up meeting for 1pm tomorrow, 8-30 to discuss this more with brother and sister.                 Action/Plan:   Expected Discharge Date:                  Expected Discharge Plan:  Skilled Nursing Facility  In-House Referral:  Clinical Social Work  Discharge planning Services  CM Consult  Post Acute Care Choice:  NA Choice offered to:     DME Arranged:    DME Agency:     HH Arranged:    [place] Agency:     Status of Service:  In process, will continue to follow  Medicare Important Message Given:  Yes-second notification given Date Medicare IM Given:    Medicare IM give by:    Date Additional Medicare IM Given:    Additional Medicare Important Message give by:     If discussed at [place] of Stay Meetings, dates discussed:    Additional Comments:    Talked with sister, Matthew Kane and niece, Matthew Kane in room with patient.  Sister reinterates that she checked with her landlord and patient cannot stay with her.  Patient has an other sister and a brother that both live out of state.  Matthew Kane, niece says that patient can stay with her but has a lot of steps.  PT/OT recommending SNF for ST rehab.  Prior to admission patient drove himself to HD.  Plan for ST rehab agreeable to patient and family and then plan for their to be discharged to nieces.   SW consult has been placed.   Vergie Living, RN 04/06/2015, 2:02 PM

## 2015-04-07 ENCOUNTER — Ambulatory Visit (HOSPITAL_COMMUNITY): Payer: Medicare HMO

## 2015-04-07 LAB — CBC
HEMATOCRIT: 26.4 % — AB (ref 39.0–52.0)
HEMOGLOBIN: 8.4 g/dL — AB (ref 13.0–17.0)
MCH: 26.5 pg (ref 26.0–34.0)
MCHC: 31.8 g/dL (ref 30.0–36.0)
MCV: 83.3 fL (ref 78.0–100.0)
Platelets: 145 10*3/uL — ABNORMAL LOW (ref 150–400)
RBC: 3.17 MIL/uL — ABNORMAL LOW (ref 4.22–5.81)
RDW: 18.4 % — AB (ref 11.5–15.5)
WBC: 10.4 10*3/uL (ref 4.0–10.5)

## 2015-04-07 LAB — BASIC METABOLIC PANEL
ANION GAP: 12 (ref 5–15)
BUN: 40 mg/dL — AB (ref 6–20)
CHLORIDE: 102 mmol/L (ref 101–111)
CO2: 26 mmol/L (ref 22–32)
Calcium: 9.3 mg/dL (ref 8.9–10.3)
Creatinine, Ser: 9.4 mg/dL — ABNORMAL HIGH (ref 0.61–1.24)
GFR calc Af Amer: 6 mL/min — ABNORMAL LOW (ref >60)
GFR, EST NON AFRICAN AMERICAN: 5 mL/min — AB (ref >60)
GLUCOSE: 98 mg/dL (ref 65–99)
POTASSIUM: 4.3 mmol/L (ref 3.5–5.1)
Sodium: 140 mmol/L (ref 135–145)

## 2015-04-07 MED ORDER — SODIUM CHLORIDE 0.9 % IV SOLN
INTRAVENOUS | Status: DC
  Administered 2015-04-06: 23:00:00 via INTRAVENOUS

## 2015-04-07 MED ORDER — AMIODARONE IV BOLUS ONLY 150 MG/100ML
150.0000 mg | Freq: Once | INTRAVENOUS | Status: AC
  Administered 2015-04-07: 150 mg via INTRAVENOUS
  Filled 2015-04-07: qty 100

## 2015-04-07 MED ORDER — AMIODARONE HCL 200 MG PO TABS
400.0000 mg | ORAL_TABLET | Freq: Two times a day (BID) | ORAL | Status: DC
  Administered 2015-04-07 – 2015-04-10 (×7): 400 mg via ORAL
  Filled 2015-04-07 (×8): qty 2

## 2015-04-07 MED ORDER — AMLODIPINE BESYLATE 10 MG PO TABS
10.0000 mg | ORAL_TABLET | Freq: Every day | ORAL | Status: DC
  Administered 2015-04-07 – 2015-04-10 (×4): 10 mg via ORAL
  Filled 2015-04-07 (×4): qty 1

## 2015-04-07 MED ORDER — OXYCODONE HCL 5 MG PO TABS
5.0000 mg | ORAL_TABLET | ORAL | Status: DC | PRN
  Administered 2015-04-07 – 2015-04-10 (×5): 5 mg via ORAL
  Filled 2015-04-07 (×5): qty 1

## 2015-04-07 MED ORDER — METOPROLOL TARTRATE 50 MG PO TABS
50.0000 mg | ORAL_TABLET | Freq: Two times a day (BID) | ORAL | Status: DC
  Administered 2015-04-07: 50 mg via ORAL
  Filled 2015-04-07 (×3): qty 1

## 2015-04-07 NOTE — Progress Notes (Signed)
[facility] ROUNDING NOTE   Subjective:   Interval History: sitting in chair a little restless Objective:  Vital signs in last 24 hours:  Temp:  [97.8 F (36.6 C)-98.6 F (37 C)] 97.8 F (36.6 C) (08/31 0747) Pulse Rate:  [25-203] 82 (08/31 1000) Resp:  [13-29] 13 (08/31 1000) BP: (110-168)/(70-111) 142/87 mmHg (08/31 1000) SpO2:  [92 %-100 %] 95 % (08/31 1000) Weight:  [88.6 kg (195 lb 5.2 oz)] 88.6 kg (195 lb 5.2 oz) (08/31 0500)  Weight change: -2.4 kg (-5 lb 4.7 oz) Filed Weights   04/05/15 1126 04/06/15 0500 04/07/15 0500  Weight: 97.8 kg (215 lb 9.8 oz) 87.5 kg (192 lb 14.4 oz) 88.6 kg (195 lb 5.2 oz)    Intake/Output: I/O last 3 completed shifts: In: 328.3 [I.V.:328.3] Out: -    Intake/Output this shift:  Total I/O In: 105 [P.O.:60; I.V.:45] Out: -   CVS- RRR RS- CTA ABD- BS present soft non-distended EXT- no edema   Basic Metabolic Panel:  Recent Labs Lab 04/01/15 1345  04/02/15 0440 04/02/15 1307 04/02/15 1923 04/02/15 1929 04/03/15 0400 04/04/15 0428 04/05/15 0300 04/07/15 0047  NA  --   < > 133* 134*  --  135 137 138 140 140  K  --   < > 5.3* 5.5*  --  3.6 3.9 4.0 4.2 4.3  CL  --   < > 101 104  --  102 101 102 103 102  CO2  --   --  19* 15*  --   --  23 24 23 26   GLUCOSE  --   < > 116* 105*  --  93 79 78 103* 98  BUN  --   < > 43* 48*  --  25* 30* 27* 38* 40*  CREATININE 9.10*  < > 10.38* 10.67* 6.72* 6.70* 7.73* 6.90* 9.26* 9.40*  CALCIUM  --   --  7.7* 7.9*  --   --  8.0* 8.0* 8.8* 9.3  MG 1.9  --  1.8  --  1.7  --   --   --   --   --   PHOS  --   --   --  8.3*  --   --   --   --   --   --   < > = values in this interval not displayed.  Liver Function Tests:  Recent Labs Lab 04/02/15 1307  ALBUMIN 2.5*   No results for input(s): LIPASE, AMYLASE in the last 168 hours. No results for input(s): AMMONIA in the last 168 hours.  CBC:  Recent Labs Lab 04/02/15 1923 04/02/15 1929 04/03/15 0400 04/04/15 0428  04/05/15 0300 04/07/15 0047  WBC 8.1  --  7.4 7.3 11.6* 10.4  HGB 8.3* 9.2* 8.3* 8.0* 8.2* 8.4*  HCT 25.3* 27.0* 25.4* 24.3* 25.0* 26.4*  MCV 81.6  --  81.2 81.5 81.7 83.3  PLT 86*  --  101* 76* 153 145*    Cardiac Enzymes:  Recent Labs Lab 03/30/2015 1715  TROPONINI 0.15*    BNP: Invalid input(s): POCBNP  CBG:  Recent Labs Lab 04/04/15 1544 04/04/15 2019 04/05/15 0037 04/05/15 0354 04/05/15 1237  GLUCAP 79 103* 98 88 96    Microbiology: Results for orders placed or performed during the hospital encounter of 03/12/2015  MRSA PCR Screening     Status: None   Collection Time: 04/06/2015  8:33 PM  Result Value Ref Range Status   MRSA by PCR NEGATIVE NEGATIVE Final  Comment:        The GeneXpert MRSA Assay (FDA approved for NASAL specimens only), is one component of a comprehensive MRSA colonization surveillance program. It is not intended to diagnose MRSA infection nor to guide or monitor treatment for MRSA infections.   Surgical pcr screen     Status: None   Collection Time: 04/01/15  1:06 AM  Result Value Ref Range Status   MRSA, PCR NEGATIVE NEGATIVE Final   Staphylococcus aureus NEGATIVE NEGATIVE Final    Comment:        The Xpert SA Assay (FDA approved for NASAL specimens in patients over 52 years of age), is one component of a comprehensive surveillance program.  Test performance has been validated by [facility] for patients greater than or equal to 37 year old. It is not intended to diagnose infection nor to guide or monitor treatment.     Coagulation Studies: No results for input(s): LABPROT, INR in the last 72 hours.  Urinalysis: No results for input(s): COLORURINE, LABSPEC, PHURINE, GLUCOSEU, HGBUR, BILIRUBINUR, KETONESUR, PROTEINUR, UROBILINOGEN, NITRITE, LEUKOCYTESUR in the last 72 hours.  Invalid input(s): APPERANCEUR    Imaging: No results found.   Medications:   . sodium chloride    . sodium chloride 10 mL/hr at 04/06/15  2300   . amiodarone  400 mg Oral BID  . amLODipine  10 mg Oral Daily  . aspirin EC  325 mg Oral Daily  . bisacodyl  10 mg Oral Daily   Or  . bisacodyl  10 mg Rectal Daily  . calcitRIOL  0.5 mcg Oral Daily  . calcium carbonate  800 mg of elemental calcium Oral BID WC  . cloNIDine  0.3 mg Transdermal Weekly  . docusate sodium  200 mg Oral Daily  . metoprolol tartrate  50 mg Oral BID  . multivitamin  1 tablet Oral QHS  . pantoprazole  40 mg Oral Daily  . 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

## 2015-04-07 NOTE — Progress Notes (Signed)
Pt completed HD with no complications. Alert, bp high. Report given to primary RN.

## 2015-04-07 NOTE — Progress Notes (Signed)
Speech Language Pathology Treatment: Dysphagia  Patient Details Name: Matthew Kane MRN: [id number] DOB: August 17, 1950 Today's Date: 04/07/2015 Time: 4270-6237 SLP Time Calculation (min) (ACUTE ONLY): 22 min  Assessment / Plan / Recommendation Clinical Impression  Pt demonstrates subjective recovery of swallow function 3-4 days following extubation. Today no signs of aspiration observed with over 12 oz of water via straw in contrast with presentation yesterday and the day before. Pt also able to masticate solids without difficulty. Recommend pt initiate a regular diet with thin liquids. SLP will f/u x1 for tolerance.    HPI Other Pertinent Information: 64 yo Male with PMH of ESRD on HD, HTN, GERD, BPH, OSA who is transferred to [place] from [facility] ED for emergent cardiac cath. Pt remained intubated 8/25-8/28 post-procedure due to agitation.    Pertinent Vitals Pain Assessment: Faces Faces Pain Scale: No hurt  SLP Plan  Continue with current plan of care    Recommendations Diet recommendations: Regular;Thin liquid Liquids provided via: Cup;Straw Medication Administration: Whole meds with liquid Supervision: Patient able to self feed Postural Changes and/or Swallow Maneuvers: Seated upright 90 degrees              Oral Care Recommendations: Oral care BID Follow up Recommendations: None Plan: Continue with current plan of care    GO   [facility], MA CCC-SLP [phone]  [name] 04/07/2015, 8:38 AM

## 2015-04-07 NOTE — Progress Notes (Signed)
Physical Therapy Treatment Patient Details Name: Matthew Kane MRN: [id number] DOB: 23-Apr-1951 Today's Date: 04/07/2015    History of Present Illness s/p ascending aortic repair (via sternotomy) 8/25; extubated 8/28 (periods of agitation when weaning attempted),  PMHx- ESRD with HD MWF; 02/19/15 repair LUE fistula/dialysis access with catheter placed Rt subclavian; bil THR    PT Comments    Pt progressing towards physical therapy goals. Was able to complete a full set of orthostatics today and BP's were as follows: Supine: 164/86, Seated: 136/83, Standing: 110/80. Pt was symptomatic and required a seated rest break prior to transition to the chair. Mobility was not able to be progressed to ambulation at this time due to dizziness, however pt did well to take pivotal steps around from bed to chair. Will continue to follow and progress as able per POC.   Follow Up Recommendations  SNF;Supervision/Assistance - 24 hour     Equipment Recommendations  None recommended by PT    Recommendations for Other Services       Precautions / Restrictions Precautions Precautions: Sternal;Fall Precaution Comments: pt unable to state after instruction and requrired max cues to adhere Restrictions Weight Bearing Restrictions: Yes (Sternal)    Mobility  Bed Mobility Overal bed mobility: Needs Assistance;+2 for physical assistance Bed Mobility: Rolling;Sidelying to Sit Rolling: Mod assist Sidelying to sit: Mod assist;+2 for safety/equipment       General bed mobility comments: Pt was cued for sternal precautions and required assist to roll to the right, and then elevate trunk to full sitting position.   Transfers Overall transfer level: Needs assistance Equipment used: Rolling walker (2 wheeled) Transfers: Sit to/from Stand Sit to Stand: Min assist;+2 physical assistance;+2 safety/equipment;From elevated surface         General transfer comment: Max assist and cues to set up for transfer  (scoot to edge of bed, placing feet appropiately and keeping feet in place, anterior wt-shift over BOS). Pt was able to take pivotal steps around to the chair with bilateral elbow support for balance.   Ambulation/Gait             General Gait Details: Unable due to positive orthostatics.   Stairs            Wheelchair Mobility    Modified Rankin (Stroke Patients Only)       Balance Overall balance assessment: Needs assistance Sitting-balance support: Feet supported;No upper extremity supported Sitting balance-Leahy Scale: Fair     Standing balance support: Bilateral upper extremity supported Standing balance-Leahy Scale: Poor Standing balance comment: Increased sway in static standing and bialteral support provided at elbows for balance.                     Cognition Arousal/Alertness: Lethargic Behavior During Therapy: Flat affect Overall Cognitive Status: No family/caregiver present to determine baseline cognitive functioning       Memory: Decreased recall of precautions;Decreased short-term memory              Exercises      General Comments        Pertinent Vitals/Pain Pain Assessment: Faces Faces Pain Scale: Hurts a little bit Pain Location: chest Pain Intervention(s): Limited activity within patient's tolerance;Monitored during session;Repositioned    Home Living                      Prior Function            PT Goals (current goals can now be  found in the care plan section) Acute Rehab PT Goals PT Goal Formulation: With patient Time For Goal Achievement: 04/20/15 Potential to Achieve Goals: Good Progress towards PT goals: Progressing toward goals    Frequency  Min 3X/week    PT Plan Current plan remains appropriate    Co-evaluation             End of Session Equipment Utilized During Treatment: Gait belt Activity Tolerance: Treatment limited secondary to medical complications (Comment) (Positive  orthostatics) Patient left: in chair;with call bell/phone within reach;with chair alarm set     Time: 4496-7591 PT Time Calculation (min) (ACUTE ONLY): 25 min  Charges:  $Therapeutic Activity: 23-37 mins                    G Codes:      Rolinda Roan Apr 16, 2015, 9:50 AM   Rolinda Roan, PT, DPT Acute Rehabilitation Services Pager: [phone]

## 2015-04-07 NOTE — Progress Notes (Addendum)
TCTS DAILY ICU PROGRESS NOTE                   [address]            [place] [address]          [phone]   7 Days Post-Op Procedure(s) (LRB): REPAIR OF ASCENDING AORTIC DISSECTION, HYPOTHERMIC CIRCULATORY ARREST, RIGHT AXILLARY CANNULATION (N/A)  Total Length of Stay:  LOS: 7 days   Subjective: Awake and alert, pleasantly confused this am.  Thinks he is in [place]. No complaints.   Objective: Vital signs in last 24 hours: Temp:  [97.8 F (36.6 C)-98.6 F (37 C)] 97.8 F (36.6 C) (08/31 0747) Pulse Rate:  [25-203] 103 (08/31 0703) Cardiac Rhythm:  [-] Atrial fibrillation (08/31 0000) Resp:  [15-29] 16 (08/31 0703) BP: (111-168)/(69-111) 155/93 mmHg (08/31 0703) SpO2:  [91 %-100 %] 94 % (08/31 0703) Weight:  [195 lb 5.2 oz (88.6 kg)] 195 lb 5.2 oz (88.6 kg) (08/31 0500)  Filed Weights   04/05/15 1126 04/06/15 0500 04/07/15 0500  Weight: 215 lb 9.8 oz (97.8 kg) 192 lb 14.4 oz (87.5 kg) 195 lb 5.2 oz (88.6 kg)  PRE-OPERATIVE WEIGHT: 85 kg  Weight change: -5 lb 4.7 oz (-2.4 kg)   Hemodynamic parameters for last 24 hours:    Intake/Output from previous day: 08/30 0701 - 08/31 0700 In: 252.5 [I.V.:252.5] Out: -   Intake/Output this shift:    Current Meds: Scheduled Meds: . antiseptic oral rinse  7 mL Mouth Rinse QID  . aspirin EC  325 mg Oral Daily  . bisacodyl  10 mg Oral Daily   Or  . bisacodyl  10 mg Rectal Daily  . calcitRIOL  0.5 mcg Oral Daily  . calcium carbonate  800 mg of elemental calcium Oral BID WC  . chlorhexidine  15 mL Mouth/Throat BID  . cloNIDine  0.3 mg Transdermal Weekly  . docusate sodium  200 mg Oral Daily  . metoprolol tartrate  25 mg Oral BID  . multivitamin  1 tablet Oral QHS  . pantoprazole  40 mg Oral Daily  . sodium chloride  3 mL Intravenous Q12H   Continuous Infusions: . sodium chloride    . sodium chloride 10 mL/hr at 04/06/15 2300  . nitroGLYCERIN 25 mcg/min (04/06/15 2300)   PRN Meds:.sodium  chloride, sodium chloride, alteplase, calcium carbonate, feeding supplement (NEPRO CARB STEADY), heparin, hydrALAZINE, labetalol, lidocaine (PF), lidocaine-prilocaine, metoprolol, morphine injection, ondansetron (ZOFRAN) IV, pentafluoroprop-tetrafluoroeth, sodium chloride  Physical Exam: General appearance: alert and no distress Neurologic: Confused, oriented to person Heart: irregularly irregular rhythm Lungs: diminished breath sounds bibasilar Abdomen: soft, non-tender; bowel sounds normal; no masses,  no organomegaly Extremities: No significant LE edema  Wound: Clean and dry   Lab Results: CBC: Recent Labs  04/05/15 0300 04/07/15 0047  WBC 11.6* 10.4  HGB 8.2* 8.4*  HCT 25.0* 26.4*  PLT 153 145*   BMET:  Recent Labs  04/05/15 0300 04/07/15 0047  NA 140 140  K 4.2 4.3  CL 103 102  CO2 23 26  GLUCOSE 103* 98  BUN 38* 40*  CREATININE 9.26* 9.40*  CALCIUM 8.8* 9.3    PT/INR: No results for input(s): LABPROT, INR in the last 72 hours. Radiology: No results found.   Assessment/Plan: S/P Procedure(s) (LRB): REPAIR OF ASCENDING AORTIC DISSECTION, HYPOTHERMIC CIRCULATORY ARREST, RIGHT AXILLARY CANNULATION (N/A)  CV- remains in AF, rates currently in 90-100s. Hypertensive, remains on NTG gtt, clonidine patch, Lopressor, prn hydralazine.  ESRD- for HD this am, care per renal.  A/Ch anemia- H/H generally stable.  Nutrition- currently NPO except meds in puree after failing swallow study.   Speech path following for possible FEES.   Deconditioning- PT/OT.   COLLINS,GINA H 04/07/2015 8:13 AM   Chart reviewed, patient examined, agree with above. He is confused but otherwise doing well He passed his swallow eval so will transition to po meds Went into atrial fib with rate 90-110 so will start amio to try to get back to sinus. He would be a poor coumadin candidate.

## 2015-04-07 NOTE — Progress Notes (Signed)
Nutrition Follow Up  DOCUMENTATION CODES:   Not applicable  INTERVENTION:   Nepro Shake po BID, each supplement provides 425 kcal and 19 grams protein  NUTRITION DIAGNOSIS:   Increased nutrient needs related to chronic illness as evidenced by estimated needs, ongoing  GOAL:   Patient will meet greater than or equal to 90% of their needs, progressing  MONITOR:   PO intake, Supplement acceptance, Labs, Weight trends, I & O's  ASSESSMENT:   64 yo Male with PMH of ESRD on HD, HTN, GERD, BPH, OSA who is transferred to [facility] tonight from [facility] ED for emergent cardiac cath. He presented to the ED at [facility] today with c/o chest pain and nausea that started around 5pm. EKG with ST elevation in the precordial leads. Code STEMI called. At time of arrival to [facility], pt c/o ongoing chest pain. Mild SOB. He has been on ESRD since 2012. Cardiac cath in 2006 with minimal CAD. No cardiac issues since then. He has seen Dr. Lattie Haw in our [place] office in 2013 for pre-operative evaluation.  Patient s/p procedures 8/25: REPAIR OF ASCENDING AORTIC DISSECTION  HYPOTHERMIC CIRCULATORY ARREST RIGHT AXILLARY CANNULATION  Patient extubated 8/28.  Pt sleeping upon RD visit.  Did not wake.  S/p bedside swallow evaluation 8/29.  Diet advanced 8/31.  No % PO intake recorded in flowsheet records.  Nutrient needs increased with ESRD, hemodialysis.  RD to order nutrition supplements.  Diet Order:  Carbohydrate Modified   Skin:  Reviewed, no issues  Last BM:  8/28  Height:   Ht Readings from Last 1 Encounters:  04/01/15 6' (1.829 m)    Weight:   Wt Readings from Last 1 Encounters:  04/08/15 192 lb 0.3 oz (87.1 kg)    Ideal Body Weight:  81 kg  BMI:  Body mass index is 26.04 kg/(m^2).  Estimated Nutritional Needs:   Kcal:  2000-2200  Protein:  120-130 gm  Fluid:  per MD  EDUCATION NEEDS:   No education needs identified at this time  Arthur Holms, RD, LDN Pager #:  [phone] After-Hours Pager #: [phone]

## 2015-04-07 NOTE — Progress Notes (Signed)
Patient ID: Matthew Kane, male   DOB: 10/07/1950, 64 y.o.   MRN: [id number]  SICU Evening Rounds:  Hemodynamically stable  Still in A-fib in 90's on oral amio and lopressor.  Had HD today -3L  Eating some  Remains confused.

## 2015-04-07 NOTE — Progress Notes (Signed)
RT placed patient on CPAP with sterile water in humidifier. Patient is resting comfortably. RN is aware. RT will continue to monitor.

## 2015-04-08 ENCOUNTER — Ambulatory Visit (HOSPITAL_COMMUNITY): Payer: Medicare HMO

## 2015-04-08 DIAGNOSIS — R609 Edema, unspecified: Secondary | ICD-10-CM

## 2015-04-08 MED ORDER — DARBEPOETIN ALFA 100 MCG/0.5ML IJ SOSY
100.0000 ug | PREFILLED_SYRINGE | INTRAMUSCULAR | Status: AC
  Administered 2015-04-09: 100 ug via INTRAVENOUS
  Filled 2015-04-08: qty 0.5

## 2015-04-08 MED ORDER — NEPRO/CARBSTEADY PO LIQD
237.0000 mL | Freq: Two times a day (BID) | ORAL | Status: DC
  Administered 2015-04-10: 237 mL via ORAL
  Filled 2015-04-08 (×7): qty 237

## 2015-04-08 MED ORDER — SODIUM CHLORIDE 0.9 % IV SOLN
125.0000 mg | INTRAVENOUS | Status: DC
  Filled 2015-04-08 (×2): qty 10

## 2015-04-08 MED ORDER — METOPROLOL TARTRATE 50 MG PO TABS
75.0000 mg | ORAL_TABLET | Freq: Two times a day (BID) | ORAL | Status: DC
  Administered 2015-04-08 – 2015-04-10 (×5): 75 mg via ORAL
  Filled 2015-04-08 (×6): qty 1

## 2015-04-08 MED ORDER — HEPARIN (PORCINE) IN NACL 100-0.45 UNIT/ML-% IJ SOLN
1150.0000 [IU]/h | INTRAMUSCULAR | Status: DC
  Administered 2015-04-08: 1450 [IU]/h via INTRAVENOUS
  Administered 2015-04-09: 1600 [IU]/h via INTRAVENOUS
  Administered 2015-04-10: 1400 [IU]/h via INTRAVENOUS
  Administered 2015-04-11: 1000 [IU]/h via INTRAVENOUS
  Filled 2015-04-08 (×6): qty 250

## 2015-04-08 MED ORDER — HYDRALAZINE HCL 50 MG PO TABS
50.0000 mg | ORAL_TABLET | Freq: Three times a day (TID) | ORAL | Status: DC
  Administered 2015-04-08 – 2015-04-10 (×6): 50 mg via ORAL
  Filled 2015-04-08 (×10): qty 1

## 2015-04-08 NOTE — Progress Notes (Signed)
   04/08/15 1800  Clinical Encounter Type  Visited With Family;Health care provider  Visit Type Initial;Psychological support;Spiritual support;Post-op  Referral From Nurse  Consult/Referral To Chaplain  Recommendations (Please follow up with pt. He is confused and needs prayer.)  Spiritual Encounters  Spiritual Needs Prayer;Emotional  Stress Factors  Family Stress Factors Financial concerns;Lack of knowledge;Major life changes;Loss of control    Chaplain prayed with family. Pt. Was confused. Chaplain will follow up with family and Pt.  Dante Gang, Chaplain MDiv.  04/08/2015

## 2015-04-08 NOTE — Progress Notes (Signed)
VASCULAR LAB PRELIMINARY  PRELIMINARY  PRELIMINARY  PRELIMINARY  Right Upper Ext. Venous Duplex Completed. Positive  For acute deep vein thrombosis involving the right internal jugular, subclavian, and axillary veins. Results given to patient's nurse.  Alla German, RVT 04/08/2015, 11:07 AM

## 2015-04-08 NOTE — Progress Notes (Signed)
Speech Language Pathology Treatment: Dysphagia  Patient Details Name: Matthew Kane MRN: [id number] DOB: 1951/07/28 Today's Date: 04/08/2015 Time: 1031-5945 SLP Time Calculation (min) (ACUTE ONLY): 15 min  Assessment / Plan / Recommendation Clinical Impression  Pt seen with am meal, consistently demonstrated tolerance of thin liquids and regular solids, set up assist needed. Recommend pt continue current diet, no SLP f/u needed, will sign off.    HPI Other Pertinent Information: 64 yo Male with PMH of ESRD on HD, HTN, GERD, BPH, OSA who is transferred to [facility] from [facility] ED for emergent cardiac cath. Pt remained intubated 8/25-8/28 post-procedure due to agitation.    Pertinent Vitals Pain Assessment: Faces Faces Pain Scale: No hurt  SLP Plan  Continue with current plan of care    Recommendations Diet recommendations: Regular;Thin liquid Liquids provided via: Cup;Straw Medication Administration: Whole meds with liquid Supervision: Patient able to self feed Postural Changes and/or Swallow Maneuvers: Seated upright 90 degrees              Oral Care Recommendations: Oral care BID Follow up Recommendations: None Plan: Continue with current plan of care    GO    [facility], MA CCC-SLP [phone]  [name] 04/08/2015, 10:40 AM
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
## 2015-04-08 NOTE — Progress Notes (Signed)
TCTS BRIEF SICU PROGRESS NOTE  8 Days Post-Op  S/P Procedure(s) (LRB): REPAIR OF ASCENDING AORTIC DISSECTION, HYPOTHERMIC CIRCULATORY ARREST, RIGHT AXILLARY CANNULATION (N/A)   Stable day  Plan: Start IV heparin for RUE DVT  Rexene Alberts 04/08/2015 7:18 PM

## 2015-04-08 NOTE — Progress Notes (Signed)
8 Days Post-Op Procedure(s) (LRB): REPAIR OF ASCENDING AORTIC DISSECTION, HYPOTHERMIC CIRCULATORY ARREST, RIGHT AXILLARY CANNULATION (N/A) Subjective:  No complaints  Reportedly still confused and impulsive  Objective: Vital signs in last 24 hours: Temp:  [97.4 F (36.3 C)-98.9 F (37.2 C)] 98.6 F (37 C) (09/01 0431) Pulse Rate:  [70-112] 94 (09/01 0700) Cardiac Rhythm:  [-] Atrial fibrillation (09/01 0400) Resp:  [13-29] 13 (09/01 0700) BP: (110-182)/(68-116) 152/90 mmHg (09/01 0700) SpO2:  [92 %-100 %] 97 % (09/01 0700) Weight:  [87.1 kg (192 lb 0.3 oz)] 87.1 kg (192 lb 0.3 oz) (09/01 0615)  Hemodynamic parameters for last 24 hours:    Intake/Output from previous day: 08/31 0701 - 09/01 0700 In: 590 [P.O.:445; I.V.:145] Out: -  Intake/Output this shift:    General appearance: alert and cooperative Neurologic: intact Heart: irregular rate and rhythm, S1, S2 normal, no murmur, click, rub or gallop Lungs: clear to auscultation bilaterally Abdomen: soft, non-tender; bowel sounds normal; no masses,  no organomegaly Extremities: extremities normal, atraumatic, no cyanosis or edema Wound: incisions ok  Lab Results:  Recent Labs  04/07/15 0047  WBC 10.4  HGB 8.4*  HCT 26.4*  PLT 145*   BMET:  Recent Labs  04/07/15 0047  NA 140  K 4.3  CL 102  CO2 26  GLUCOSE 98  BUN 40*  CREATININE 9.40*  CALCIUM 9.3    PT/INR: No results for input(s): LABPROT, INR in the last 72 hours. ABG    Component Value Date/Time   PHART 7.366 04/04/2015 1158   HCO3 22.8 04/04/2015 1158   TCO2 24 04/04/2015 1158   ACIDBASEDEF 2.0 04/04/2015 1158   O2SAT 99.0 04/04/2015 1158   CBG (last 3)   Recent Labs  04/05/15 1237  GLUCAP 96    Assessment/Plan: S/P Procedure(s) (LRB): REPAIR OF ASCENDING AORTIC DISSECTION, HYPOTHERMIC CIRCULATORY ARREST, RIGHT AXILLARY CANNULATION (N/A)  He is hemodynamically stable but still tends to get hypertensive at times. Will increase  lopressor to 75 bid since still in A-fib with HR 90's. Start po hydralazine.  Postop A-fib with controlled rate. Continue amio and lopressor. He is not a good coumadin candidate and with renal failure not a good candidate for Xa inhibitor. Hopefully can get back to sinus rhythm.  ESRD: HD per nephrology.  Continue to encourage diet  Mobilize with PT.   LOS: 8 days    Gaye Pollack 04/08/2015

## 2015-04-08 NOTE — Progress Notes (Signed)
[facility] ROUNDING NOTE   Subjective:   Interval History: sitting comfortably in chair  Objective:  Vital signs in last 24 hours:  Temp:  [97.4 F (36.3 C)-98.9 F (37.2 C)] 98.4 F (36.9 C) (09/01 1309) Pulse Rate:  [80-112] 86 (09/01 1000) Resp:  [13-27] 21 (09/01 1000) BP: (130-182)/(68-116) 143/92 mmHg (09/01 1000) SpO2:  [94 %-100 %] 98 % (09/01 1000) Weight:  [87.1 kg (192 lb 0.3 oz)] 87.1 kg (192 lb 0.3 oz) (09/01 0615)  Weight change: -1.5 kg (-3 lb 4.9 oz) Filed Weights   04/06/15 0500 04/07/15 0500 04/08/15 0615  Weight: 87.5 kg (192 lb 14.4 oz) 88.6 kg (195 lb 5.2 oz) 87.1 kg (192 lb 0.3 oz)    Intake/Output: I/O last 3 completed shifts: In: 827.5 [P.O.:445; I.V.:382.5] Out: -    Intake/Output this shift:  Total I/O In: 150 [P.O.:150] Out: -   CVS- RRR  Healed scar RS- CTA ABD- BS present soft non-distended EXT- no edema   Basic Metabolic Panel:  Recent Labs Lab 04/01/15 1345  04/02/15 0440 04/02/15 1307 04/02/15 1923 04/02/15 1929 04/03/15 0400 04/04/15 0428 04/05/15 0300 04/07/15 0047  NA  --   < > 133* 134*  --  135 137 138 140 140  K  --   < > 5.3* 5.5*  --  3.6 3.9 4.0 4.2 4.3  CL  --   < > 101 104  --  102 101 102 103 102  CO2  --   < > 19* 15*  --   --  23 24 23 26   GLUCOSE  --   < > 116* 105*  --  93 79 78 103* 98  BUN  --   < > 43* 48*  --  25* 30* 27* 38* 40*  CREATININE 9.10*  < > 10.38* 10.67* 6.72* 6.70* 7.73* 6.90* 9.26* 9.40*  CALCIUM  --   < > 7.7* 7.9*  --   --  8.0* 8.0* 8.8* 9.3  MG 1.9  --  1.8  --  1.7  --   --   --   --   --   PHOS  --   --   --  8.3*  --   --   --   --   --   --   < > = values in this interval not displayed.  Liver Function Tests:  Recent Labs Lab 04/02/15 1307  ALBUMIN 2.5*   No results for input(s): LIPASE, AMYLASE in the last 168 hours. No results for input(s): AMMONIA in the last 168 hours.  CBC:  Recent Labs Lab 04/02/15 1923 04/02/15 1929 04/03/15 0400  04/04/15 0428 04/05/15 0300 04/07/15 0047  WBC 8.1  --  7.4 7.3 11.6* 10.4  HGB 8.3* 9.2* 8.3* 8.0* 8.2* 8.4*  HCT 25.3* 27.0* 25.4* 24.3* 25.0* 26.4*  MCV 81.6  --  81.2 81.5 81.7 83.3  PLT 86*  --  101* 76* 153 145*    Cardiac Enzymes: No results for input(s): CKTOTAL, CKMB, CKMBINDEX, TROPONINI in the last 168 hours.  BNP: Invalid input(s): POCBNP  CBG:  Recent Labs Lab 04/04/15 1544 04/04/15 2019 04/05/15 0037 04/05/15 0354 04/05/15 1237  GLUCAP 79 103* 98 88 96    Microbiology: Results for orders placed or performed during the hospital encounter of 04/05/2015  MRSA PCR Screening     Status: None   Collection Time: 03/25/2015  8:33 PM  Result Value Ref Range Status   MRSA by PCR  NEGATIVE NEGATIVE Final    Comment:        The GeneXpert MRSA Assay (FDA approved for NASAL specimens only), is one component of a comprehensive MRSA colonization surveillance program. It is not intended to diagnose MRSA infection nor to guide or monitor treatment for MRSA infections.   Surgical pcr screen     Status: None   Collection Time: 04/01/15  1:06 AM  Result Value Ref Range Status   MRSA, PCR NEGATIVE NEGATIVE Final   Staphylococcus aureus NEGATIVE NEGATIVE Final    Comment:        The Xpert SA Assay (FDA approved for NASAL specimens in patients over 13 years of age), is one component of a comprehensive surveillance program.  Test performance has been validated by [facility] for patients greater than or equal to 55 year old. It is not intended to diagnose infection nor to guide or monitor treatment.     Coagulation Studies: No results for input(s): LABPROT, INR in the last 72 hours.  Urinalysis: No results for input(s): COLORURINE, LABSPEC, PHURINE, GLUCOSEU, HGBUR, BILIRUBINUR, KETONESUR, PROTEINUR, UROBILINOGEN, NITRITE, LEUKOCYTESUR in the last 72 hours.  Invalid input(s): APPERANCEUR    Imaging: No results found.   Medications:   . sodium chloride     . sodium chloride Stopped (04/07/15 1000)   . amiodarone  400 mg Oral BID  . amLODipine  10 mg Oral Daily  . aspirin EC  325 mg Oral Daily  . bisacodyl  10 mg Oral Daily   Or  . bisacodyl  10 mg Rectal Daily  . calcitRIOL  0.5 mcg Oral Daily  . calcium carbonate  800 mg of elemental calcium Oral BID WC  . cloNIDine  0.3 mg Transdermal Weekly  . docusate sodium  200 mg Oral Daily  . hydrALAZINE  50 mg Oral 3 times per day  . metoprolol tartrate  75 mg Oral BID  . multivitamin  1 tablet Oral QHS  . pantoprazole  40 mg Oral Daily  . 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

## 2015-04-08 NOTE — Clinical Social Work Note (Signed)
CSW received consult for patient needing SNF for short term rehab, assessment to follow.  Jones Broom. [place], MSW, [place] 04/08/2015 11:14 AM

## 2015-04-08 NOTE — Progress Notes (Addendum)
Patient ID: Matthew Kane, male   DOB: May 28, 1951, 64 y.o.   MRN: [id number]  Venous duplex of the right upper extremity shows DVT of the IJ, subclavian and axillary veins. He has an IJ HD tunneled catheter. Will start heparin and discuss with vascular surgery. He may need the catheter removed. Hold off on coumadin until decision made about catheter.   Addendum: I discussed with Dr. Bridgett Larsson and he feels we should leave the catheter in place since we are going to treat the DVT with anticoagulation. Will plan to start coumadin tomorrow if therapeutic on heparin.

## 2015-04-08 NOTE — Progress Notes (Signed)
Patient became very combative and attempted to get out of bed. Family at bedside. Nurses placed patient back in bed and bilateral wrist restraints were applied. Security was informed and came to room. MD notified. Obtained order for wrist restraints. Nurse will continue to monitor.

## 2015-04-08 NOTE — Progress Notes (Signed)
ANTICOAGULATION CONSULT NOTE - Initial Consult  Pharmacy Consult for Heparin Indication: VTE treatment  Allergies  Allergen Reactions  . Penicillins Itching and Rash    Patient Measurements: Height: 6' (182.9 cm) Weight: 192 lb 0.3 oz (87.1 kg) IBW/kg (Calculated) : 77.6  Vital Signs: Temp: 98.6 F (37 C) (09/01 1534) Temp Source: Oral (09/01 1534) BP: 174/140 mmHg (09/01 1400) Pulse Rate: 68 (09/01 1400)  Labs:  Recent Labs  04/07/15 0047  HGB 8.4*  HCT 26.4*  PLT 145*  CREATININE 9.40*    Estimated Creatinine Clearance: 8.8 mL/min (by C-G formula based on Cr of 9.4).   Medical History: Past Medical History  Diagnosis Date  . Hypertension     Echo in 10/2006-moderate LVH, normal EF  . Obesity   . Gout   . Gastroesophageal reflux disease   . Erectile dysfunction   . Prostatic hypertrophy, benign   . Allergic rhinitis   . Chronic kidney disease, stage 5, kidney failure      dialysis at [facility] T/TH/Sat; ESRD as of 05/2011; leg cramps; hypokalemia; microcytosis  . Secondary hyperparathyroidism of renal origin   . Anemia   . Dialysis patient   . History of blood transfusion   . Thrombocytopenia   . Elevated troponin   . Obstructive sleep apnea 2008    2008- uses CPAP  . Difficult intubation     Spasms after LMA inserted -12/13/10    Assessment: 64 yo M presents on 8/24 with complaints of chest pain and nausea. EKG changes were noted and code STEMI called. Now s/p OR for acute type A aortic dissection on 8/25. Venous duplex done and showed a R upper extremity DVT of the IJ, subclavian, and axillary veins. Pharmacy consulted to start heparin. Has not been receiving any heparin or enoxaparin for DVT prophylaxis. Hgb seems stable at 8.4, plts 145. No s/s of bleed.  Goal of Therapy:  Heparin level 0.3-0.7 units/ml Monitor platelets by anticoagulation protocol: Yes   Plan:  No heparin bolus Start heparin gtt at 1450 units/hr Check 8 hr HL Monitor daily  HL, CBC, s/s of bleed  Romell Cavanah J 04/08/2015,3:43 PM

## 2015-04-08 DEATH — deceased

## 2015-04-09 LAB — RENAL FUNCTION PANEL
ANION GAP: 16 — AB (ref 5–15)
Albumin: 2.5 g/dL — ABNORMAL LOW (ref 3.5–5.0)
BUN: 54 mg/dL — ABNORMAL HIGH (ref 6–20)
CALCIUM: 8.8 mg/dL — AB (ref 8.9–10.3)
CHLORIDE: 98 mmol/L — AB (ref 101–111)
CO2: 21 mmol/L — AB (ref 22–32)
Creatinine, Ser: 10.51 mg/dL — ABNORMAL HIGH (ref 0.61–1.24)
GFR calc Af Amer: 5 mL/min — ABNORMAL LOW (ref >60)
GFR calc non Af Amer: 5 mL/min — ABNORMAL LOW (ref >60)
GLUCOSE: 77 mg/dL (ref 65–99)
Phosphorus: 6.5 mg/dL — ABNORMAL HIGH (ref 2.5–4.6)
Potassium: 4.4 mmol/L (ref 3.5–5.1)
SODIUM: 135 mmol/L (ref 135–145)

## 2015-04-09 LAB — HEPARIN LEVEL (UNFRACTIONATED)
HEPARIN UNFRACTIONATED: 0.23 [IU]/mL — AB (ref 0.30–0.70)
HEPARIN UNFRACTIONATED: 0.63 [IU]/mL (ref 0.30–0.70)

## 2015-04-09 LAB — CBC
HCT: 30 % — ABNORMAL LOW (ref 39.0–52.0)
HEMOGLOBIN: 9.6 g/dL — AB (ref 13.0–17.0)
MCH: 26.1 pg (ref 26.0–34.0)
MCHC: 32 g/dL (ref 30.0–36.0)
MCV: 81.5 fL (ref 78.0–100.0)
Platelets: 163 10*3/uL (ref 150–400)
RBC: 3.68 MIL/uL — ABNORMAL LOW (ref 4.22–5.81)
RDW: 17.8 % — AB (ref 11.5–15.5)
WBC: 14.6 10*3/uL — ABNORMAL HIGH (ref 4.0–10.5)

## 2015-04-09 MED ORDER — HEPARIN SODIUM (PORCINE) 1000 UNIT/ML DIALYSIS: 1000.0000 [IU] | INTRAMUSCULAR | Status: DC | PRN

## 2015-04-09 MED ORDER — HALOPERIDOL LACTATE 5 MG/ML IJ SOLN
5.0000 mg | INTRAMUSCULAR | Status: DC | PRN
  Administered 2015-04-09: 5 mg via INTRAVENOUS
  Filled 2015-04-09: qty 1

## 2015-04-09 MED ORDER — LIDOCAINE-PRILOCAINE 2.5-2.5 % EX CREA: 1.0000 "application " | TOPICAL_CREAM | CUTANEOUS | Status: DC | PRN

## 2015-04-09 MED ORDER — LIDOCAINE HCL (PF) 1 % IJ SOLN: 5.0000 mL | INTRAMUSCULAR | Status: DC | PRN

## 2015-04-09 MED ORDER — SODIUM CHLORIDE 0.9 % IV SOLN: 100.0000 mL | INTRAVENOUS | Status: DC | PRN

## 2015-04-09 MED ORDER — LIDOCAINE-PRILOCAINE 2.5-2.5 % EX CREA
1.0000 | TOPICAL_CREAM | CUTANEOUS | Status: DC | PRN
Start: 2015-04-09 — End: 2015-04-09

## 2015-04-09 MED ORDER — ALTEPLASE 2 MG IJ SOLR: 2.0000 mg | Freq: Once | INTRAMUSCULAR | Status: DC | PRN

## 2015-04-09 MED ORDER — DARBEPOETIN ALFA 100 MCG/0.5ML IJ SOSY
PREFILLED_SYRINGE | INTRAMUSCULAR | Status: AC
  Filled 2015-04-09: qty 0.5

## 2015-04-09 MED ORDER — WARFARIN SODIUM 5 MG PO TABS
5.0000 mg | ORAL_TABLET | Freq: Once | ORAL | Status: AC
  Administered 2015-04-09: 5 mg via ORAL
  Filled 2015-04-09: qty 1

## 2015-04-09 MED ORDER — PENTAFLUOROPROP-TETRAFLUOROETH EX AERO: 1.0000 "application " | INHALATION_SPRAY | CUTANEOUS | Status: DC | PRN

## 2015-04-09 MED ORDER — WARFARIN - PHARMACIST DOSING INPATIENT: Freq: Every day | Status: DC

## 2015-04-09 MED ORDER — NEPRO/CARBSTEADY PO LIQD
237.0000 mL | ORAL | Status: DC | PRN
  Filled 2015-04-09: qty 237

## 2015-04-09 MED ORDER — ALTEPLASE 2 MG IJ SOLR
2.0000 mg | Freq: Once | INTRAMUSCULAR | Status: DC | PRN
Start: 2015-04-09 — End: 2015-04-09
  Filled 2015-04-09: qty 2

## 2015-04-09 MED ORDER — ALTEPLASE 2 MG IJ SOLR
2.0000 mg | Freq: Once | INTRAMUSCULAR | Status: DC | PRN
Start: 2015-04-09 — End: 2015-04-09

## 2015-04-09 NOTE — Progress Notes (Signed)
Patient did not tolerate CPAP and pulled it off. RT talked to patient about putting it back on but patient refused. RT will continue to monitor.

## 2015-04-09 NOTE — Progress Notes (Signed)
Patient extremely restless and agitated in bed.  In 2 point wrist restraints patient was able to use his mouth/teeth to remove his peripheral IV in his right hand.  Dr. Cyndia Bent called about lack of access.  He approved infusion of heparin through HD cath site and Dr. Justin Mend agreed to plan.  Heparin will have to be stopped during HD treatment this afternoon and then restarted afterwards.  MD aware.  Matthew Kane

## 2015-04-09 NOTE — Clinical Social Work Note (Signed)
CSW received consult for patient to go to SNF for short term rehab, CSW did not have a chance to complete assessment.  Jones Broom. [place], MSW, [place] 04/09/2015 6:49 PM
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
## 2015-04-09 NOTE — Progress Notes (Signed)
ANTICOAGULATION CONSULT NOTE - Follow Up Consult  Pharmacy Consult for Heparin  Indication: DVT  Allergies  Allergen Reactions  . Penicillins Itching and Rash   Patient Measurements: Height: 6' (182.9 cm) Weight: 192 lb 0.3 oz (87.1 kg) IBW/kg (Calculated) : 77.6  Vital Signs: Temp: 97.4 F (36.3 C) (09/01 2356) Temp Source: Oral (09/01 2356) BP: 164/67 mmHg (09/02 0100) Pulse Rate: 79 (09/02 0100)  Labs:  Recent Labs  04/07/15 0047 04/09/15 0151  HGB 8.4*  --   HCT 26.4*  --   PLT 145*  --   HEPARINUNFRC  --  0.23*  CREATININE 9.40*  --    Estimated Creatinine Clearance: 8.8 mL/min (by C-G formula based on Cr of 9.4).  Assessment: Heparin for RUE DVT, pt is s/p repair of ascending aortic dissection, heparin level is sub-therapeutic this AM, no issues per RN.   Goal of Therapy:  Heparin level 0.3-0.7 units/ml Monitor platelets by anticoagulation protocol: Yes   Plan:  -Increase heparin to 1600 units/hr -1130 HL -Daily CBC/HL -Monitor for bleeding  Narda Bonds 04/09/2015,3:01 AM

## 2015-04-09 NOTE — Progress Notes (Addendum)
OT Cancellation Note  Patient Details Name: Matthew Kane MRN: [id number] DOB: [birth year]   Cancelled Treatment:    Reason Eval/Treat Not Completed: Other (comment). Spoke with nursing who states pt has been rather combative today and should have Hemodialysis sometime today--not really a good day to see him for OT per her take on the situation. Will re-attempt tx next week.  Almon Register [phone] 04/09/2015, 1:11 PM

## 2015-04-09 NOTE — Progress Notes (Signed)
RT placed patient on CPAP and patient is resting comfortably.

## 2015-04-09 NOTE — Progress Notes (Signed)
Patient does not tolerate CPAP, pulling at mask.  Patient is currently restrained and 98% on room air.  RN aware.

## 2015-04-09 NOTE — Progress Notes (Addendum)
ANTICOAGULATION CONSULT NOTE - Follow Up Consult  Pharmacy Consult for heparin and warfarin Indication: RUE DVT  Allergies  Allergen Reactions  . Penicillins Itching and Rash   Patient Measurements: Height: 6' (182.9 cm) Weight: 192 lb 0.3 oz (87.1 kg) IBW/kg (Calculated) : 77.6  Vital Signs: Temp: 97.7 F (36.5 C) (09/02 1100) Temp Source: Oral (09/02 0346) BP: 169/75 mmHg (09/02 1300) Pulse Rate: 71 (09/02 1300)  Labs:  Recent Labs  04/07/15 0047 04/09/15 0151 04/09/15 1004  HGB 8.4*  --  9.6*  HCT 26.4*  --  30.0*  PLT 145*  --  163  HEPARINUNFRC  --  0.23* 0.63  CREATININE 9.40*  --   --    Estimated Creatinine Clearance: 8.8 mL/min (by C-G formula based on Cr of 9.4).  Medication: Infusion: . sodium chloride    . sodium chloride Stopped (04/07/15 1000)  . heparin 1,600 Units/hr (04/09/15 1200)    Assessment: 64 y/o male s/p repair of ascending aortic dissection on 8/25 on a heparin drip for RUE DVT. Pharmacy consulted to begin warfarin also. Patient has been confused and combative today and has removed his IV twice. Heparin has been infusing through HD cath since ~12p however patient will be going for HD soon and heparin will be turned off until HD complete. Will have to reschedule heparin level once resumed post HD. INR was 1.52 on 8/25. No bleeding noted, CBC is stable. Amiodarone is a new medication for him this admit so will dose conservatively and watch for interaction.  Goal of Therapy:  INR 2-3 Heparin level 0.3-0.7 units/ml Monitor platelets by anticoagulation protocol: Yes   Plan:  -Heparin drip at 1600 units/hr when access available -Warfarin 5 mg PO tonight -8 hr heparin level once resumed -Daily heparin level, CBC, and INR -Monitor for s/sx bleeding  [facility], Pharm.D., BCPS Clinical Pharmacist Pager: [phone] 04/09/2015 1:41 PM

## 2015-04-09 NOTE — Progress Notes (Signed)
PT Cancellation Note  Patient Details Name: Matthew Kane MRN: [id number] DOB: [birth year]   Cancelled Treatment:    Reason Eval/Treat Not Completed: Medical issues which prohibited therapy. Noted pt has been very confused and combative overnight and into a.m. Pt currently resting quietly and RN requested PT defer activity at this time.   Dennette Faulconer 04/09/2015, 2:16 PM Pager [phone]

## 2015-04-09 NOTE — Procedures (Signed)
I have seen and examined this patient and agree with the plan of care .  Lungs clear  And no edema  Hb 9.6  K 4.3  Harper Vandervoort W 04/09/2015, 2:32 PM

## 2015-04-10 LAB — CBC
HCT: 27.5 % — ABNORMAL LOW (ref 39.0–52.0)
Hemoglobin: 8.9 g/dL — ABNORMAL LOW (ref 13.0–17.0)
MCH: 26.4 pg (ref 26.0–34.0)
MCHC: 32.4 g/dL (ref 30.0–36.0)
MCV: 81.6 fL (ref 78.0–100.0)
PLATELETS: 150 10*3/uL (ref 150–400)
RBC: 3.37 MIL/uL — ABNORMAL LOW (ref 4.22–5.81)
RDW: 17.9 % — AB (ref 11.5–15.5)
WBC: 13.6 10*3/uL — ABNORMAL HIGH (ref 4.0–10.5)

## 2015-04-10 LAB — HEPARIN LEVEL (UNFRACTIONATED)
HEPARIN UNFRACTIONATED: 0.82 [IU]/mL — AB (ref 0.30–0.70)
Heparin Unfractionated: 0.81 IU/mL — ABNORMAL HIGH (ref 0.30–0.70)
Heparin Unfractionated: 0.92 IU/mL — ABNORMAL HIGH (ref 0.30–0.70)

## 2015-04-10 LAB — PROTIME-INR
INR: 1.37 (ref 0.00–1.49)
Prothrombin Time: 17 seconds — ABNORMAL HIGH (ref 11.6–15.2)

## 2015-04-10 MED ORDER — WARFARIN SODIUM 5 MG PO TABS
5.0000 mg | ORAL_TABLET | Freq: Once | ORAL | Status: AC
  Administered 2015-04-10: 5 mg via ORAL
  Filled 2015-04-10: qty 1

## 2015-04-10 MED ORDER — DARBEPOETIN ALFA 100 MCG/0.5ML IJ SOSY
100.0000 ug | PREFILLED_SYRINGE | INTRAMUSCULAR | Status: DC
  Filled 2015-04-10: qty 0.5

## 2015-04-10 MED ORDER — ACETAMINOPHEN 325 MG PO TABS
650.0000 mg | ORAL_TABLET | Freq: Four times a day (QID) | ORAL | Status: DC | PRN
  Administered 2015-04-10 (×2): 650 mg via ORAL
  Filled 2015-04-10 (×2): qty 2

## 2015-04-10 NOTE — Progress Notes (Signed)
10 Days Post-Op Procedure(s) (LRB): REPAIR OF ASCENDING AORTIC DISSECTION, HYPOTHERMIC CIRCULATORY ARREST, RIGHT AXILLARY CANNULATION (N/A) Subjective: C/o incisional pain Less confused, agitated today   Objective: Vital signs in last 24 hours: Temp:  [97 F (36.1 C)-98.9 F (37.2 C)] 98.4 F (36.9 C) (09/03 0839) Pulse Rate:  [60-89] 81 (09/03 0600) Cardiac Rhythm:  [-] Atrial fibrillation (09/03 0400) Resp:  [15-27] 25 (09/03 0600) BP: (90-180)/(40-93) 132/56 mmHg (09/03 0633) SpO2:  [93 %-98 %] 94 % (09/03 0600) Weight:  [179 lb 7.3 oz (81.4 kg)-189 lb 6 oz (85.9 kg)] 179 lb 7.3 oz (81.4 kg) (09/03 0500)  Hemodynamic parameters for last 24 hours:    Intake/Output from previous day: 09/02 0701 - 09/03 0700 In: 686.3 [P.O.:420; I.V.:266.3] Out: 2200  Intake/Output this shift:    General appearance: alert and cooperative Neurologic: intact Heart: regular rate and rhythm Lungs: clear to auscultation bilaterally  Lab Results:  Recent Labs  04/09/15 1004 04/10/15 0240  WBC 14.6* 13.6*  HGB 9.6* 8.9*  HCT 30.0* 27.5*  PLT 163 150   BMET:  Recent Labs  04/09/15 1523  NA 135  K 4.4  CL 98*  CO2 21*  GLUCOSE 77  BUN 54*  CREATININE 10.51*  CALCIUM 8.8*    PT/INR:  Recent Labs  04/10/15 0230  LABPROT 17.0*  INR 1.37   ABG    Component Value Date/Time   PHART 7.366 04/04/2015 1158   HCO3 22.8 04/04/2015 1158   TCO2 24 04/04/2015 1158   ACIDBASEDEF 2.0 04/04/2015 1158   O2SAT 99.0 04/04/2015 1158   CBG (last 3)  No results for input(s): GLUCAP in the last 72 hours.  Assessment/Plan: S/P Procedure(s) (LRB): REPAIR OF ASCENDING AORTIC DISSECTION, HYPOTHERMIC CIRCULATORY ARREST, RIGHT AXILLARY CANNULATION (N/A) -  CV- stable  Still on heparin, coumadin started  RESP- stable  RENAL- HD yesterday, plan per renal  Anemia- Hct down slightly, follow  Mobilize as tolerated   LOS: 10 days    Melrose Nakayama 04/10/2015

## 2015-04-10 NOTE — Progress Notes (Signed)
[place] for heparin and warfarin Indication: RUE DVT  Allergies  Allergen Reactions  . Penicillins Itching and Rash   Patient Measurements: Height: 6' (182.9 cm) Weight: 189 lb 6 oz (85.9 kg) IBW/kg (Calculated) : 77.6  Vital Signs: Temp: 97.9 F (36.6 C) (09/03 0000) Temp Source: Oral (09/03 0000) BP: 150/77 mmHg (09/03 0200) Pulse Rate: 79 (09/03 0200)  Labs:  Recent Labs  04/09/15 0151 04/09/15 1004 04/09/15 1523 04/10/15 0230 04/10/15 0240  HGB  --  9.6*  --   --  8.9*  HCT  --  30.0*  --   --  27.5*  PLT  --  163  --   --  150  LABPROT  --   --   --  17.0*  --   INR  --   --   --  1.37  --   HEPARINUNFRC 0.23* 0.63  --  0.82*  --   CREATININE  --   --  10.51*  --   --    Estimated Creatinine Clearance: 7.9 mL/min (by C-G formula based on Cr of 10.51).   Assessment: 64 y/o male with RUE DVT for heparin  Goal of Therapy:  INR 2-3 Heparin level 0.3-0.7 units/ml Monitor platelets by anticoagulation protocol: Yes   Plan:  Decrease Heparin 1400 units/hr Check heparin level in 8 hours.  Coumadin 5 mg today  Phillis Knack, PharmD, BCPS  04/10/2015 3:02 AM

## 2015-04-10 NOTE — Progress Notes (Signed)
[place] for heparin  Indication: RUE DVT  Allergies  Allergen Reactions  . Penicillins Itching and Rash   Patient Measurements: Height: 6' (182.9 cm) Weight: 179 lb 7.3 oz (81.4 kg) IBW/kg (Calculated) : 77.6  Vital Signs: Temp: 98.3 F (36.8 C) (09/03 2000) Temp Source: Oral (09/03 2000) BP: 101/65 mmHg (09/03 2130) Pulse Rate: 94 (09/03 2130)  Labs:  Recent Labs  04/09/15 1004 04/09/15 1523 04/10/15 0230 04/10/15 0240 04/10/15 1210 04/10/15 2225  HGB 9.6*  --   --  8.9*  --   --   HCT 30.0*  --   --  27.5*  --   --   PLT 163  --   --  150  --   --   LABPROT  --   --  17.0*  --   --   --   INR  --   --  1.37  --   --   --   HEPARINUNFRC 0.63  --  0.82*  --  0.81* 0.92*  CREATININE  --  10.51*  --   --   --   --    Estimated Creatinine Clearance: 7.9 mL/min (by C-G formula based on Cr of 10.51).   Assessment: 64 y/o male with RUE DVT for heparin.  Heparin   Goal of Therapy:  INR 2-3 Heparin level 0.3-0.7 units/ml Monitor platelets by anticoagulation protocol: Yes   Plan:  Decrease Heparin 1000 units/hr Follow-up am labs.    Phillis Knack, PharmD, BCPS  04/10/2015 10:47 PM

## 2015-04-10 NOTE — Progress Notes (Signed)
[facility] ROUNDING NOTE   Subjective:   Interval History:  No complaints today although is a little confused  Objective:  Vital signs in last 24 hours:  Temp:  [97 F (36.1 C)-98.9 F (37.2 C)] 98.4 F (36.9 C) (09/03 0839) Pulse Rate:  [60-89] 81 (09/03 0600) Resp:  [15-27] 25 (09/03 0600) BP: (90-180)/(40-93) 132/56 mmHg (09/03 0633) SpO2:  [93 %-98 %] 94 % (09/03 0600) Weight:  [81.4 kg (179 lb 7.3 oz)-85.9 kg (189 lb 6 oz)] 81.4 kg (179 lb 7.3 oz) (09/03 0500)  Weight change:  Filed Weights   04/08/15 0615 04/09/15 1430 04/10/15 0500  Weight: 87.1 kg (192 lb 0.3 oz) 85.9 kg (189 lb 6 oz) 81.4 kg (179 lb 7.3 oz)    Intake/Output: I/O last 3 completed shifts: In: 960.9 [P.O.:540; I.V.:420.9] Out: 2200 [Other:2200]   Intake/Output this shift:     CVS- RRR RS- CTA ABD- BS present soft non-distended EXT- no edema   Basic Metabolic Panel:  Recent Labs Lab 04/04/15 0428 04/05/15 0300 04/07/15 0047 04/09/15 1523  NA 138 140 140 135  K 4.0 4.2 4.3 4.4  CL 102 103 102 98*  CO2 24 23 26  21*  GLUCOSE 78 103* 98 77  BUN 27* 38* 40* 54*  CREATININE 6.90* 9.26* 9.40* 10.51*  CALCIUM 8.0* 8.8* 9.3 8.8*  PHOS  --   --   --  6.5*    Liver Function Tests:  Recent Labs Lab 04/09/15 1523  ALBUMIN 2.5*   No results for input(s): LIPASE, AMYLASE in the last 168 hours. No results for input(s): AMMONIA in the last 168 hours.  CBC:  Recent Labs Lab 04/04/15 0428 04/05/15 0300 04/07/15 0047 04/09/15 1004 04/10/15 0240  WBC 7.3 11.6* 10.4 14.6* 13.6*  HGB 8.0* 8.2* 8.4* 9.6* 8.9*  HCT 24.3* 25.0* 26.4* 30.0* 27.5*  MCV 81.5 81.7 83.3 81.5 81.6  PLT 76* 153 145* 163 150    Cardiac Enzymes: No results for input(s): CKTOTAL, CKMB, CKMBINDEX, TROPONINI in the last 168 hours.  BNP: Invalid input(s): POCBNP  CBG:  Recent Labs Lab 04/04/15 1544 04/04/15 2019 04/05/15 0037 04/05/15 0354 04/05/15 1237  GLUCAP 79 103* 98 88 96     Microbiology: Results for orders placed or performed during the hospital encounter of 03/18/2015  MRSA PCR Screening     Status: None   Collection Time: 03/14/2015  8:33 PM  Result Value Ref Range Status   MRSA by PCR NEGATIVE NEGATIVE Final    Comment:        The GeneXpert MRSA Assay (FDA approved for NASAL specimens only), is one component of a comprehensive MRSA colonization surveillance program. It is not intended to diagnose MRSA infection nor to guide or monitor treatment for MRSA infections.   Surgical pcr screen     Status: None   Collection Time: 04/01/15  1:06 AM  Result Value Ref Range Status   MRSA, PCR NEGATIVE NEGATIVE Final   Staphylococcus aureus NEGATIVE NEGATIVE Final    Comment:        The Xpert SA Assay (FDA approved for NASAL specimens in patients over 26 years of age), is one component of a comprehensive surveillance program.  Test performance has been validated by [facility] for patients greater than or equal to 27 year old. It is not intended to diagnose infection nor to guide or monitor treatment.     Coagulation Studies:  Recent Labs  04/10/15 0230  LABPROT 17.0*  INR 1.37  Urinalysis: No results for input(s): COLORURINE, LABSPEC, PHURINE, GLUCOSEU, HGBUR, BILIRUBINUR, KETONESUR, PROTEINUR, UROBILINOGEN, NITRITE, LEUKOCYTESUR in the last 72 hours.  Invalid input(s): APPERANCEUR    Imaging: No results found.   Medications:   . sodium chloride    . sodium chloride Stopped (04/07/15 1000)  . heparin 1,400 Units/hr (04/10/15 0841)   . amiodarone  400 mg Oral BID  . amLODipine  10 mg Oral Daily  . aspirin EC  325 mg Oral Daily  . bisacodyl  10 mg Oral Daily   Or  . bisacodyl  10 mg Rectal Daily  . calcitRIOL  0.5 mcg Oral Daily  . calcium carbonate  800 mg of elemental calcium Oral BID WC  . cloNIDine  0.3 mg Transdermal Weekly  . docusate sodium  200 mg Oral Daily  . feeding supplement (NEPRO CARB STEADY)  237 mL Oral  BID BM  . ferric gluconate (FERRLECIT/NULECIT) IV  125 mg Intravenous Q T,Th,Sa-HD  . hydrALAZINE  50 mg Oral 3 times per day  . metoprolol tartrate  75 mg Oral BID  . multivitamin  1 tablet Oral QHS  . pantoprazole  40 mg Oral Daily  . sodium chloride  3 mL Intravenous Q12H  . warfarin  5 mg Oral ONCE-1800  . Warfarin - Pharmacist Dosing Inpatient   Does not apply q1800   acetaminophen, calcium carbonate, haloperidol lactate, hydrALAZINE, metoprolol, ondansetron (ZOFRAN) IV, oxyCODONE, sodium chloride  Assessment/ Plan:  10 Days Post-Op Procedure(s) (LRB): REPAIR OF ASCENDING AORTIC DISSECTION, HYPOTHERMIC CIRCULATORY ARREST, RIGHT AXILLARY CANNULATION (N/A)  1. ESRD  ---- usual TTS  Although now off schedule  K 4.4  2. Anemia  --  Hb 8.9   Will start aranesp  3. Bones  --- Ca   8.8  Calcitriol   4. HTN --   Numerous BP meds  With controlled BP     LOS: 10 Jeffery Gammell W @TODAY @10 :18 AM

## 2015-04-11 ENCOUNTER — Inpatient Hospital Stay (HOSPITAL_COMMUNITY): Payer: Medicare HMO

## 2015-04-11 DIAGNOSIS — I469 Cardiac arrest, cause unspecified: Secondary | ICD-10-CM

## 2015-04-11 DIAGNOSIS — R579 Shock, unspecified: Secondary | ICD-10-CM

## 2015-04-11 DIAGNOSIS — I7100 Dissection of unspecified site of aorta: Secondary | ICD-10-CM

## 2015-04-11 DIAGNOSIS — J9601 Acute respiratory failure with hypoxia: Secondary | ICD-10-CM

## 2015-04-11 DIAGNOSIS — R072 Precordial pain: Secondary | ICD-10-CM

## 2015-04-11 DIAGNOSIS — R570 Cardiogenic shock: Secondary | ICD-10-CM

## 2015-04-11 LAB — HEPARIN LEVEL (UNFRACTIONATED): Heparin Unfractionated: 1.6 IU/mL — ABNORMAL HIGH (ref 0.30–0.70)

## 2015-04-11 LAB — PROTIME-INR
INR: 1.98 — AB (ref 0.00–1.49)
INR: 3.09 — ABNORMAL HIGH (ref 0.00–1.49)
PROTHROMBIN TIME: 31.3 s — AB (ref 11.6–15.2)
Prothrombin Time: 22.4 seconds — ABNORMAL HIGH (ref 11.6–15.2)

## 2015-04-11 LAB — POCT I-STAT 3, ART BLOOD GAS (G3+)
ACID-BASE DEFICIT: 8 mmol/L — AB (ref 0.0–2.0)
ACID-BASE DEFICIT: 6 mmol/L — AB (ref 0.0–2.0)
Acid-base deficit: 11 mmol/L — ABNORMAL HIGH (ref 0.0–2.0)
BICARBONATE: 23.4 meq/L (ref 20.0–24.0)
Bicarbonate: 15.7 mEq/L — ABNORMAL LOW (ref 20.0–24.0)
Bicarbonate: 16.9 mEq/L — ABNORMAL LOW (ref 20.0–24.0)
Bicarbonate: 18.4 mEq/L — ABNORMAL LOW (ref 20.0–24.0)
O2 SAT: 94 %
O2 SAT: 95 %
O2 SAT: 96 %
O2 Saturation: 96 %
PCO2 ART: 39.2 mmHg (ref 35.0–45.0)
PH ART: 7.21 — AB (ref 7.350–7.450)
PH ART: 7.316 — AB (ref 7.350–7.450)
Patient temperature: 98.6
TCO2: 17 mmol/L (ref 0–100)
TCO2: 18 mmol/L (ref 0–100)
TCO2: 19 mmol/L (ref 0–100)
TCO2: 24 mmol/L (ref 0–100)
pCO2 arterial: 33.1 mmHg — ABNORMAL LOW (ref 35.0–45.0)
pCO2 arterial: 32.2 mmHg — ABNORMAL LOW (ref 35.0–45.0)
pCO2 arterial: 33.5 mmHg — ABNORMAL LOW (ref 35.0–45.0)
pH, Arterial: 7.365 (ref 7.350–7.450)
pH, Arterial: 7.452 — ABNORMAL HIGH (ref 7.350–7.450)
pO2, Arterial: 82 mmHg (ref 80.0–100.0)
pO2, Arterial: 85 mmHg (ref 80.0–100.0)
pO2, Arterial: 83 mmHg (ref 80.0–100.0)
pO2, Arterial: 76 mmHg — ABNORMAL LOW (ref 80.0–100.0)

## 2015-04-11 LAB — CBC
HEMATOCRIT: 22.5 % — AB (ref 39.0–52.0)
Hemoglobin: 7.6 g/dL — ABNORMAL LOW (ref 13.0–17.0)
MCH: 27 pg (ref 26.0–34.0)
MCHC: 33.8 g/dL (ref 30.0–36.0)
MCV: 79.8 fL (ref 78.0–100.0)
Platelets: 197 10*3/uL (ref 150–400)
RBC: 2.82 MIL/uL — AB (ref 4.22–5.81)
RDW: 17.7 % — ABNORMAL HIGH (ref 11.5–15.5)
WBC: 42.4 10*3/uL — AB (ref 4.0–10.5)

## 2015-04-11 LAB — RENAL FUNCTION PANEL
Albumin: 2 g/dL — ABNORMAL LOW (ref 3.5–5.0)
Anion gap: 22 — ABNORMAL HIGH (ref 5–15)
BUN: 52 mg/dL — ABNORMAL HIGH (ref 6–20)
CALCIUM: 7.7 mg/dL — AB (ref 8.9–10.3)
CO2: 17 mmol/L — AB (ref 22–32)
CREATININE: 10.28 mg/dL — AB (ref 0.61–1.24)
Chloride: 95 mmol/L — ABNORMAL LOW (ref 101–111)
GFR calc non Af Amer: 5 mL/min — ABNORMAL LOW (ref >60)
GFR, EST AFRICAN AMERICAN: 5 mL/min — AB (ref >60)
GLUCOSE: 136 mg/dL — AB (ref 65–99)
Phosphorus: 10.8 mg/dL — ABNORMAL HIGH (ref 2.5–4.6)
Potassium: 4.7 mmol/L (ref 3.5–5.1)
SODIUM: 134 mmol/L — AB (ref 135–145)

## 2015-04-11 LAB — COMPREHENSIVE METABOLIC PANEL
ALBUMIN: 2.4 g/dL — AB (ref 3.5–5.0)
ALK PHOS: 63 U/L (ref 38–126)
ALT: 209 U/L — AB (ref 17–63)
ANION GAP: 19 — AB (ref 5–15)
AST: 316 U/L — ABNORMAL HIGH (ref 15–41)
BUN: 53 mg/dL — ABNORMAL HIGH (ref 6–20)
CHLORIDE: 97 mmol/L — AB (ref 101–111)
CO2: 18 mmol/L — AB (ref 22–32)
Calcium: 8.8 mg/dL — ABNORMAL LOW (ref 8.9–10.3)
Creatinine, Ser: 10.78 mg/dL — ABNORMAL HIGH (ref 0.61–1.24)
GFR calc non Af Amer: 4 mL/min — ABNORMAL LOW (ref >60)
GFR, EST AFRICAN AMERICAN: 5 mL/min — AB (ref >60)
GLUCOSE: 114 mg/dL — AB (ref 65–99)
Potassium: 4.9 mmol/L (ref 3.5–5.1)
SODIUM: 134 mmol/L — AB (ref 135–145)
Total Bilirubin: 1 mg/dL (ref 0.3–1.2)
Total Protein: 5.6 g/dL — ABNORMAL LOW (ref 6.5–8.1)

## 2015-04-11 LAB — POCT I-STAT, CHEM 8
BUN: 54 mg/dL — ABNORMAL HIGH (ref 6–20)
Calcium, Ion: 1.05 mmol/L — ABNORMAL LOW (ref 1.13–1.30)
Chloride: 100 mmol/L — ABNORMAL LOW (ref 101–111)
Creatinine, Ser: 9.5 mg/dL — ABNORMAL HIGH (ref 0.61–1.24)
GLUCOSE: 115 mg/dL — AB (ref 65–99)
HEMATOCRIT: 23 % — AB (ref 39.0–52.0)
HEMOGLOBIN: 7.8 g/dL — AB (ref 13.0–17.0)
POTASSIUM: 4.4 mmol/L (ref 3.5–5.1)
SODIUM: 134 mmol/L — AB (ref 135–145)
TCO2: 15 mmol/L (ref 0–100)

## 2015-04-11 LAB — GLUCOSE, CAPILLARY
Glucose-Capillary: 126 mg/dL — ABNORMAL HIGH (ref 65–99)
Glucose-Capillary: 130 mg/dL — ABNORMAL HIGH (ref 65–99)
Glucose-Capillary: 145 mg/dL — ABNORMAL HIGH (ref 65–99)
Glucose-Capillary: 116 mg/dL — ABNORMAL HIGH (ref 65–99)

## 2015-04-11 LAB — C DIFFICILE QUICK SCREEN W PCR REFLEX
C DIFFICILE (CDIFF) INTERP: NEGATIVE
C DIFFICILE (CDIFF) TOXIN: NEGATIVE
C Diff antigen: NEGATIVE

## 2015-04-11 LAB — PHOSPHORUS: PHOSPHORUS: 9.3 mg/dL — AB (ref 2.5–4.6)

## 2015-04-11 LAB — LACTIC ACID, PLASMA
LACTIC ACID, VENOUS: 8.3 mmol/L — AB (ref 0.5–2.0)
LACTIC ACID, VENOUS: 4.6 mmol/L — AB (ref 0.5–2.0)

## 2015-04-11 LAB — CBC WITH DIFFERENTIAL/PLATELET
BASOS PCT: 0 % (ref 0–1)
Basophils Absolute: 0.1 10*3/uL (ref 0.0–0.1)
EOS ABS: 0.3 10*3/uL (ref 0.0–0.7)
EOS PCT: 1 % (ref 0–5)
HCT: 26.9 % — ABNORMAL LOW (ref 39.0–52.0)
HEMOGLOBIN: 8.6 g/dL — AB (ref 13.0–17.0)
LYMPHS ABS: 2.1 10*3/uL (ref 0.7–4.0)
Lymphocytes Relative: 10 % — ABNORMAL LOW (ref 12–46)
MCH: 26.3 pg (ref 26.0–34.0)
MCHC: 32 g/dL (ref 30.0–36.0)
MCV: 82.3 fL (ref 78.0–100.0)
Monocytes Absolute: 0.8 10*3/uL (ref 0.1–1.0)
Monocytes Relative: 4 % (ref 3–12)
NEUTROS PCT: 84 % — AB (ref 43–77)
Neutro Abs: 16.8 10*3/uL — ABNORMAL HIGH (ref 1.7–7.7)
PLATELETS: 171 10*3/uL (ref 150–400)
RBC: 3.27 MIL/uL — AB (ref 4.22–5.81)
RDW: 17.7 % — ABNORMAL HIGH (ref 11.5–15.5)
WBC: 19.9 10*3/uL — AB (ref 4.0–10.5)

## 2015-04-11 LAB — APTT

## 2015-04-11 LAB — TROPONIN I
TROPONIN I: 0.42 ng/mL — AB (ref <0.031)
Troponin I: 0.91 ng/mL (ref <0.031)
Troponin I: 1.37 ng/mL (ref <0.031)

## 2015-04-11 LAB — PROCALCITONIN: PROCALCITONIN: 2.56 ng/mL

## 2015-04-11 LAB — MAGNESIUM: Magnesium: 2.7 mg/dL — ABNORMAL HIGH (ref 1.7–2.4)

## 2015-04-11 MED ORDER — SODIUM CHLORIDE 0.9 % IV SOLN: INTRAVENOUS | Status: DC | PRN

## 2015-04-11 MED ORDER — MIDAZOLAM HCL 2 MG/2ML IJ SOLN
INTRAMUSCULAR | Status: AC
  Administered 2015-04-11: 2 mg
  Filled 2015-04-11: qty 2

## 2015-04-11 MED ORDER — ANTISEPTIC ORAL RINSE SOLUTION (CORINZ)
7.0000 mL | Freq: Four times a day (QID) | OROMUCOSAL | Status: DC
  Administered 2015-04-11 – 2015-04-13 (×5): 7 mL via OROMUCOSAL

## 2015-04-11 MED ORDER — PHENYLEPHRINE HCL 10 MG/ML IJ SOLN
0.0000 ug/min | INTRAVENOUS | Status: DC
  Filled 2015-04-11: qty 1

## 2015-04-11 MED ORDER — AMIODARONE HCL 200 MG PO TABS: 400.0000 mg | ORAL_TABLET | Freq: Two times a day (BID) | ORAL | Status: DC

## 2015-04-11 MED ORDER — ALBUMIN HUMAN 5 % IV SOLN
12.5000 g | Freq: Once | INTRAVENOUS | Status: AC
  Administered 2015-04-11: 12.5 g via INTRAVENOUS
  Filled 2015-04-11: qty 250

## 2015-04-11 MED ORDER — FENTANYL CITRATE (PF) 100 MCG/2ML IJ SOLN
25.0000 ug | INTRAMUSCULAR | Status: DC | PRN
  Administered 2015-04-11 – 2015-04-12 (×5): 75 ug via INTRAVENOUS
  Filled 2015-04-11 (×6): qty 2

## 2015-04-11 MED ORDER — LORAZEPAM 2 MG/ML IJ SOLN
1.0000 mg | INTRAMUSCULAR | Status: DC | PRN
  Administered 2015-04-11 – 2015-04-12 (×4): 2 mg via INTRAVENOUS
  Filled 2015-04-11 (×4): qty 1

## 2015-04-11 MED ORDER — FENTANYL CITRATE (PF) 100 MCG/2ML IJ SOLN
INTRAMUSCULAR | Status: AC
  Administered 2015-04-11: 100 ug
  Filled 2015-04-11: qty 2

## 2015-04-11 MED ORDER — CALCITRIOL 0.5 MCG PO CAPS
0.5000 ug | ORAL_CAPSULE | Freq: Every day | ORAL | Status: DC
  Filled 2015-04-11 (×2): qty 1

## 2015-04-11 MED ORDER — VASOPRESSIN 20 UNIT/ML IV SOLN
0.0300 [IU]/min | INTRAVENOUS | Status: DC
  Administered 2015-04-11 – 2015-04-12 (×4): 0.03 [IU]/min via INTRAVENOUS
  Filled 2015-04-11 (×3): qty 2

## 2015-04-11 MED ORDER — AMIODARONE HCL IN DEXTROSE 360-4.14 MG/200ML-% IV SOLN
INTRAVENOUS | Status: AC
Start: 2015-04-11 — End: 2015-04-11
  Administered 2015-04-11: 200 mL
  Filled 2015-04-11: qty 200

## 2015-04-11 MED ORDER — CHLORHEXIDINE GLUCONATE 0.12% ORAL RINSE (MEDLINE KIT)
15.0000 mL | Freq: Two times a day (BID) | OROMUCOSAL | Status: DC
  Administered 2015-04-11 – 2015-04-12 (×3): 15 mL via OROMUCOSAL

## 2015-04-11 MED ORDER — NOREPINEPHRINE BITARTRATE 1 MG/ML IV SOLN
0.0000 ug/min | INTRAVENOUS | Status: DC
  Administered 2015-04-11 (×2): 35 ug/min via INTRAVENOUS
  Administered 2015-04-11: 38 ug/min via INTRAVENOUS
  Administered 2015-04-11: 40 ug/min via INTRAVENOUS
  Administered 2015-04-12 – 2015-04-13 (×3): 100 ug/min via INTRAVENOUS
  Filled 2015-04-11 (×13): qty 16

## 2015-04-11 MED ORDER — NOREPINEPHRINE BITARTRATE 1 MG/ML IV SOLN
0.0000 ug/min | INTRAVENOUS | Status: DC
  Filled 2015-04-11: qty 4

## 2015-04-11 MED ORDER — AMIODARONE HCL IN DEXTROSE 360-4.14 MG/200ML-% IV SOLN
30.0000 mg/h | INTRAVENOUS | Status: DC
  Administered 2015-04-11 (×2): 30 mg/h via INTRAVENOUS
  Administered 2015-04-11: 60 mg/h via INTRAVENOUS
  Administered 2015-04-11 – 2015-04-12 (×3): 30 mg/h via INTRAVENOUS
  Filled 2015-04-11 (×11): qty 200

## 2015-04-11 MED ORDER — DEXTROSE 5 % IV SOLN
500.0000 mg | Freq: Two times a day (BID) | INTRAVENOUS | Status: DC
  Administered 2015-04-11 – 2015-04-12 (×3): 500 mg via INTRAVENOUS
  Filled 2015-04-11 (×5): qty 0.5

## 2015-04-11 MED ORDER — METRONIDAZOLE IN NACL 5-0.79 MG/ML-% IV SOLN
500.0000 mg | Freq: Four times a day (QID) | INTRAVENOUS | Status: DC
  Administered 2015-04-11 – 2015-04-12 (×6): 500 mg via INTRAVENOUS
  Filled 2015-04-11 (×8): qty 100

## 2015-04-11 MED ORDER — VANCOMYCIN HCL 10 G IV SOLR
1500.0000 mg | Freq: Once | INTRAVENOUS | Status: AC
  Administered 2015-04-11: 1500 mg via INTRAVENOUS
  Filled 2015-04-11: qty 1500

## 2015-04-11 MED ORDER — SODIUM BICARBONATE 8.4 % IV SOLN: 200.0000 meq | Freq: Once | INTRAVENOUS | Status: DC

## 2015-04-11 NOTE — Progress Notes (Signed)
eLink Physician-Brief Progress Note Patient Name: Matthew Kane DOB: 04-30-1951 MRN: [id number]   Date of Service  04/11/2015  HPI/Events of Note  Shock - on max levo gtt Very hypoxic on 100% FIO2 Unequal pupils - have normalised now per RN  eICU Interventions  High risk transport - change to portable headt CT stat to r/o ICH     Intervention Category Major Interventions: Shock - evaluation and management  Robyn Galati V. 04/11/2015, 4:17 AM

## 2015-04-11 NOTE — Progress Notes (Signed)
CRITICAL VALUE ALERT  Critical value received:  Troponin  Date of notification:  04/11/15  Time of notification:  1200  Critical value read back:Yes.    Nurse who received alert:  Rachel Moulds  MD notified (1st page):  Roxan Hockey  Time of first page:  1118  MD notified (2nd page):  Time of second page:  Responding MD:  Roxan Hockey  Time MD responded:  At the bedside

## 2015-04-11 NOTE — Progress Notes (Signed)
      [address]       [place] [address]             [phone]      BP remains labile  BP 122/57 mmHg  Pulse 91  Temp(Src) 96.1 F (35.6 C) (Oral)  Resp 16  Ht 6' (1.829 m)  Wt 179 lb 7.3 oz (81.4 kg)  BMI 24.33 kg/m2  SpO2 94%   Intake/Output Summary (Last 24 hours) at 04/11/15 1238 Last data filed at 04/11/15 0700  Gross per 24 hour  Intake 1420.53 ml  Output      0 ml  Net 1420.53 ml    He is on vasopressin and levophed. Bp remains labile- will give an albumin as he may be intravascularly dry due to 3rd spacing  His WBC is 42K up from 19K. He is having diarrhea. KUB was unremarkable but I'm concerned there may be an intra-abdominal source for his sepsis.  Will obtain CT chest/ abdomen. There is some risk with transport to CT but we need to see if there is a potentially treatable source.   Troponin is mildly elevated at 0.9. I suspect due to defib/ CPR.   Revonda Standard Roxan Hockey, MD [facility] [phone]

## 2015-04-11 NOTE — Progress Notes (Signed)
PULMONARY / CRITICAL CARE MEDICINE   Name: Matthew Kane MRN: [id number] DOB: July 08, 1951    ADMISSION DATE:  04/01/2015 CONSULTATION DATE:  04/11/2015  REFERRING MD :  Dr. Roxan Hockey  CHIEF COMPLAINT:  Post Cardiac Arrest  INITIAL PRESENTATION: Presented to outside hospital with chest pain and nausea. Transferred to Hospital for left heart catheterization.  STUDIES: Portable CXR 9/4 - questionable retrocardiac opacity  SIGNIFICANT EVENTS: 8/24 - admit after transfer from OSH 8/24 - surgical repair of type A aortic dissection 8/28 - extubated 9/4 - reintubated post arrest. Refractory shock, broad spec abx started. Etiology of arrest not clear. Left IJ CVL placed.  9/5 - remains in shock: on levo and vasopressin. Added dopamine for low scvo2, also transfused 2 units given low hgb and low scvo2. Working dx of septic shock +/- cardiogenic component in setting of severe acidosis. K rising. Will need CRRT  SUBJECTIVE:  Sedated on vent  VITAL SIGNS: Temp:  [97.6 F (36.4 C)-98.6 F (37 C)] 97.9 F (36.6 C) (09/05 0833) Pulse Rate:  [90-91] 91 (09/05 0833) Resp:  [14-31] 20 (09/05 0833) BP: (105-122)/(47-66) 105/47 mmHg (09/05 0728) SpO2:  [92 %-100 %] 96 % (09/05 0833) Arterial Line BP: (62-157)/(39-113) 132/57 mmHg (09/05 0833) FiO2 (%):  [60 %-80 %] 60 % (09/05 0833) Weight:  [88.2 kg (194 lb 7.1 oz)] 88.2 kg (194 lb 7.1 oz) (09/05 0430) HEMODYNAMICS: CVP:  [6 mmHg-8 mmHg] 8 mmHg VENTILATOR SETTINGS: Vent Mode:  [-] PRVC FiO2 (%):  [60 %-80 %] 60 % Set Rate:  [14 bmp] 14 bmp Vt Set:  [620 mL] 620 mL PEEP:  [5 cmH20-10 cmH20] 10 cmH20 Plateau Pressure:  [22 cmH20-28 cmH20] 25 cmH20 INTAKE / OUTPUT:  Intake/Output Summary (Last 24 hours) at 04/12/15 0910 Last data filed at 04/12/15 0800  Gross per 24 hour  Intake 2614.41 ml  Output    770 ml  Net 1844.41 ml    PHYSICAL EXAMINATION: General:   No distress. Eyes open. Room filled with multiple medical personnel.   Integument:  Warm & dry. No rash on exposed skin. No bruising. Lymphatics:  No appreciated cervical or supraclavicular lymphadenoapthy. HEENT:  Moist mucus membranes. No scleral injection or icterus.  Cardiovascular:  Regular rate. No edema. No appreciable JVD.  Pulmonary:  Coarse breath sounds bilatearlly. Symmetric chest wall rise.  Abdomen: Soft. Normal bowel sounds. Nondistended.  Musculoskeletal:  Normal bulk and tone. No joint deformity or effusion appreciated. Neurological:  PERRL. patient not spontaneously moving extremities.  LABS:  CBC  Recent Labs Lab 04/11/15 0137 04/11/15 0220 04/11/15 1118 04/12/15 0411  WBC 19.9*  --  42.4* 37.1*  HGB 8.6* 7.8* 7.6* 7.1*  HCT 26.9* 23.0* 22.5* 21.9*  PLT 171  --  197 190   Coag's  Recent Labs Lab 04/11/15 0137 04/11/15 1118 04/12/15 0411  APTT >200*  --   --   INR 1.98* 3.09* 5.32*   BMET  Recent Labs Lab 04/11/15 0137 04/11/15 0220 04/11/15 0438 04/12/15 0411  NA 134* 134* 134* 134*  K 4.9 4.4 4.7 6.3*  CL 97* 100* 95* 95*  CO2 18*  --  17* 15*  BUN 53* 54* 52* 68*  CREATININE 10.78* 9.50* 10.28* 11.61*  GLUCOSE 114* 115* 136* 47*   Electrolytes  Recent Labs Lab 04/11/15 0137 04/11/15 0438 04/12/15 0411  CALCIUM 8.8* 7.7* 7.8*  MG 2.7*  --  2.3  PHOS 9.3* 10.8* 11.4*   Sepsis Markers  Recent Labs Lab 04/11/15 0213 04/11/15 0437  04/11/15 1121 04/12/15 0411  LATICACIDVEN  --  8.3* 4.6*  --   PROCALCITON 2.56  --   --  42.02   ABG  Recent Labs Lab 04/11/15 1211 04/12/15 0112 04/12/15 0345  PHART 7.452* 7.354 7.284*  PCO2ART 33.5* 33.3* 32.5*  PO2ART 76.0* 124.0* 92.8   Liver Enzymes  Recent Labs Lab 04/11/15 0137 04/11/15 0438 04/12/15 0411  AST 316*  --   --   ALT 209*  --   --   ALKPHOS 63  --   --   BILITOT 1.0  --   --   ALBUMIN 2.4* 2.0* 2.3*   Cardiac Enzymes  Recent Labs Lab 04/11/15 0438 04/11/15 1118 04/11/15 1423  TROPONINI 0.42* 0.91* 1.37*    Glucose  Recent Labs Lab 04/05/15 1237 04/11/15 0108 04/11/15 0406 04/11/15 1223 04/11/15 1927  GLUCAP 96 126* 130* 145* 116*    Imaging Ct Abdomen Pelvis Wo Contrast  04/11/2015   CLINICAL DATA:  64 year old male status post thoracic aortic dissection repair. Possible sepsis.  EXAM: CT CHEST, ABDOMEN AND PELVIS WITHOUT CONTRAST  TECHNIQUE: Multidetector CT imaging of the chest, abdomen and pelvis was performed following the standard protocol without IV contrast.  COMPARISON:  CT of the chest, abdomen and pelvis 03/19/2015.  FINDINGS: CT CHEST FINDINGS  Mediastinum/Lymph Nodes: Status post median sternotomy. High attenuation fluid in the pericardial space overlying the right atrium measuring up to 3.4 x 9.6 cm, with mild compression of the adjacent right atrium. The remainder of the pericardium otherwise demonstrates minimal pericardial fluid and/or thickening. In the anterior mediastinum there is a moderate volume of high attenuation fluid, compatible with hematoma measuring up to 3.6 x 7.1 x 16 cm, immediately deep to the sternum. Left-sided internal jugular central venous catheter with tip terminating in the distal superior vena cava. Right-sided internal jugular PermCath with tips terminating in the mid and distal superior vena cava. There is atherosclerosis of the thoracic aorta, the great vessels of the mediastinum and the coronary arteries, including calcified atherosclerotic plaque in the left main, left anterior descending, left circumflex and right coronary arteries. Calcifications of the aortic valve, mitral valve and mitral annulus. Postoperative changes of graft repair of the ascending thoracic aorta are noted. There is a small amount of fluid and thickening of the ascending thoracic aorta immediately around the graft, expected in the immediate postoperative period. A dissection flap is faintly visualized in the descending thoracic aorta, extending into the abdominal aorta (poorly  visualized on today's noncontrast CT examination). Descending thoracic aorta is mildly aneurysmal measuring up to 4.5 cm in diameter. No pathologically enlarged mediastinal or hilar lymph nodes. Please note that accurate exclusion of hilar adenopathy is limited on noncontrast CT scans. Epicardial pacing wires are in position. Heart size is borderline enlarged. Esophagus is unremarkable in appearance, other than the indwelling nasogastric tube which extends into the body of the stomach. Endotracheal tube in position with tip at the level of the aortic arch. No axillary lymphadenopathy.  Lungs/Pleura: Moderate bilateral pleural effusions, with evidence of proteinaceous or hemorrhagic contents in the left pleural effusion (not a frank hemothorax though). Dependent atelectasis in the lower lobes of the lungs bilaterally. Mild peribronchovascular ground-glass attenuation in the lungs bilaterally, likely mild interstitial pulmonary edema.  Musculoskeletal/Soft Tissues: Median sternotomy wires. There are no aggressive appearing lytic or blastic lesions noted in the visualized portions of the skeleton.  CT ABDOMEN AND PELVIS FINDINGS  Hepatobiliary: 2.2 x 2.5 cm well-defined low-attenuation lesion adjacent to the gallbladder  fossa, unchanged, presumably a small cyst. Status post cholecystectomy.  Pancreas: No pancreatic mass or peripancreatic inflammatory changes identified on today's noncontrast CT examination.  Spleen: Unremarkable.  Adrenals/Urinary Tract: Bilateral adrenal glands are unremarkable in appearance. Bilateral kidneys are atrophic and there are multiple low-attenuation lesions in the kidneys bilaterally, which are incompletely characterized on today's noncontrast CT examination, but appear similar to prior studies. No hydroureteronephrosis. Urinary bladder is completely obscured by beam hardening artifact from the patient's bilateral hip arthroplasties.  Stomach/Bowel: Nasogastric tube terminating in the body  of the stomach. The appearance the stomach is otherwise unremarkable. No pathologic dilatation of small bowel or colon. Rectal bag in place. Normal appendix.  Vascular/Lymphatic: Extensive atherosclerosis throughout the abdominal and pelvic vasculature. The dissection flap noted extending into the abdominal aorta, but poorly evaluated on today's noncontrast CT examination. Aneurysmal dilatation of the common iliac arteries bilaterally (19 mm on the right and 18 mm on the left). No definite lymphadenopathy noted in the abdomen or pelvis on today's noncontrast CT examination.  Reproductive: Prostate gland and seminal vesicles are completely obscured by beam hardening artifact.  Other: No significant volume of ascites.  No pneumoperitoneum.  Musculoskeletal: There are no aggressive appearing lytic or blastic lesions noted in the visualized portions of the skeleton.  IMPRESSION: 1. No definite source of infection identified in the chest, abdomen or pelvis on today's examination. 2. Postoperative changes of recent graft repair of ascending thoracic aorta, with moderate anterior mediastinal hematoma, and moderate volume of loculated hemopericardium immediately adjacent to the right atrium, as above. 3. Moderate bilateral pleural effusions (left greater than right), with proteinaceous/hemorrhagic material in the left pleural effusion. These are associated with extensive dependent passive atelectasis in the lower lobes of the lungs bilaterally. 4. Mild interstitial pulmonary edema. 5. Additional incidental findings, as above, similar to prior examinations. These results were discussed in person at the time of interpretation on 04/11/2015 at 6:25 pm with Dr. Modesto Charon, who verbally acknowledged these results.   Electronically Signed   By: Vinnie Langton M.D.   On: 04/11/2015 18:37   Ct Chest Wo Contrast  04/11/2015   CLINICAL DATA:  64 year old male status post thoracic aortic dissection repair. Possible sepsis.   EXAM: CT CHEST, ABDOMEN AND PELVIS WITHOUT CONTRAST  TECHNIQUE: Multidetector CT imaging of the chest, abdomen and pelvis was performed following the standard protocol without IV contrast.  COMPARISON:  CT of the chest, abdomen and pelvis 03/13/2015.  FINDINGS: CT CHEST FINDINGS  Mediastinum/Lymph Nodes: Status post median sternotomy. High attenuation fluid in the pericardial space overlying the right atrium measuring up to 3.4 x 9.6 cm, with mild compression of the adjacent right atrium. The remainder of the pericardium otherwise demonstrates minimal pericardial fluid and/or thickening. In the anterior mediastinum there is a moderate volume of high attenuation fluid, compatible with hematoma measuring up to 3.6 x 7.1 x 16 cm, immediately deep to the sternum. Left-sided internal jugular central venous catheter with tip terminating in the distal superior vena cava. Right-sided internal jugular PermCath with tips terminating in the mid and distal superior vena cava. There is atherosclerosis of the thoracic aorta, the great vessels of the mediastinum and the coronary arteries, including calcified atherosclerotic plaque in the left main, left anterior descending, left circumflex and right coronary arteries. Calcifications of the aortic valve, mitral valve and mitral annulus. Postoperative changes of graft repair of the ascending thoracic aorta are noted. There is a small amount of fluid and thickening of the ascending thoracic aorta  immediately around the graft, expected in the immediate postoperative period. A dissection flap is faintly visualized in the descending thoracic aorta, extending into the abdominal aorta (poorly visualized on today's noncontrast CT examination). Descending thoracic aorta is mildly aneurysmal measuring up to 4.5 cm in diameter. No pathologically enlarged mediastinal or hilar lymph nodes. Please note that accurate exclusion of hilar adenopathy is limited on noncontrast CT scans. Epicardial  pacing wires are in position. Heart size is borderline enlarged. Esophagus is unremarkable in appearance, other than the indwelling nasogastric tube which extends into the body of the stomach. Endotracheal tube in position with tip at the level of the aortic arch. No axillary lymphadenopathy.  Lungs/Pleura: Moderate bilateral pleural effusions, with evidence of proteinaceous or hemorrhagic contents in the left pleural effusion (not a frank hemothorax though). Dependent atelectasis in the lower lobes of the lungs bilaterally. Mild peribronchovascular ground-glass attenuation in the lungs bilaterally, likely mild interstitial pulmonary edema.  Musculoskeletal/Soft Tissues: Median sternotomy wires. There are no aggressive appearing lytic or blastic lesions noted in the visualized portions of the skeleton.  CT ABDOMEN AND PELVIS FINDINGS  Hepatobiliary: 2.2 x 2.5 cm well-defined low-attenuation lesion adjacent to the gallbladder fossa, unchanged, presumably a small cyst. Status post cholecystectomy.  Pancreas: No pancreatic mass or peripancreatic inflammatory changes identified on today's noncontrast CT examination.  Spleen: Unremarkable.  Adrenals/Urinary Tract: Bilateral adrenal glands are unremarkable in appearance. Bilateral kidneys are atrophic and there are multiple low-attenuation lesions in the kidneys bilaterally, which are incompletely characterized on today's noncontrast CT examination, but appear similar to prior studies. No hydroureteronephrosis. Urinary bladder is completely obscured by beam hardening artifact from the patient's bilateral hip arthroplasties.  Stomach/Bowel: Nasogastric tube terminating in the body of the stomach. The appearance the stomach is otherwise unremarkable. No pathologic dilatation of small bowel or colon. Rectal bag in place. Normal appendix.  Vascular/Lymphatic: Extensive atherosclerosis throughout the abdominal and pelvic vasculature. The dissection flap noted extending into  the abdominal aorta, but poorly evaluated on today's noncontrast CT examination. Aneurysmal dilatation of the common iliac arteries bilaterally (19 mm on the right and 18 mm on the left). No definite lymphadenopathy noted in the abdomen or pelvis on today's noncontrast CT examination.  Reproductive: Prostate gland and seminal vesicles are completely obscured by beam hardening artifact.  Other: No significant volume of ascites.  No pneumoperitoneum.  Musculoskeletal: There are no aggressive appearing lytic or blastic lesions noted in the visualized portions of the skeleton.  IMPRESSION: 1. No definite source of infection identified in the chest, abdomen or pelvis on today's examination. 2. Postoperative changes of recent graft repair of ascending thoracic aorta, with moderate anterior mediastinal hematoma, and moderate volume of loculated hemopericardium immediately adjacent to the right atrium, as above. 3. Moderate bilateral pleural effusions (left greater than right), with proteinaceous/hemorrhagic material in the left pleural effusion. These are associated with extensive dependent passive atelectasis in the lower lobes of the lungs bilaterally. 4. Mild interstitial pulmonary edema. 5. Additional incidental findings, as above, similar to prior examinations. These results were discussed in person at the time of interpretation on 04/11/2015 at 6:25 pm with Dr. Modesto Charon, who verbally acknowledged these results.   Electronically Signed   By: Vinnie Langton M.D.   On: 04/11/2015 18:37   Dg Chest Port 1 View  04/12/2015   CLINICAL DATA:  Intubated.  EXAM: PORTABLE CHEST - 1 VIEW  COMPARISON:  Yesterday.  FINDINGS: Endotracheal tube in satisfactory position. Nasogastric tube extending into the stomach. Right jugular  double-lumen catheter tips in the superior vena cava. Left jugular catheter tip in the superior vena cava.  Stable enlarged cardiac silhouette and median sternotomy wires. Mildly decreased  bilateral pleural fluid. Interval mild bilateral perihilar linear density. No pneumothorax.  IMPRESSION: 1. Stable cardiomegaly. 2. Mildly decreased bilateral pleural fluid. 3. Mild bilateral perihilar atelectasis.   Electronically Signed   By: Claudie Revering M.D.   On: 04/12/2015 08:16   Dg Chest Port 1 View  04/11/2015   CLINICAL DATA:  Left central line placement. Status post ascending aortic dissection repair, postop day 11. Cardiac arrest this morning.  EXAM: PORTABLE CHEST - 1 VIEW  COMPARISON:  04/11/2015 at nine L1 a.m.  FINDINGS: New left internal jugular center venous catheter tip projects over the SVC.  Prior right dual lumen IJ catheter noted with distal tip projecting over the SVC.  Endotracheal tube appears satisfactorily positioned, tip approximately 4 cm above the carina.  Nasogastric tube enters the stomach.  Loop recorder device noted.  Moderate enlargement of the cardiopericardial silhouette with airspace opacities obscuring both lung bases, similar to the earlier exam from today. Tortuous thoracic aorta.  Degenerative arthropathy in both shoulders.  IMPRESSION: 1. The only significant change from the earlier exam is placement of a new left IJ catheter, the tip which projects over the SVC. No pneumothorax.   Electronically Signed   By: Van Clines M.D.   On: 04/11/2015 16:28     ASSESSMENT / PLAN:  PULMONARY OETT 9/4>> A: Acute Hypoxic Respiratory Failure in setting of HCAP and septic shock P:   Vent Bundle See ID section F/u abg F/u cxr  CARDIOVASCULAR R Tunneled IJ CVL A:  Type A Aortic Dissection Cardiac arrest Working dx of septic shock +/- cardiogenic component   P:  Heparin drip to resume when able  Trending cardiac biomarkers TTE Pressors per CVTS cvp goal 8-12  RENAL A:   ESRD - on HD Lactic acidosis: bicarb falling. Still pure AG acidosis mathematically.  Hyperkalemia  P:   Dialysis per nephrology Monitoring electrolytes daily May soon need  CRRT  GASTROINTESTINAL A:   H/O GERD Diarrhea -->Cdiff neg  P:   Protonix IV daily tubefeeds Add probiotic 9/5  HEMATOLOGIC A:   Anemia - no signs of active bleeding, but hgb drifting.  Surgical team transfusing 9/5  P:  Trending hemoglobin daily with CBC Defer heparin to primary team  INFECTIOUS A:   Working dx of HCAP and resultant septic shock. Also consider infected HD cath.   P:   Procalcitonin algorithm  BCx2 9/4>> Sputum 9/4>>  Abx:  Vancomycin, start date 9/4, day 2/x Aztreonam, start date 9/4, day 2/x  ENDOCRINE A:  No acute issues  P:   Monitor blood glucose daily on labs  NEUROLOGIC A:   Acute encephalopathy s/p cardiac arrest and in setting of what is probably HCAP CT head negative P:   RASS goal: 0 to -1 PAD protocol   FAMILY  - Updates: Family have been contacted by nursing staff making them aware of the patient's critical status and decompensation.  - Inter-disciplinary family meet or Palliative Care meeting due by:  9/11  TODAY'S SUMMARY: s/p cardiac arrest early in am 9/4. Suspect HCAP vs HD cath infection (favor PNA) and resultant septic shock, followed by respiratory failure and subsequent arrest as chain of events. Hemodynamically requiring significant pressor support. Dopamine added and transfused given low SCVO2. ECHO pending. He will likely need CRRT given rising K. HD support may also  help treat his shock if his acidosis is contributing to his cardiac function. Suspect prognosis is poor here. Not ready for weaning. No changes on vent today.   Erick Colace ACNP-BC [facility] Pulmonary/Critical Care Pager # [phone] OR # [phone] if no answer  Attending Note:  64 year old male with extensive PMH s/p cardiac arrest on 9/4 with suspected HCAP vs HD catheter infection causing septic shock that was likely the cause of arrest.  Very acidotic this AM with hyperkalemia and no UOP.  Suspect patient will need CRRT given pressor demand.  CVP of  8.  Will likely need CRRT running even but will defer to renal.  Hold off weaning for now.  Maintain on PEEP of 10 today and titrate O2 for sat of 88-92%.  The patient is critically ill with multiple organ systems failure and requires high complexity decision making for assessment and support, frequent evaluation and titration of therapies, application of advanced monitoring technologies and extensive interpretation of multiple databases.   Critical Care Time devoted to patient care services described in this note is  35  Minutes. This time reflects time of care of this signee Dr Jennet Maduro. This critical care time does not reflect procedure time, or teaching time or supervisory time of PA/NP/Med student/Med Resident etc but could involve care discussion time.  Rush Farmer, M.D. [facility] Pulmonary/Critical Care Medicine. Pager: [phone]. After hours pager: [phone].

## 2015-04-11 NOTE — Code Documentation (Signed)
  Patient Name: Matthew Kane   MRN: [id number]   Date of Birth/ Sex: [birth year] , male      Admission Date: 03/23/2015  Attending Provider: Gaye Pollack, MD  Primary Diagnosis: <principal problem not specified>   Indication: Pt was in his usual state of health until this 1 AM, when he was noted to be obtunded. Code blue was subsequently called. At the time of arrival on scene, ACLS protocol was underway.   Technical Description:  - CPR performance duration:  10 minutes  - Was defibrillation or cardioversion used? No No   - Was external pacer placed? No No  - Was patient intubated pre/post CPR? Yes, Post CPR. Yes   Medications Administered: Y = Yes; Blank = No Amiodarone    Atropine    Calcium    Epinephrine  Y  Lidocaine    Magnesium    Norepinephrine    Phenylephrine    Sodium bicarbonate    Vasopressin     Post CPR evaluation:  - Final Status - Was patient successfully resuscitated ? Yes - What is current rhythm? Sinus - What is current hemodynamic status? Stable  Miscellaneous Information:  - Labs sent, including:   - Primary team notified? Yes Yes  - Family Notified?  Not yet  - Additional notes/ transfer status:       Bethena Roys, MD  04/11/2015, 1:26 AM

## 2015-04-11 NOTE — Progress Notes (Signed)
CRITICAL VALUE ALERT  Critical value received:  Lactic Acid  Date of notification:  04/11/15  Time of notification:  1430  Critical value read back:Yes.    Nurse who received alert:  Agricultural consultant  MD notified (1st page):  Roxan Hockey  Time of first page:  1400  MD notified (2nd page):  Time of second page:  Responding MD:  Roxan Hockey  Time MD responded:  At the bedside

## 2015-04-11 NOTE — Progress Notes (Signed)
PULMONARY / CRITICAL CARE MEDICINE   Name: Matthew Kane MRN: [id number] DOB: 1951/04/24    ADMISSION DATE:  03/11/2015 CONSULTATION DATE:  04/11/2015  REFERRING MD :  Dr. Roxan Hockey  CHIEF COMPLAINT:  Post Cardiac Arrest  INITIAL PRESENTATION: Presented to outside hospital with chest pain and nausea. Transferred to Hospital for left heart catheterization.  STUDIES: Portable CXR 9/4 - questionable retrocardiac opacity  SIGNIFICANT EVENTS: 8/24 - admit after transfer from OSH 8/24 - surgical repair of type A aortic dissection 8/28 - extubated 9/4 - reintubated post arrest  HISTORY OF PRESENT ILLNESS:  History obtained from the chart and medical personnel at bedside. Known history of end-stage renal disease on hemodialysis. Transferred from outside hospital and found to have a Type A aortic dissection on heart catheterization. Patient underwent surgical treatment. Postextubation he did have some agitation that was improving. Patient became more agitated and combative in the early morning hours and had agonal respirations with bradycardia. Ultimately the patient underwent repeated resuscitation with defibrillation after he was found to be in V. fib. Dr. Roxan Hockey place a femoral arterial line for blood pressure monitoring. Initially patient did have asymmetric pupils but have since resolved.  SUBJECTIVE: No events beyond mentioned above.  VITAL SIGNS: Temp:  [96.1 F (35.6 C)-98.6 F (37 C)] 96.1 F (35.6 C) (09/04 0400) Pulse Rate:  [71-94] 91 (09/04 1147) Resp:  [15-47] 16 (09/04 1147) BP: (58-135)/(12-92) 122/57 mmHg (09/04 1147) SpO2:  [69 %-100 %] 94 % (09/04 1147) Arterial Line BP: (106-132)/(57-76) 130/73 mmHg (09/04 0700) FiO2 (%):  [80 %-100 %] 80 % (09/04 1147) HEMODYNAMICS:   VENTILATOR SETTINGS: Vent Mode:  [-] PRVC FiO2 (%):  [80 %-100 %] 80 % Set Rate:  [14 bmp] 14 bmp Vt Set:  [620 mL] 620 mL PEEP:  [5 cmH20] 5 cmH20 Plateau Pressure:  [20 cmH20-23 cmH20]  22 cmH20 INTAKE / OUTPUT:  Intake/Output Summary (Last 24 hours) at 04/11/15 1200 Last data filed at 04/11/15 0700  Gross per 24 hour  Intake 1420.53 ml  Output      0 ml  Net 1420.53 ml    PHYSICAL EXAMINATION: General:   No distress. Eyes open. Room filled with multiple medical personnel.  Integument:  Warm & dry. No rash on exposed skin. No bruising. Lymphatics:  No appreciated cervical or supraclavicular lymphadenoapthy. HEENT:  Moist mucus membranes. No scleral injection or icterus.  Cardiovascular:  Regular rate. No edema. No appreciable JVD.  Pulmonary:  Coarse breath sounds bilatearlly. Symmetric chest wall rise.  Abdomen: Soft. Normal bowel sounds. Nondistended.  Musculoskeletal:  Normal bulk and tone. No joint deformity or effusion appreciated. Neurological:  PERRL. patient not spontaneously moving extremities.  LABS:  CBC  Recent Labs Lab 04/10/15 0240 04/11/15 0137 04/11/15 0220 04/11/15 1118  WBC 13.6* 19.9*  --  42.4*  HGB 8.9* 8.6* 7.8* 7.6*  HCT 27.5* 26.9* 23.0* 22.5*  PLT 150 171  --  197   Coag's  Recent Labs Lab 04/10/15 0230 04/11/15 0137 04/11/15 1118  APTT  --  >200*  --   INR 1.37 1.98* 3.09*   BMET  Recent Labs Lab 04/09/15 1523 04/11/15 0137 04/11/15 0220 04/11/15 0438  NA 135 134* 134* 134*  K 4.4 4.9 4.4 4.7  CL 98* 97* 100* 95*  CO2 21* 18*  --  17*  BUN 54* 53* 54* 52*  CREATININE 10.51* 10.78* 9.50* 10.28*  GLUCOSE 77 114* 115* 136*   Electrolytes  Recent Labs Lab 04/09/15 1523 04/11/15 0137  04/11/15 0438  CALCIUM 8.8* 8.8* 7.7*  MG  --  2.7*  --   PHOS 6.5* 9.3* 10.8*   Sepsis Markers  Recent Labs Lab 04/11/15 0213 04/11/15 0437  LATICACIDVEN  --  8.3*  PROCALCITON 2.56  --    ABG  Recent Labs Lab 04/11/15 0135 04/11/15 0218 04/11/15 0317  PHART 7.316* 7.210* 7.365  PCO2ART 33.1* 39.2 32.2*  PO2ART 82.0 85.0 83.0   Liver Enzymes  Recent Labs Lab 04/09/15 1523 04/11/15 0137 04/11/15 0438   AST  --  316*  --   ALT  --  209*  --   ALKPHOS  --  63  --   BILITOT  --  1.0  --   ALBUMIN 2.5* 2.4* 2.0*   Cardiac Enzymes  Recent Labs Lab 04/11/15 0438 04/11/15 1118  TROPONINI 0.42* 0.91*   Glucose  Recent Labs Lab 04/04/15 2019 04/05/15 0037 04/05/15 0354 04/05/15 1237 04/11/15 0108 04/11/15 0406  GLUCAP 103* 98 88 96 126* 130*    Imaging Dg Chest Port 1 View  04/11/2015   CLINICAL DATA:  Status post ascending aortic dissection repair -postop day 11. Cardiac arrest this morning.  EXAM: PORTABLE CHEST - 1 VIEW  COMPARISON:  04/11/2015 and prior radiograph  FINDINGS: Cardiomegaly and median sternotomy wires again identified.  An endotracheal tube with tip 6 cm above the carina, NG tube entering the stomach with tip off the field of view, right IJ central venous catheter with tips overlying the mid and lower SVC again noted.  Left lower lung consolidation/atelectasis and new right basilar atelectasis/pleural effusion noted.  There is no evidence of pneumothorax.  No acute bony abnormalities are identified.  IMPRESSION: New right basilar atelectasis/ pleural effusion. Unchanged left lower lung consolidation/atelectasis.  Cardiomegaly and support apparatus apparatus as described.   Electronically Signed   By: Margarette Canada M.D.   On: 04/11/2015 10:30   Dg Chest Port 1 View  04/11/2015   CLINICAL DATA:  Endotracheal tube placement. Aortic dissection. Initial encounter.  EXAM: PORTABLE CHEST - 1 VIEW  COMPARISON:  Chest radiograph performed 04/05/2015  FINDINGS: The patient's endotracheal tube is seen ending 4 cm above the carina. A right-sided dual-lumen catheter is noted ending about the distal SVC.  Retrocardiac airspace opacity likely reflects a combination of pleural fluid and airspace consolidation, as on prior studies. No pneumothorax is seen.  The cardiomediastinal silhouette is enlarged. The patient is status post median sternotomy. No acute osseous abnormalities are seen. An  external pacing pad is noted.  IMPRESSION: 1. Endotracheal tube seen ending 4 cm above the carina. 2. Retrocardiac airspace opacity likely reflects a combination of pleural fluid and airspace consolidation, as on prior studies.   Electronically Signed   By: Garald Balding M.D.   On: 04/11/2015 02:07   Dg Abd Portable 1v  04/11/2015   CLINICAL DATA:  OG tube insertion.  Status post code.  EXAM: PORTABLE ABDOMEN - 1 VIEW  COMPARISON:  03/21/2015 CT  FINDINGS: The tip of an OG tube is noted overlying the proximal -mid stomach.  Gas in the colon is noted.  No dilated small bowel loops are present.  Bilateral hip replacements are identified.  No acute bony abnormalities are noted.  IMPRESSION: OG tube with tip overlying the proximal -mid stomach.   Electronically Signed   By: Margarette Canada M.D.   On: 04/11/2015 10:38   Ct Portable Head W/o Cm  04/11/2015   CLINICAL DATA:  Acute encephalopathy.  EXAM: CT  HEAD WITHOUT CONTRAST  TECHNIQUE: Contiguous axial images were obtained from the base of the skull through the vertex without intravenous contrast.  COMPARISON:  None.  FINDINGS: Mobile images are motion degraded. There is mild central and cortical atrophy. Periventricular white matter changes are consistent with small vessel disease. Endotracheal tube is in place. There is dense atherosclerotic calcification of the left common carotid artery and bilateral internal carotid arteries. No acute fracture.  IMPRESSION: 1. Atrophy and small vessel disease. 2.  No evidence for acute intracranial abnormality.   Electronically Signed   By: Nolon Nations M.D.   On: 04/11/2015 09:21     ASSESSMENT / PLAN:  PULMONARY OETT 9/4>> A: Acute Hypoxic Respiratory Failure Possible HCAP H/O OSA Severe hypoxemia P:   - Vent Bundle. - See ID section. - Hold weaning for now. - Adjust vent for ABG. - Increase PEEP to 10. - Concern for PE, will restart heparin after central line is placed.  CARDIOVASCULAR R Tunneled IJ  CVL A:  Type A Aortic Dissection Shock  P:  - Restart heparin after TLC is placed. - H/O HTN. - TTE pending. - Levophed Drip to maintain MAP >65.  RENAL A:   ESRD - on HD  P:   - Dialysis per nephrology. - Monitoring electrolytes daily. - Replace electrolytes as indicated.  GASTROINTESTINAL A:   H/O GERD  P:   - Protonix IV daily - Consult nutrition for TF as per nutrition.  HEMATOLOGIC A:   Anemia - no signs of active bleeding  P:  - Trending hemoglobin daily with CBC  INFECTIOUS A:   Possible HCAP  P:   - Procalcitonin algorithm - Trending lactic acid - Trending leukocytosis  BCx2 9/4>> Sputum 9/4>>  Abx:  - Vancomycin 9/4>>> - Aztreonam 9/4>>>  ENDOCRINE A:  No acute issues  P:   - Monitor blood glucose daily on labs  NEUROLOGIC A:   Altered mental status Initially unequal pupils  P:   - RASS goal: 0 to -1 - Ativan IV prn - Fentanyl IV prn - CT head negative, may restart heparin.  FAMILY  - Updates: No family bedside.  The patient is critically ill with multiple organ systems failure and requires high complexity decision making for assessment and support, frequent evaluation and titration of therapies, application of advanced monitoring technologies and extensive interpretation of multiple databases.   Critical Care Time devoted to patient care services described in this note is  45  Minutes. This time reflects time of care of this signee Dr Jennet Maduro. This critical care time does not reflect procedure time, or teaching time or supervisory time of PA/NP/Med student/Med Resident etc but could involve care discussion time.  Rush Farmer, M.D. [facility] Pulmonary/Critical Care Medicine. Pager: [phone]. After hours pager: [phone].

## 2015-04-11 NOTE — Code Documentation (Signed)
  Patient Name: Matthew Kane   MRN: [id number]   Date of Birth/ Sex: 09-23-1950 , male      Admission Date: 04/01/2015  Attending Provider: Gaye Pollack, MD  Primary Diagnosis: <principal problem not specified>   Indication: Pt was in his usual state of health until this 1 AM, when he was noted to be obtunded. Code blue was subsequently called. At the time of arrival on scene, ACLS protocol was underway.   Technical Description:  - CPR performance duration:  10  minutes  - Was defibrillation or cardioversion used? No   - Was external pacer placed? No  - Was patient intubated pre/post CPR? Yes   Medications Administered: Y = Yes; Blank = No Amiodarone    Atropine    Calcium    Epinephrine  Y  Lidocaine    Magnesium    Norepinephrine    Phenylephrine    Sodium bicarbonate    Vasopressin     Post CPR evaluation:  - Final Status - Was patient successfully resuscitated ? Yes - What is current rhythm? sinus - What is current hemodynamic status? stable  Miscellaneous Information:  - Labs sent, including:   - Primary team notified?  Yes  - Family Notified? pending  - Additional notes/ transfer status:      Zada Finders, MD  04/11/2015, 1:34 AM

## 2015-04-11 NOTE — Procedures (Signed)
Central Venous Catheter Insertion Procedure Note Matthew Kane [id number] [birth year]  Procedure: Insertion of Central Venous Catheter Indications: Assessment of intravascular volume, Drug and/or fluid administration and Frequent blood sampling  Procedure Details Consent: Risks of procedure as well as the alternatives and risks of each were explained to the (patient/caregiver).  Consent for procedure obtained. Time Out: Verified patient identification, verified procedure, site/side was marked, verified correct patient position, special equipment/implants available, medications/allergies/relevent history reviewed, required imaging and test results available.  Performed  Maximum sterile technique was used including antiseptics, cap, gloves, gown, hand hygiene, mask and sheet. Skin prep: Chlorhexidine; local anesthetic administered A antimicrobial bonded/coated triple lumen catheter was placed in the left internal jugular vein using the Seldinger technique.  Evaluation Blood flow good Complications: No apparent complications Patient did tolerate procedure well. Chest X-ray ordered to verify placement.  CXR: pending.  U/S used in placement.  Matthew Kane 04/11/2015, 3:21 PM

## 2015-04-11 NOTE — Consult Note (Signed)
PULMONARY / CRITICAL CARE MEDICINE   Name: Matthew Kane MRN: [id number] DOB: 04/09/1951    ADMISSION DATE:  03/18/2015 CONSULTATION DATE:  04/11/2015  REFERRING MD :  Dr. Roxan Hockey  CHIEF COMPLAINT:  Post Cardiac Arrest  INITIAL PRESENTATION: Presented to outside hospital with chest pain and nausea. Transferred to Hospital for left heart catheterization.  STUDIES: Portable CXR 9/4 - questionable retrocardiac opacity  SIGNIFICANT EVENTS: 8/24 - admit after transfer from OSH 8/24 - surgical repair of type A aortic dissection 8/28 - extubated 9/4 - reintubated post arrest  HISTORY OF PRESENT ILLNESS:  History obtained from the chart and medical personnel at bedside. Known history of end-stage renal disease on hemodialysis. Transferred from outside hospital and found to have a Type A aortic dissection on heart catheterization. Patient underwent surgical treatment. Postextubation he did have some agitation that was improving. Patient became more agitated and combative in the early morning hours and had agonal respirations with bradycardia. Ultimately the patient underwent repeated resuscitation with defibrillation after he was found to be in V. fib. Dr. Roxan Hockey place a femoral arterial line for blood pressure monitoring. Initially patient did have asymmetric pupils but have since resolved.  PAST MEDICAL HISTORY :   has a past medical history of Hypertension; Obesity; Gout; Gastroesophageal reflux disease; Erectile dysfunction; Prostatic hypertrophy, benign; Allergic rhinitis; Chronic kidney disease, stage 5, kidney failure; Secondary hyperparathyroidism of renal origin; Anemia; Dialysis patient; History of blood transfusion; Thrombocytopenia; Elevated troponin; Obstructive sleep apnea (2008); and Difficult intubation.  has past surgical history that includes Total hip arthroplasty; Portacath placement; Cholecystectomy; Finger amputation; Colonoscopy (07/12/2011); AV fistula placement  (05-31-11); Colostomy (Pt denies having this surgery - (12/17/14.); Joint replacement; AV fistula placement (04/01/2012); Revison of arteriovenous fistula (Left, 05/06/2014); shuntogram (N/A, 10/06/2011); Insertion of dialysis catheter (Right, 12/18/2014); Hematoma evacuation (Left, 12/18/2014); AV fistula placement (Left, 12/18/2014); Arteriovenous goretex graft removal (Left, 02/19/2015); Hematoma evacuation (Left, 02/19/2015); Cardiac catheterization (N/A, 03/20/2015); and Ascending aortic root replacement (N/A, 03/24/2015). Prior to Admission medications   Medication Sig Start Date End Date Taking? Authorizing Provider  allopurinol (ZYLOPRIM) 100 MG tablet Take 100 mg by mouth every evening.  06/27/13  Yes Historical Provider, MD  amLODipine (NORVASC) 10 MG tablet Take 10 mg by mouth every evening.    Yes Historical Provider, MD  cloNIDine (CATAPRES) 0.3 MG tablet Take 0.3 mg by mouth 2 (two) times daily.   Yes Historical Provider, MD  metoprolol (LOPRESSOR) 50 MG tablet Take 50 mg by mouth 2 (two) times daily. 04/09/13  Yes Historical Provider, MD  oxyCODONE (ROXICODONE) 5 MG immediate release tablet Take 1 tablet (5 mg total) by mouth every 6 (six) hours as needed for severe pain. 02/19/15  Yes Kimberly A Trinh, PA-C  b complex-vitamin c-folic acid (NEPHRO-VITE) 0.8 MG TABS Take 0.8 mg by mouth every morning.  11/13/11   Historical Provider, MD  calcitRIOL (ROCALTROL) 0.5 MCG capsule Take 1 capsule by mouth daily. 11/04/10   Historical Provider, MD  calcium carbonate (TUMS - DOSED IN MG ELEMENTAL CALCIUM) 500 MG chewable tablet Chew 1-4 tablets by mouth 3 (three) times daily with meals. Take 4 tablets with largest meal of the day and 1-2 with smaller meals and snacks    Historical Provider, MD  naproxen sodium (ALEVE) 220 MG tablet Take 440 mg by mouth 2 (two) times daily as needed (Pain).     Historical Provider, MD  triamcinolone cream (KENALOG) 0.1 % Apply 1 application topically daily as needed (for itching).  Historical Provider, MD   Allergies  Allergen Reactions  . Penicillins Itching and Rash    FAMILY HISTORY:  reported the following about his mother: myocardial infarction. He reported the following about his father: lung carcinoma.  SOCIAL HISTORY:  reports that he quit smoking about 40 years ago. His smoking use included Cigarettes. He has a 10 pack-year smoking history. He has never used smokeless tobacco. He reports that he does not drink alcohol or use illicit drugs.  REVIEW OF SYSTEMS:  Unable to obtain as currently he is intubated and with altered mentation.  SUBJECTIVE:   VITAL SIGNS: Temp:  [96.1 F (35.6 C)-98.6 F (37 C)] 96.1 F (35.6 C) (09/04 0400) Pulse Rate:  [71-94] 82 (09/04 0100) Resp:  [16-47] 47 (09/04 0200) BP: (69-135)/(12-92) 130/40 mmHg (09/04 0200) SpO2:  [92 %-97 %] 97 % (09/04 0100) FiO2 (%):  [100 %] 100 % (09/04 0200) Weight:  [81.4 kg (179 lb 7.3 oz)] 81.4 kg (179 lb 7.3 oz) (09/03 0500) HEMODYNAMICS:   VENTILATOR SETTINGS: Vent Mode:  [-] PRVC FiO2 (%):  [100 %] 100 % Set Rate:  [14 bmp] 14 bmp Vt Set:  [620 mL] 620 mL PEEP:  [5 cmH20] 5 cmH20 Plateau Pressure:  [20 cmH20] 20 cmH20 INTAKE / OUTPUT:  Intake/Output Summary (Last 24 hours) at 04/11/15 0451 Last data filed at 04/11/15 0400  Gross per 24 hour  Intake 1439.13 ml  Output      0 ml  Net 1439.13 ml    PHYSICAL EXAMINATION: General:   No distress. Eyes open. Room filled with multiple medical personnel.  Integument:  Warm & dry. No rash on exposed skin. No bruising. Lymphatics:  No appreciated cervical or supraclavicular lymphadenoapthy. HEENT:  Moist mucus membranes. No scleral injection or icterus.  Cardiovascular:  Regular rate. No edema. No appreciable JVD.  Pulmonary:  Coarse breath sounds bilatearlly. Symmetric chest wall rise.  Abdomen: Soft. Normal bowel sounds. Nondistended.  Musculoskeletal:  Normal bulk and tone. No joint deformity or effusion  appreciated. Neurological:  PERRL. patient not spontaneously moving extremities.  LABS:  CBC  Recent Labs Lab 04/09/15 1004 04/10/15 0240 04/11/15 0137 04/11/15 0220  WBC 14.6* 13.6* 19.9*  --   HGB 9.6* 8.9* 8.6* 7.8*  HCT 30.0* 27.5* 26.9* 23.0*  PLT 163 150 171  --    Coag's  Recent Labs Lab 04/10/15 0230 04/11/15 0137  APTT  --  >200*  INR 1.37 1.98*   BMET  Recent Labs Lab 04/07/15 0047 04/09/15 1523 04/11/15 0137 04/11/15 0220  NA 140 135 134* 134*  K 4.3 4.4 4.9 4.4  CL 102 98* 97* 100*  CO2 26 21* 18*  --   BUN 40* 54* 53* 54*  CREATININE 9.40* 10.51* 10.78* 9.50*  GLUCOSE 98 77 114* 115*   Electrolytes  Recent Labs Lab 04/07/15 0047 04/09/15 1523 04/11/15 0137  CALCIUM 9.3 8.8* 8.8*  MG  --   --  2.7*  PHOS  --  6.5* 9.3*   Sepsis Markers  Recent Labs Lab 04/11/15 0213  PROCALCITON 2.56   ABG  Recent Labs Lab 04/11/15 0135 04/11/15 0218 04/11/15 0317  PHART 7.316* 7.210* 7.365  PCO2ART 33.1* 39.2 32.2*  PO2ART 82.0 85.0 83.0   Liver Enzymes  Recent Labs Lab 04/09/15 1523 04/11/15 0137  AST  --  316*  ALT  --  209*  ALKPHOS  --  63  BILITOT  --  1.0  ALBUMIN 2.5* 2.4*   Cardiac Enzymes No results  for input(s): TROPONINI, PROBNP in the last 168 hours. Glucose  Recent Labs Lab 04/04/15 1544 04/04/15 2019 04/05/15 0037 04/05/15 0354 04/05/15 1237 04/11/15 0406  GLUCAP 79 103* 98 88 96 130*    Imaging Dg Chest Port 1 View  04/11/2015   CLINICAL DATA:  Endotracheal tube placement. Aortic dissection. Initial encounter.  EXAM: PORTABLE CHEST - 1 VIEW  COMPARISON:  Chest radiograph performed 04/05/2015  FINDINGS: The patient's endotracheal tube is seen ending 4 cm above the carina. A right-sided dual-lumen catheter is noted ending about the distal SVC.  Retrocardiac airspace opacity likely reflects a combination of pleural fluid and airspace consolidation, as on prior studies. No pneumothorax is seen.  The  cardiomediastinal silhouette is enlarged. The patient is status post median sternotomy. No acute osseous abnormalities are seen. An external pacing pad is noted.  IMPRESSION: 1. Endotracheal tube seen ending 4 cm above the carina. 2. Retrocardiac airspace opacity likely reflects a combination of pleural fluid and airspace consolidation, as on prior studies.   Electronically Signed   By: Garald Balding M.D.   On: 04/11/2015 02:07     ASSESSMENT / PLAN:  PULMONARY OETT 9/4>> A: Acute Hypoxic Respiratory Failure Possible HCAP H/O OSA  P:   Vent Bundle See ID section   CARDIOVASCULAR R Tunneled IJ CVL A:  Type A Aortic Dissection Shock  P:  Heparin drip on hold pending head CT H/O HTN Trending cardiac biomarkers TTE Levophed Drip to maintain MAP >65  RENAL A:   ESRD - on HD  P:   Dialysis per nephrology Monitoring electrolytes daily  GASTROINTESTINAL A:   H/O GERD  P:   Protonix IV daily Nothing by mouth for now  HEMATOLOGIC A:   Anemia - no signs of active bleeding  P:  Trending hemoglobin daily with CBC  INFECTIOUS A:   Possible HCAP  P:   Procalcitonin algorithm Trending lactic acid Trending leukocytosis  BCx2 9/4>> Sputum 9/4>>  Abx:  Vancomycin, start date 9/4, day 1/x Aztreonam, start date 9/4, day 1/x  ENDOCRINE A:  No acute issues  P:   Monitor blood glucose daily on labs  NEUROLOGIC A:   Altered mental status Initially unequal pupils  P:   RASS goal: 0 to -1 Ativan IV prn Fentanyl IV prn CT head W/O   FAMILY  - Updates: Family have been contacted by nursing staff making them aware of the patient's critical status and decompensation.  - Inter-disciplinary family meet or Palliative Care meeting due by:  9/11   TODAY'S SUMMARY: Patient suffered cardiac arrest with agonal respirations and ultimately requiring endotracheal intubation this morning. He remained hypotensive after intubation lost a pulse again going into  ventricular fibrillation. Defibrillation was performed and patient was subsequently placed on amiodarone infusion. His status remains critical on a vasopressor infusion of norepinephrine. Family have been contacted by nursing staff and are in transit to the hospital.   I have spent a total of 39 minutes of critical care time today caring for the patient, being present for the patient's codes, & reviewing the patient's electronic medical record.  Sonia Baller Ashok Cordia, M.D. [facility] Pulmonary & Critical Care Pager:  [phone] After 3pm or if no response, call [phone] 04/11/2015, 4:51 AM

## 2015-04-11 NOTE — Plan of Care (Signed)
   MD would like to be notified after head CT to discuss restarting Heparin.

## 2015-04-11 NOTE — Progress Notes (Signed)
[facility] ROUNDING NOTE   Subjective:   Interval History:  Events noted :  cardiopulmonary arrest  Objective:  Vital signs in last 24 hours:  Temp:  [96.1 F (35.6 C)-98.6 F (37 C)] 96.1 F (35.6 C) (09/04 0400) Pulse Rate:  [71-94] 91 (09/04 0811) Resp:  [15-47] 15 (09/04 0811) BP: (58-135)/(12-92) 125/73 mmHg (09/04 0811) SpO2:  [69 %-100 %] 100 % (09/04 0811) Arterial Line BP: (106-132)/(57-76) 130/73 mmHg (09/04 0700) FiO2 (%):  [100 %] 100 % (09/04 0811)  Weight change:  Filed Weights   04/08/15 0615 04/09/15 1430 04/10/15 0500  Weight: 87.1 kg (192 lb 0.3 oz) 85.9 kg (189 lb 6 oz) 81.4 kg (179 lb 7.3 oz)    Intake/Output: I/O last 3 completed shifts: In: 2634.9 [P.O.:1020; I.V.:774.9; NG/GT:240; IV Piggyback:600] Out: 0    Intake/Output this shift:    Sedated and intubated CVS- RRR  distant RS- CTA intubated and rhonchi ABD- BS hypoactive EXT- no edema   Basic Metabolic Panel:  Recent Labs Lab 04/05/15 0300 04/07/15 0047 04/09/15 1523 04/11/15 0137 04/11/15 0220 04/11/15 0438  NA 140 140 135 134* 134* 134*  K 4.2 4.3 4.4 4.9 4.4 4.7  CL 103 102 98* 97* 100* 95*  CO2 23 26 21* 18*  --  17*  GLUCOSE 103* 98 77 114* 115* 136*  BUN 38* 40* 54* 53* 54* 52*  CREATININE 9.26* 9.40* 10.51* 10.78* 9.50* 10.28*  CALCIUM 8.8* 9.3 8.8* 8.8*  --  7.7*  MG  --   --   --  2.7*  --   --   PHOS  --   --  6.5* 9.3*  --  10.8*    Liver Function Tests:  Recent Labs Lab 04/09/15 1523 04/11/15 0137 04/11/15 0438  AST  --  316*  --   ALT  --  209*  --   ALKPHOS  --  63  --   BILITOT  --  1.0  --   PROT  --  5.6*  --   ALBUMIN 2.5* 2.4* 2.0*   No results for input(s): LIPASE, AMYLASE in the last 168 hours. No results for input(s): AMMONIA in the last 168 hours.  CBC:  Recent Labs Lab 04/05/15 0300 04/07/15 0047 04/09/15 1004 04/10/15 0240 04/11/15 0137 04/11/15 0220  WBC 11.6* 10.4 14.6* 13.6* 19.9*  --   NEUTROABS  --   --    --   --  16.8*  --   HGB 8.2* 8.4* 9.6* 8.9* 8.6* 7.8*  HCT 25.0* 26.4* 30.0* 27.5* 26.9* 23.0*  MCV 81.7 83.3 81.5 81.6 82.3  --   PLT 153 145* 163 150 171  --     Cardiac Enzymes:  Recent Labs Lab 04/11/15 0438  TROPONINI 0.42*    BNP: Invalid input(s): POCBNP  CBG:  Recent Labs Lab 04/05/15 0037 04/05/15 0354 04/05/15 1237 04/11/15 0108 04/11/15 0406  GLUCAP 98 95 96 126* 130*    Microbiology: Results for orders placed or performed during the hospital encounter of 03/16/2015  MRSA PCR Screening     Status: None   Collection Time: 03/22/2015  8:33 PM  Result Value Ref Range Status   MRSA by PCR NEGATIVE NEGATIVE Final    Comment:        The GeneXpert MRSA Assay (FDA approved for NASAL specimens only), is one component of a comprehensive MRSA colonization surveillance program. It is not intended to diagnose MRSA infection nor to guide or monitor treatment for MRSA  infections.   Surgical pcr screen     Status: None   Collection Time: 04/01/15  1:06 AM  Result Value Ref Range Status   MRSA, PCR NEGATIVE NEGATIVE Final   Staphylococcus aureus NEGATIVE NEGATIVE Final    Comment:        The Xpert SA Assay (FDA approved for NASAL specimens in patients over 29 years of age), is one component of a comprehensive surveillance program.  Test performance has been validated by [facility] for patients greater than or equal to 66 year old. It is not intended to diagnose infection nor to guide or monitor treatment.     Coagulation Studies:  Recent Labs  04/10/15 0230 04/11/15 0137  LABPROT 17.0* 22.4*  INR 1.37 1.98*    Urinalysis: No results for input(s): COLORURINE, LABSPEC, PHURINE, GLUCOSEU, HGBUR, BILIRUBINUR, KETONESUR, PROTEINUR, UROBILINOGEN, NITRITE, LEUKOCYTESUR in the last 72 hours.  Invalid input(s): APPERANCEUR    Imaging: Dg Chest Port 1 View  04/11/2015   CLINICAL DATA:  Status post ascending aortic dissection repair -postop day 11.  Cardiac arrest this morning.  EXAM: PORTABLE CHEST - 1 VIEW  COMPARISON:  04/11/2015 and prior radiograph  FINDINGS: Cardiomegaly and median sternotomy wires again identified.  An endotracheal tube with tip 6 cm above the carina, NG tube entering the stomach with tip off the field of view, right IJ central venous catheter with tips overlying the mid and lower SVC again noted.  Left lower lung consolidation/atelectasis and new right basilar atelectasis/pleural effusion noted.  There is no evidence of pneumothorax.  No acute bony abnormalities are identified.  IMPRESSION: New right basilar atelectasis/ pleural effusion. Unchanged left lower lung consolidation/atelectasis.  Cardiomegaly and support apparatus apparatus as described.   Electronically Signed   By: Margarette Canada M.D.   On: 04/11/2015 10:30   Dg Chest Port 1 View  04/11/2015   CLINICAL DATA:  Endotracheal tube placement. Aortic dissection. Initial encounter.  EXAM: PORTABLE CHEST - 1 VIEW  COMPARISON:  Chest radiograph performed 04/05/2015  FINDINGS: The patient's endotracheal tube is seen ending 4 cm above the carina. A right-sided dual-lumen catheter is noted ending about the distal SVC.  Retrocardiac airspace opacity likely reflects a combination of pleural fluid and airspace consolidation, as on prior studies. No pneumothorax is seen.  The cardiomediastinal silhouette is enlarged. The patient is status post median sternotomy. No acute osseous abnormalities are seen. An external pacing pad is noted.  IMPRESSION: 1. Endotracheal tube seen ending 4 cm above the carina. 2. Retrocardiac airspace opacity likely reflects a combination of pleural fluid and airspace consolidation, as on prior studies.   Electronically Signed   By: Garald Balding M.D.   On: 04/11/2015 02:07   Ct Portable Head W/o Cm  04/11/2015   CLINICAL DATA:  Acute encephalopathy.  EXAM: CT HEAD WITHOUT CONTRAST  TECHNIQUE: Contiguous axial images were obtained from the base of the skull  through the vertex without intravenous contrast.  COMPARISON:  None.  FINDINGS: Mobile images are motion degraded. There is mild central and cortical atrophy. Periventricular white matter changes are consistent with small vessel disease. Endotracheal tube is in place. There is dense atherosclerotic calcification of the left common carotid artery and bilateral internal carotid arteries. No acute fracture.  IMPRESSION: 1. Atrophy and small vessel disease. 2.  No evidence for acute intracranial abnormality.   Electronically Signed   By: Nolon Nations M.D.   On: 04/11/2015 09:21     Medications:   . sodium chloride    .  sodium chloride Stopped (04/07/15 1000)  . amiodarone 60 mg/hr (04/11/15 0130)  . heparin Stopped (04/11/15 0100)  . norepinephrine (LEVOPHED) Adult infusion Stopped (04/11/15 1025)  . vasopressin (PITRESSIN) infusion - *FOR SHOCK* 0.03 Units/min (04/11/15 0340)   . aspirin EC  325 mg Oral Daily  . aztreonam  500 mg Intravenous Q12H  . calcitRIOL  0.5 mcg Per Tube Daily  . darbepoetin (ARANESP) injection - NON-DIALYSIS  100 mcg Subcutaneous Q Sat-1800  . ferric gluconate (FERRLECIT/NULECIT) IV  125 mg Intravenous Q T,Th,Sa-HD  . multivitamin  1 tablet Oral QHS  . pantoprazole  40 mg Oral Daily  . sodium chloride  3 mL Intravenous Q12H   Place/Maintain arterial line **AND** sodium chloride, acetaminophen, fentaNYL (SUBLIMAZE) injection, LORazepam, ondansetron (ZOFRAN) IV, sodium chloride  Assessment/ Plan:  11  Days Post-Op Procedure(s) (LRB): REPAIR OF ASCENDING AORTIC DISSECTION, HYPOTHERMIC CIRCULATORY ARREST, RIGHT AXILLARY CANNULATION (N/A)  1. ESRD ---- usual TTS Although now off schedule K 4.7  2. Anemia -- Hb 7.8 started aranesp  3. Bones --- Ca 7.7 Calcitriol   ( albumin 2 )  4. HTN -- Numerous BP meds With controlled BP  Events noted leading to arrest: CXR -  Pleural fluid   Head CT no intracranial abnormality    Resuscitation with ROSC :  critically ill now on pressors. No indications for dialysis     LOS: 11 Donney Caraveo W @TODAY @10 :39 AM

## 2015-04-11 NOTE — Progress Notes (Signed)
11 Days Post-Op Procedure(s) (LRB): REPAIR OF ASCENDING AORTIC DISSECTION, HYPOTHERMIC CIRCULATORY ARREST, RIGHT AXILLARY CANNULATION (N/A) Subjective: Intubated,sedated  Objective: Vital signs in last 24 hours: Temp:  [96.1 F (35.6 C)-98.6 F (37 C)] 96.1 F (35.6 C) (09/04 0400) Pulse Rate:  [71-94] 91 (09/04 0811) Cardiac Rhythm:  [-] Atrial fibrillation;A-V Sequential paced (09/04 0100) Resp:  [15-47] 15 (09/04 0811) BP: (58-135)/(12-92) 125/73 mmHg (09/04 0811) SpO2:  [69 %-100 %] 100 % (09/04 0811) Arterial Line BP: (106-132)/(57-76) 130/73 mmHg (09/04 0700) FiO2 (%):  [100 %] 100 % (09/04 0811)  Hemodynamic parameters for last 24 hours:    Intake/Output from previous day: 09/03 0701 - 09/04 0700 In: 2090.5 [P.O.:660; I.V.:590.5; NG/GT:240; IV Piggyback:600] Out: 0  Intake/Output this shift:    Neurologic: unresponsive Heart: regular rate and rhythm Lungs: rhonchi bilaterally Abdomen: soft nondistended Wound: some bloody drainage  Lab Results:  Recent Labs  04/10/15 0240 04/11/15 0137 04/11/15 0220  WBC 13.6* 19.9*  --   HGB 8.9* 8.6* 7.8*  HCT 27.5* 26.9* 23.0*  PLT 150 171  --    BMET:  Recent Labs  04/11/15 0137 04/11/15 0220 04/11/15 0438  NA 134* 134* 134*  K 4.9 4.4 4.7  CL 97* 100* 95*  CO2 18*  --  17*  GLUCOSE 114* 115* 136*  BUN 53* 54* 52*  CREATININE 10.78* 9.50* 10.28*  CALCIUM 8.8*  --  7.7*    PT/INR:  Recent Labs  04/11/15 0137  LABPROT 22.4*  INR 1.98*   ABG    Component Value Date/Time   PHART 7.365 04/11/2015 0317   HCO3 18.4* 04/11/2015 0317   TCO2 19 04/11/2015 0317   ACIDBASEDEF 6.0* 04/11/2015 0317   O2SAT 96.0 04/11/2015 0317   CBG (last 3)   Recent Labs  04/11/15 0406  GLUCAP 130*    Assessment/Plan: S/P Procedure(s) (LRB): REPAIR OF ASCENDING AORTIC DISSECTION, HYPOTHERMIC CIRCULATORY ARREST, RIGHT AXILLARY CANNULATION (N/A) -  CV- currently paced, in SB under  On vasopressin at 0.03, able to  wean levophed a little overnight   NEURO- pupils still reactive, having portable head CT now to rule out stroke  RESP- VDRF- etiology still not entirely clear  Vent per CCM  RENAL- ESRD, metabolic acidosis improved  ID- started on vanco and aztreonam empirically  Aspiration +/- pneumonia are possible  GI- his abdomen is soft and nondistended on exam  Remains critically ill. Underlying source of events from last night are still not determined.    LOS: 11 days    Melrose Nakayama 04/11/2015

## 2015-04-11 NOTE — Progress Notes (Addendum)
ANTICOAGULATION CONSULT NOTE - Follow Up Consult  Pharmacy Consult for heparin Indication: RUE DVT  Allergies  Allergen Reactions  . Penicillins Itching and Rash   Patient Measurements: Height: 6' (182.9 cm) Weight: 179 lb 7.3 oz (81.4 kg) IBW/kg (Calculated) : 77.6  Vital Signs: Temp: 96.1 F (35.6 C) (09/04 0400) Temp Source: Oral (09/04 0400) BP: 125/73 mmHg (09/04 0811) Pulse Rate: 91 (09/04 0811)  Labs:  Recent Labs  04/09/15 1004  04/10/15 0230 04/10/15 0240 04/10/15 1210 04/10/15 2225 04/11/15 0137 04/11/15 0220 04/11/15 0438 04/11/15 0441  HGB 9.6*  --   --  8.9*  --   --  8.6* 7.8*  --   --   HCT 30.0*  --   --  27.5*  --   --  26.9* 23.0*  --   --   PLT 163  --   --  150  --   --  171  --   --   --   APTT  --   --   --   --   --   --  >200*  --   --   --   LABPROT  --   --  17.0*  --   --   --  22.4*  --   --   --   INR  --   --  1.37  --   --   --  1.98*  --   --   --   HEPARINUNFRC 0.63  --  0.82*  --  0.81* 0.92*  --   --   --  1.60*  CREATININE  --   < >  --   --   --   --  10.78* 9.50* 10.28*  --   TROPONINI  --   --   --   --   --   --   --   --  0.42*  --   < > = values in this interval not displayed. Estimated Creatinine Clearance: 8.1 mL/min (by C-G formula based on Cr of 10.28).  Medication: Infusion: . sodium chloride    . sodium chloride Stopped (04/07/15 1000)  . amiodarone 60 mg/hr (04/11/15 0130)  . heparin Stopped (04/11/15 0100)  . norepinephrine (LEVOPHED) Adult infusion Stopped (04/11/15 1025)  . vasopressin (PITRESSIN) infusion - *FOR SHOCK* 0.03 Units/min (04/11/15 0340)    Assessment: 64 y/o male s/p repair of ascending aortic dissection on 8/25 who was started on a heparin drip bridging to warfarin for RUE DVT. He has had access issues and previously heparin drip was infusing through HD cath. He cardiopulmonary arrested early this morning and heparin drip was stopped to r/o stroke. CT head was negative. Spoke with Dr.  Roxan Hockey and ok to resume heparin.  Unfortunately he currently has no access for heparin drip currently as he is receiving pressors and albumin per RN. She will notify pharmacy when heparin drip is resumed so a level can be timed. Hb has trended down to 7.8, platelets are normal. INR up to 1.98 today.  Goal of Therapy:  Heparin level 0.3-0.7 units/ml Monitor platelets by anticoagulation protocol: Yes   Plan:  -Heparin drip at 1000 units/hr when access available, RN to notify pharmacy -8 hr heparin level once resumed -Daily heparin level, CBC, and INR -Monitor for s/sx bleeding -Please notify pharmacy when able to resume warfarin  [facility], Pharm.D., BCPS Clinical Pharmacist Pager: [phone] 04/11/2015 11:11 AM   Addendum: Bonne Dolores to call RN but unavailable.  Heparin resumed at 1000 units/hr at 15:51. Will check 8 hr heparin level.  [place], Pharm.D., BCPS Clinical Pharmacist Pager: [phone] 04/11/2015 4:32 PM

## 2015-04-11 NOTE — Progress Notes (Signed)
      [address]       [place] [address]             [phone]      CTSP for respiratory/ cardiac arrest  Matthew Kane was found obtunded at 0100 this AM. CPR performed and he was intubated. He did have ROSC. He was initially hypertensive.  On my arrival Dr. Ashok Cordia of CCM was present. The patient was bradycardic and hypotensive. Given epinephrine but remained hypotensive His rhythm deteriorated to VT/ VF and he was shocked and CPR begun. Epi and amiodarone given. On 3rd shock he had ROSC with a bradycardic rhythm. DDD paced at 55.  Currently intubated, sedated, DDD @ 90 and on levophed at 25 mcg/min ABG 7.21/39/85/ -11- given 2 more amps of bicarb Pupils are R 4 to 2/ L 3 to 2.  Etiology of arrest unclear. He was agitated prior to the event and got 5 mg of oxycodone. Could be pneumonia or aspiration. Will cover with antibiotics. Intraabdominal source cannot be ruled out at present. Less likely primary cardiac event, but will cycle enzymes. Doubt PE as fully anticoagulated.  CCM consulted. Family notified.  Matthew Standard Roxan Hockey, MD [facility] [phone]

## 2015-04-11 NOTE — Progress Notes (Signed)
ANTIBIOTIC CONSULT NOTE - INITIAL  Pharmacy Consult for Vancomycin and Aztreonam Indication: rule out sepsis  Allergies  Allergen Reactions  . Penicillins Itching and Rash    Patient Measurements: Height: 6' (182.9 cm) Weight: 179 lb 7.3 oz (81.4 kg) IBW/kg (Calculated) : 77.6  Vital Signs: Temp: 98.3 F (36.8 C) (09/03 2000) Temp Source: Oral (09/03 2000) BP: 106/12 mmHg (09/03 2200) Pulse Rate: 93 (09/03 2200) Intake/Output from previous day: 09/03 0701 - 09/04 0700 In: 1097 [P.O.:660; I.V.:197; NG/GT:240] Out: 0  Intake/Output from this shift: Total I/O In: 37.5 [I.V.:37.5] Out: -   Labs:  Recent Labs  04/09/15 1004 04/09/15 1523 04/10/15 0240 04/11/15 0137  WBC 14.6*  --  13.6* 19.9*  HGB 9.6*  --  8.9* 8.6*  PLT 163  --  150 171  CREATININE  --  10.51*  --   --    Estimated Creatinine Clearance: 7.9 mL/min (by C-G formula based on Cr of 10.51). No results for input(s): VANCOTROUGH, VANCOPEAK, VANCORANDOM, GENTTROUGH, GENTPEAK, GENTRANDOM, TOBRATROUGH, TOBRAPEAK, TOBRARND, AMIKACINPEAK, AMIKACINTROU, AMIKACIN in the last 72 hours.   Microbiology: Recent Results (from the past 720 hour(s))  MRSA PCR Screening     Status: None   Collection Time: 03/27/2015  8:33 PM  Result Value Ref Range Status   MRSA by PCR NEGATIVE NEGATIVE Final    Comment:        The GeneXpert MRSA Assay (FDA approved for NASAL specimens only), is one component of a comprehensive MRSA colonization surveillance program. It is not intended to diagnose MRSA infection nor to guide or monitor treatment for MRSA infections.   Surgical pcr screen     Status: None   Collection Time: 04/01/15  1:06 AM  Result Value Ref Range Status   MRSA, PCR NEGATIVE NEGATIVE Final   Staphylococcus aureus NEGATIVE NEGATIVE Final    Comment:        The Xpert SA Assay (FDA approved for NASAL specimens in patients over 41 years of age), is one component of a comprehensive surveillance program.   Test performance has been validated by [facility] for patients greater than or equal to 52 year old. It is not intended to diagnose infection nor to guide or monitor treatment.    Medications:  Scheduled:  . amiodarone      . aspirin EC  325 mg Oral Daily  . calcitRIOL  0.5 mcg Per Tube Daily  . darbepoetin (ARANESP) injection - NON-DIALYSIS  100 mcg Subcutaneous Q Sat-1800  . ferric gluconate (FERRLECIT/NULECIT) IV  125 mg Intravenous Q T,Th,Sa-HD  . multivitamin  1 tablet Oral QHS  . pantoprazole  40 mg Oral Daily  . sodium chloride  3 mL Intravenous Q12H   Assessment: 64 y.o. male s/p repair ascending aortic dissection 8/25, now with AMS/VDRF, possible sepsis, for empiric antibiotics   Goal of Therapy:  Vancomycin pre-HD level 15-25  Plan:  Vancomycin 1500 mg IV now, then 750 mg after each HD Aztreonam 500 mg IV q12h  Caryl Pina 04/11/2015,2:24 AM

## 2015-04-11 NOTE — Progress Notes (Signed)
      [address]       [place] [address]             [phone]      Intubated. He has woken up and followed commands  BP 116/66 mmHg  Pulse 91  Temp(Src) 97 F (36.1 C) (Axillary)  Resp 16  Ht 6' (1.829 m)  Wt 179 lb 7.3 oz (81.4 kg)  BMI 24.33 kg/m2  SpO2 98%  On vasopressin at 0.03, levophed at 15   Intake/Output Summary (Last 24 hours) at 04/11/15 1802 Last data filed at 04/11/15 1232  Gross per 24 hour  Intake 1243.5 ml  Output      0 ml  Net 1243.5 ml    He remains critically ill with sepsis  No clear source identified, but pneumonia suspected He has had diarrhea so intraabdominal source is possible Flagyl added in case c diff. On Vanco and aztreonam already CT chest abdomen, pelvis done hasn't been read yet  Remo Lipps C. Roxan Hockey, MD [facility] [phone]

## 2015-04-12 ENCOUNTER — Inpatient Hospital Stay (HOSPITAL_COMMUNITY): Payer: Medicare HMO

## 2015-04-12 DIAGNOSIS — I469 Cardiac arrest, cause unspecified: Secondary | ICD-10-CM | POA: Insufficient documentation

## 2015-04-12 DIAGNOSIS — I10 Essential (primary) hypertension: Secondary | ICD-10-CM

## 2015-04-12 DIAGNOSIS — R578 Other shock: Secondary | ICD-10-CM

## 2015-04-12 LAB — BASIC METABOLIC PANEL WITH GFR
Anion gap: 33 — ABNORMAL HIGH (ref 5–15)
BUN: 68 mg/dL — ABNORMAL HIGH (ref 6–20)
CO2: 7 mmol/L — ABNORMAL LOW (ref 22–32)
Calcium: 9.4 mg/dL (ref 8.9–10.3)
Chloride: 96 mmol/L — ABNORMAL LOW (ref 101–111)
Creatinine, Ser: 12.03 mg/dL — ABNORMAL HIGH (ref 0.61–1.24)
GFR calc Af Amer: 4 mL/min — ABNORMAL LOW
GFR calc non Af Amer: 4 mL/min — ABNORMAL LOW
Glucose, Bld: 182 mg/dL — ABNORMAL HIGH (ref 65–99)
Potassium: 6.4 mmol/L (ref 3.5–5.1)
Sodium: 136 mmol/L (ref 135–145)

## 2015-04-12 LAB — CARBOXYHEMOGLOBIN
CARBOXYHEMOGLOBIN: 0.4 % — AB (ref 0.5–1.5)
Methemoglobin: 1.6 % — ABNORMAL HIGH (ref 0.0–1.5)
O2 SAT: 47.3 %
TOTAL HEMOGLOBIN: 6.9 g/dL — AB (ref 13.5–18.0)

## 2015-04-12 LAB — HEPARIN LEVEL (UNFRACTIONATED)
HEPARIN UNFRACTIONATED: 0.12 [IU]/mL — AB (ref 0.30–0.70)
Heparin Unfractionated: <0.1 IU/mL — ABNORMAL LOW (ref 0.30–0.70)

## 2015-04-12 LAB — BASIC METABOLIC PANEL
ANION GAP: 24 — AB (ref 5–15)
BUN: 68 mg/dL — AB (ref 6–20)
CALCIUM: 7.8 mg/dL — AB (ref 8.9–10.3)
CO2: 15 mmol/L — ABNORMAL LOW (ref 22–32)
CREATININE: 11.61 mg/dL — AB (ref 0.61–1.24)
Chloride: 95 mmol/L — ABNORMAL LOW (ref 101–111)
GFR calc Af Amer: 5 mL/min — ABNORMAL LOW (ref >60)
GFR, EST NON AFRICAN AMERICAN: 4 mL/min — AB (ref >60)
GLUCOSE: 47 mg/dL — AB (ref 65–99)
Potassium: 6.3 mmol/L (ref 3.5–5.1)
Sodium: 134 mmol/L — ABNORMAL LOW (ref 135–145)

## 2015-04-12 LAB — RENAL FUNCTION PANEL
ANION GAP: 33 — AB (ref 5–15)
Albumin: 1.7 g/dL — ABNORMAL LOW (ref 3.5–5.0)
BUN: 68 mg/dL — ABNORMAL HIGH (ref 6–20)
CALCIUM: 9.5 mg/dL (ref 8.9–10.3)
CHLORIDE: 96 mmol/L — AB (ref 101–111)
CO2: 7 mmol/L — AB (ref 22–32)
Creatinine, Ser: 11.86 mg/dL — ABNORMAL HIGH (ref 0.61–1.24)
GFR calc non Af Amer: 4 mL/min — ABNORMAL LOW (ref >60)
GFR, EST AFRICAN AMERICAN: 5 mL/min — AB (ref >60)
GLUCOSE: 186 mg/dL — AB (ref 65–99)
Phosphorus: 13.5 mg/dL — ABNORMAL HIGH (ref 2.5–4.6)
Potassium: 6.4 mmol/L (ref 3.5–5.1)
SODIUM: 136 mmol/L (ref 135–145)

## 2015-04-12 LAB — POCT I-STAT 3, VENOUS BLOOD GAS (G3P V)
Acid-base deficit: 20 mmol/L — ABNORMAL HIGH (ref 0.0–2.0)
Bicarbonate: 6.3 meq/L — ABNORMAL LOW (ref 20.0–24.0)
O2 Saturation: 83 %
Patient temperature: 96.2
TCO2: 7 mmol/L (ref 0–100)
pCO2, Ven: 16.2 mmHg — ABNORMAL LOW (ref 45.0–50.0)
pH, Ven: 7.191 — CL (ref 7.250–7.300)
pO2, Ven: 53 mmHg — ABNORMAL HIGH (ref 30.0–45.0)

## 2015-04-12 LAB — POCT I-STAT, CHEM 8
BUN: 70 mg/dL — AB (ref 6–20)
CREATININE: 11.6 mg/dL — AB (ref 0.61–1.24)
Calcium, Ion: 0.91 mmol/L — ABNORMAL LOW (ref 1.13–1.30)
Chloride: 99 mmol/L — ABNORMAL LOW (ref 101–111)
GLUCOSE: 90 mg/dL (ref 65–99)
HEMATOCRIT: 18 % — AB (ref 39.0–52.0)
HEMOGLOBIN: 6.1 g/dL — AB (ref 13.0–17.0)
Potassium: 6.6 mmol/L (ref 3.5–5.1)
Sodium: 135 mmol/L (ref 135–145)
TCO2: 11 mmol/L (ref 0–100)

## 2015-04-12 LAB — BLOOD GAS, ARTERIAL
ACID-BASE DEFICIT: 10.4 mmol/L — AB (ref 0.0–2.0)
Bicarbonate: 14.9 mEq/L — ABNORMAL LOW (ref 20.0–24.0)
DRAWN BY: 236041
FIO2: 0.6
MECHVT: 620 mL
O2 SAT: 94.3 %
PATIENT TEMPERATURE: 98.6
PCO2 ART: 32.5 mmHg — AB (ref 35.0–45.0)
PEEP: 10 cmH2O
PH ART: 7.284 — AB (ref 7.350–7.450)
PO2 ART: 92.8 mmHg (ref 80.0–100.0)
RATE: 14 resp/min
TCO2: 15.9 mmol/L (ref 0–100)

## 2015-04-12 LAB — HEPATIC FUNCTION PANEL
ALK PHOS: 119 U/L (ref 38–126)
ALT: 4218 U/L — AB (ref 17–63)
AST: 10647 U/L — ABNORMAL HIGH (ref 15–41)
Albumin: 2.2 g/dL — ABNORMAL LOW (ref 3.5–5.0)
BILIRUBIN DIRECT: 1.2 mg/dL — AB (ref 0.1–0.5)
BILIRUBIN INDIRECT: 1 mg/dL — AB (ref 0.3–0.9)
BILIRUBIN TOTAL: 2.2 mg/dL — AB (ref 0.3–1.2)
TOTAL PROTEIN: 4.4 g/dL — AB (ref 6.5–8.1)

## 2015-04-12 LAB — POCT I-STAT 3, ART BLOOD GAS (G3+)
ACID-BASE DEFICIT: 6 mmol/L — AB (ref 0.0–2.0)
ACID-BASE DEFICIT: 19 mmol/L — AB (ref 0.0–2.0)
BICARBONATE: 9.9 meq/L — AB (ref 20.0–24.0)
Bicarbonate: 18.6 mEq/L — ABNORMAL LOW (ref 20.0–24.0)
O2 SAT: 95 %
O2 Saturation: 99 %
PH ART: 7.354 (ref 7.350–7.450)
PO2 ART: 106 mmHg — AB (ref 80.0–100.0)
TCO2: 20 mmol/L (ref 0–100)
TCO2: 11 mmol/L (ref 0–100)
pCO2 arterial: 33.3 mmHg — ABNORMAL LOW (ref 35.0–45.0)
pCO2 arterial: 36.4 mmHg (ref 35.0–45.0)
pH, Arterial: 7.044 — CL (ref 7.350–7.450)
pO2, Arterial: 124 mmHg — ABNORMAL HIGH (ref 80.0–100.0)

## 2015-04-12 LAB — GLUCOSE, CAPILLARY
GLUCOSE-CAPILLARY: 41 mg/dL — AB (ref 65–99)
GLUCOSE-CAPILLARY: 83 mg/dL (ref 65–99)
GLUCOSE-CAPILLARY: 56 mg/dL — AB (ref 65–99)
GLUCOSE-CAPILLARY: 100 mg/dL — AB (ref 65–99)
Glucose-Capillary: 65 mg/dL (ref 65–99)
Glucose-Capillary: 107 mg/dL — ABNORMAL HIGH (ref 65–99)
Glucose-Capillary: 69 mg/dL (ref 65–99)
Glucose-Capillary: 77 mg/dL (ref 65–99)

## 2015-04-12 LAB — PREPARE RBC (CROSSMATCH)

## 2015-04-12 LAB — CBC
HCT: 18.3 % — ABNORMAL LOW (ref 39.0–52.0)
HEMATOCRIT: 21.9 % — AB (ref 39.0–52.0)
HEMOGLOBIN: 5.7 g/dL — AB (ref 13.0–17.0)
Hemoglobin: 7.1 g/dL — ABNORMAL LOW (ref 13.0–17.0)
MCH: 26.5 pg (ref 26.0–34.0)
MCH: 26.8 pg (ref 26.0–34.0)
MCHC: 32.4 g/dL (ref 30.0–36.0)
MCHC: 31.1 g/dL (ref 30.0–36.0)
MCV: 81.7 fL (ref 78.0–100.0)
MCV: 85.9 fL (ref 78.0–100.0)
PLATELETS: 190 10*3/uL (ref 150–400)
Platelets: 21 10*3/uL — CL (ref 150–400)
RBC: 2.68 MIL/uL — ABNORMAL LOW (ref 4.22–5.81)
RBC: 2.13 MIL/uL — ABNORMAL LOW (ref 4.22–5.81)
RDW: 17.9 % — AB (ref 11.5–15.5)
RDW: 16.5 % — AB (ref 11.5–15.5)
WBC: 37.1 10*3/uL — AB (ref 4.0–10.5)
WBC: 20.9 10*3/uL — ABNORMAL HIGH (ref 4.0–10.5)

## 2015-04-12 LAB — CORTISOL: Cortisol, Plasma: 24.3 ug/dL

## 2015-04-12 LAB — PROTIME-INR
INR: 5.32 — AB (ref 0.00–1.49)
PROTHROMBIN TIME: 47.5 s — AB (ref 11.6–15.2)

## 2015-04-12 LAB — MAGNESIUM: Magnesium: 2.3 mg/dL (ref 1.7–2.4)

## 2015-04-12 LAB — PHOSPHORUS: Phosphorus: 11.4 mg/dL — ABNORMAL HIGH (ref 2.5–4.6)

## 2015-04-12 LAB — PROCALCITONIN: PROCALCITONIN: 42.02 ng/mL

## 2015-04-12 LAB — ALBUMIN: Albumin: 2.3 g/dL — ABNORMAL LOW (ref 3.5–5.0)

## 2015-04-12 MED ORDER — SODIUM BICARBONATE 8.4 % IV SOLN
50.0000 meq | Freq: Once | INTRAVENOUS | Status: AC
  Administered 2015-04-12: 50 meq via INTRAVENOUS

## 2015-04-12 MED ORDER — ALBUMIN HUMAN 5 % IV SOLN
INTRAVENOUS | Status: AC
  Administered 2015-04-12: 12.5 g
  Filled 2015-04-12: qty 250

## 2015-04-12 MED ORDER — DEXTROSE 50 % IV SOLN
INTRAVENOUS | Status: AC
  Administered 2015-04-12: 50 mL
  Filled 2015-04-12: qty 50

## 2015-04-12 MED ORDER — SODIUM CHLORIDE 0.9 % IV SOLN: Freq: Once | INTRAVENOUS | Status: DC

## 2015-04-12 MED ORDER — PRO-STAT SUGAR FREE PO LIQD
60.0000 mL | Freq: Three times a day (TID) | ORAL | Status: DC
  Administered 2015-04-12: 60 mL
  Filled 2015-04-12 (×6): qty 60

## 2015-04-12 MED ORDER — HYDROCORTISONE NA SUCCINATE PF 100 MG IJ SOLR
50.0000 mg | Freq: Four times a day (QID) | INTRAMUSCULAR | Status: DC
  Administered 2015-04-12 (×2): 50 mg via INTRAVENOUS
  Filled 2015-04-12 (×9): qty 1

## 2015-04-12 MED ORDER — ALTEPLASE 2 MG IJ SOLR
4.0000 mg | Freq: Once | INTRAMUSCULAR | Status: DC | PRN
  Administered 2015-04-12: 4 mg
  Filled 2015-04-12 (×2): qty 4

## 2015-04-12 MED ORDER — SODIUM POLYSTYRENE SULFONATE 15 GM/60ML PO SUSP
30.0000 g | Freq: Once | ORAL | Status: AC
  Administered 2015-04-12: 30 g via ORAL
  Filled 2015-04-12: qty 120

## 2015-04-12 MED ORDER — DOPAMINE-DEXTROSE 3.2-5 MG/ML-% IV SOLN
INTRAVENOUS | Status: AC
  Administered 2015-04-12: 06:00:00
  Filled 2015-04-12: qty 250

## 2015-04-12 MED ORDER — SODIUM BICARBONATE 8.4 % IV SOLN
INTRAVENOUS | Status: AC
  Filled 2015-04-12: qty 100

## 2015-04-12 MED ORDER — INSULIN ASPART 100 UNIT/ML IV SOLN
10.0000 [IU] | Freq: Once | INTRAVENOUS | Status: AC
  Administered 2015-04-12: 10 [IU] via INTRAVENOUS

## 2015-04-12 MED ORDER — PRISMASOL BGK 4/2.5 32-4-2.5 MEQ/L IV SOLN
INTRAVENOUS | Status: DC
  Administered 2015-04-12: 14:00:00 via INTRAVENOUS_CENTRAL
  Filled 2015-04-12 (×4): qty 5000

## 2015-04-12 MED ORDER — SODIUM BICARBONATE 8.4 % IV SOLN
INTRAVENOUS | Status: DC
  Administered 2015-04-12 – 2015-04-13 (×4): via INTRAVENOUS
  Filled 2015-04-12 (×7): qty 150

## 2015-04-12 MED ORDER — DEXTROSE 50 % IV SOLN
25.0000 mL | Freq: Once | INTRAVENOUS | Status: AC
  Administered 2015-04-12: 25 mL via INTRAVENOUS

## 2015-04-12 MED ORDER — CALCITRIOL 1 MCG/ML PO SOLN
0.5000 ug | Freq: Every day | ORAL | Status: DC
  Administered 2015-04-12: 0.5 ug via ORAL
  Filled 2015-04-12 (×2): qty 0.5

## 2015-04-12 MED ORDER — DEXTROSE 5 % IV SOLN
0.5000 ug/min | INTRAVENOUS | Status: DC
  Administered 2015-04-12: 30 ug/min via INTRAVENOUS
  Administered 2015-04-12: 5 ug/min via INTRAVENOUS
  Administered 2015-04-12 – 2015-04-13 (×2): 30 ug/min via INTRAVENOUS
  Filled 2015-04-12 (×5): qty 4

## 2015-04-12 MED ORDER — PRISMASOL BGK 0/2.5 32-2.5 MEQ/L IV SOLN
INTRAVENOUS | Status: DC
  Administered 2015-04-12 (×2): via INTRAVENOUS_CENTRAL
  Filled 2015-04-12 (×8): qty 5000

## 2015-04-12 MED ORDER — SODIUM BICARBONATE 8.4 % IV SOLN
50.0000 meq | Freq: Once | INTRAVENOUS | Status: AC
  Administered 2015-04-12: 50 meq via INTRAVENOUS
  Filled 2015-04-12: qty 50

## 2015-04-12 MED ORDER — DEXTROSE-NACL 5-0.9 % IV SOLN
INTRAVENOUS | Status: DC
  Administered 2015-04-12: 06:00:00 via INTRAVENOUS

## 2015-04-12 MED ORDER — ALTEPLASE 2 MG IJ SOLR
2.0000 mg | Freq: Once | INTRAMUSCULAR | Status: DC | PRN
  Filled 2015-04-12 (×3): qty 2

## 2015-04-12 MED ORDER — CALCIUM CHLORIDE 10 % IV SOLN
1.0000 g | Freq: Once | INTRAVENOUS | Status: AC
  Administered 2015-04-12: 1 g via INTRAVENOUS

## 2015-04-12 MED ORDER — DEXTROSE 5 % IV SOLN
2.0000 g | Freq: Two times a day (BID) | INTRAVENOUS | Status: DC
  Administered 2015-04-12: 2 g via INTRAVENOUS
  Filled 2015-04-12 (×2): qty 2

## 2015-04-12 MED ORDER — SODIUM BICARBONATE 8.4 % IV SOLN
INTRAVENOUS | Status: AC
Start: 2015-04-12 — End: 2015-04-12
  Filled 2015-04-12: qty 50

## 2015-04-12 MED ORDER — PANTOPRAZOLE SODIUM 40 MG IV SOLR
40.0000 mg | Freq: Every day | INTRAVENOUS | Status: DC
  Administered 2015-04-12: 40 mg via INTRAVENOUS
  Filled 2015-04-12 (×2): qty 40

## 2015-04-12 MED ORDER — ALBUMIN HUMAN 5 % IV SOLN: 12.5000 g | Freq: Once | INTRAVENOUS | Status: AC

## 2015-04-12 MED ORDER — SODIUM BICARBONATE 8.4 % IV SOLN
INTRAVENOUS | Status: AC
  Filled 2015-04-12: qty 50

## 2015-04-12 MED ORDER — VANCOMYCIN HCL IN DEXTROSE 1-5 GM/200ML-% IV SOLN
1000.0000 mg | INTRAVENOUS | Status: DC
  Administered 2015-04-12: 1000 mg via INTRAVENOUS
  Filled 2015-04-12: qty 200

## 2015-04-12 MED ORDER — DEXTROSE 50 % IV SOLN
1.0000 | Freq: Once | INTRAVENOUS | Status: AC
  Administered 2015-04-12: 50 mL via INTRAVENOUS
  Filled 2015-04-12: qty 50

## 2015-04-12 MED ORDER — HEPARIN SODIUM (PORCINE) 1000 UNIT/ML DIALYSIS
1000.0000 [IU] | INTRAMUSCULAR | Status: DC | PRN
  Filled 2015-04-12: qty 6

## 2015-04-12 MED ORDER — ASPIRIN 81 MG PO CHEW
324.0000 mg | CHEWABLE_TABLET | Freq: Every day | ORAL | Status: DC
  Administered 2015-04-12: 324 mg
  Filled 2015-04-12: qty 4

## 2015-04-12 MED ORDER — SODIUM CHLORIDE 0.9 % IV SOLN
Freq: Once | INTRAVENOUS | Status: AC
  Administered 2015-04-12: 08:00:00 via INTRAVENOUS

## 2015-04-12 MED ORDER — SODIUM CHLORIDE 0.9 % FOR CRRT
INTRAVENOUS_CENTRAL | Status: DC | PRN
  Filled 2015-04-12: qty 1000

## 2015-04-12 MED ORDER — VITAMIN K1 10 MG/ML IJ SOLN
1.0000 mg | Freq: Once | INTRAVENOUS | Status: AC
  Administered 2015-04-12: 1 mg via INTRAVENOUS
  Filled 2015-04-12: qty 0.1

## 2015-04-12 MED ORDER — PRO-STAT SUGAR FREE PO LIQD: 30.0000 mL | Freq: Three times a day (TID) | ORAL | Status: DC

## 2015-04-12 MED ORDER — CALCITRIOL 1 MCG/ML PO SOLN
0.5000 ug | Freq: Every day | ORAL | Status: DC
  Filled 2015-04-12: qty 0.5

## 2015-04-12 MED ORDER — PRISMASOL BGK 0/2.5 32-2.5 MEQ/L IV SOLN
INTRAVENOUS | Status: DC
  Administered 2015-04-13 (×2): via INTRAVENOUS_CENTRAL
  Filled 2015-04-12 (×9): qty 5000

## 2015-04-12 MED ORDER — NEPRO/CARBSTEADY PO LIQD
1000.0000 mL | ORAL | Status: DC
  Administered 2015-04-12: 1000 mL
  Filled 2015-04-12 (×3): qty 1000

## 2015-04-12 MED ORDER — DEXTROSE 50 % IV SOLN
INTRAVENOUS | Status: AC
  Filled 2015-04-12: qty 50

## 2015-04-12 MED ORDER — DOPAMINE-DEXTROSE 3.2-5 MG/ML-% IV SOLN
5.0000 ug/kg/min | INTRAVENOUS | Status: DC
  Administered 2015-04-13: 10 ug/kg/min via INTRAVENOUS
  Filled 2015-04-12: qty 250

## 2015-04-12 MED ORDER — NEPRO/CARBSTEADY PO LIQD: 1000.0000 mL | ORAL | Status: DC

## 2015-04-12 MED ORDER — SODIUM CHLORIDE 0.9 % IV SOLN
Freq: Once | INTRAVENOUS | Status: AC
  Administered 2015-04-12: 15:00:00 via INTRAVENOUS

## 2015-04-12 NOTE — Progress Notes (Signed)
      [address]       [place] [address]             [phone]      Mr. Walberg had further deterioration this afternoon  When attempting to start CVVH after only a few minutes the catheter clotted. TPA administered.  He became profoundly hypotensive requiring increase of levophed to 100.  He is more acidotic with a bicarb of 6 and a base deficit of 20. K is 6.4- has been given insulin, dextrose, calcium.  He needs dialysis urgently.   His echo showed LV dysfunction with an EF 25-30 % not surprising in the current state  Worsening acidosis is worrisome.  Revonda Standard Roxan Hockey, MD [facility] [phone]

## 2015-04-12 NOTE — Progress Notes (Signed)
      [address]       [place] [address]             [phone]      Patient had 2 cardiac arrests requiring CPR, epi, atropine and bicarb.  I had a discussion with the family and informed them of the dire prognosis with no hope for meaningful survival. During the meeting he had another arrest and was resuscitated by Dr. Nelda Marseille.  The family requested to meet with me again just now and informed me of their desire not to have further resuscitation done in the event of another arrest.  Will make DNR per family's wishes.  Will continue all current care measures for now  [place]. Roxan Hockey, MD [facility] [phone]

## 2015-04-12 NOTE — Progress Notes (Signed)
12 Days Post-Op Procedure(s) (LRB): REPAIR OF ASCENDING AORTIC DISSECTION, HYPOTHERMIC CIRCULATORY ARREST, RIGHT AXILLARY CANNULATION (N/A) Subjective: Intubated, sedated  Objective: Vital signs in last 24 hours: Temp:  [97 F (36.1 C)-98.6 F (37 C)] 98 F (36.7 C) (09/05 0400) Pulse Rate:  [90-142] 91 (09/05 0630) Cardiac Rhythm:  [-] A-V Sequential paced (09/05 0600) Resp:  [8-31] 23 (09/05 0630) BP: (116-125)/(57-73) 116/66 mmHg (09/04 1647) SpO2:  [92 %-100 %] 96 % (09/05 0630) Arterial Line BP: (62-162)/(39-113) 122/55 mmHg (09/05 0630) FiO2 (%):  [60 %-100 %] 60 % (09/05 0600) Weight:  [194 lb 7.1 oz (88.2 kg)] 194 lb 7.1 oz (88.2 kg) (09/05 0430)  Hemodynamic parameters for last 24 hours: CVP:  [6 mmHg] 6 mmHg  Intake/Output from previous day: 09/04 0701 - 09/05 0700 In: 2614.4 [I.V.:1824.4; IV Piggyback:750] Out: 700 [Emesis/NG output:700] Intake/Output this shift:    General appearance: sedated Heart: regular rate and rhythm Lungs: clear anteriorly Abdomen: nondistended, unable to assess tenderness accurately but no reaction with palpation Wound: serosanguinous drainage  Lab Results:  Recent Labs  04/11/15 1118 04/12/15 0411  WBC 42.4* 37.1*  HGB 7.6* 7.1*  HCT 22.5* 21.9*  PLT 197 190   BMET:  Recent Labs  04/11/15 0438 04/12/15 0411  NA 134* 134*  K 4.7 6.3*  CL 95* 95*  CO2 17* 15*  GLUCOSE 136* 47*  BUN 52* 68*  CREATININE 10.28* 11.61*  CALCIUM 7.7* 7.8*    PT/INR:  Recent Labs  04/12/15 0411  LABPROT 47.5*  INR 5.32*   ABG    Component Value Date/Time   PHART 7.284* 04/12/2015 0345   HCO3 14.9* 04/12/2015 0345   TCO2 15.9 04/12/2015 0345   ACIDBASEDEF 10.4* 04/12/2015 0345   O2SAT 47.3 04/12/2015 0612   CBG (last 3)   Recent Labs  04/11/15 0406 04/11/15 1223 04/11/15 1927  GLUCAP 130* 145* 116*    Assessment/Plan: S/P Procedure(s) (LRB): REPAIR OF ASCENDING AORTIC DISSECTION, HYPOTHERMIC CIRCULATORY ARREST, RIGHT  AXILLARY CANNULATION (N/A) remains critically ill septic with multiple organ system failure   CV- requiring higher doses of levophed to maintain BP overnight  Paced @ 90  Co-ox low/ CVP of 6- dopamine added, will give one unit of PRBC, give 2nd if dialyzed  Troponin max 1.37- likely secondary cardiac event insetting of arrest/ CPR/ shocks  RESP- VDRF, likely pneumonia  Vent per CCM, FiO2 lower than yesterday, CXR stable to slightly improved  CT showed small bilateral effusions, basilar consolidation/ infiltrates  RENAL- ESRD, dialysis dependent  Metabolic acidosis and hyperkalemia  GI- no intra-abdominal source id'ed on CT  Diarrhea- c diff sent on empiric flagyl  Liver function impaired with elevated PT and hypoglycemia  ID- remains septic with procalcitonin up to 42  On Vanco, aztreonam and flagyl for presumed pneumonia  HD catheter could also be a potential source   LOS: 12 days    Matthew Kane 04/12/2015

## 2015-04-12 NOTE — Progress Notes (Signed)
eLink Physician-Brief Progress Note Patient Name: Matthew Kane DOB: [birth year] MRN: [id number]   Date of Service  04/12/2015  HPI/Events of Note  K6.3 - on pressors, AKI HD delayed due t o clotted filter  eICU Interventions  Insulin/D50 Bicarb x 1 kayexalate     Intervention Category Major Interventions: Acute renal failure - evaluation and management;Electrolyte abnormality - evaluation and management  ALVA,RAKESH V. 04/12/2015, 3:22 PM

## 2015-04-12 NOTE — Progress Notes (Signed)
Dr Roxan Hockey called to update on pt, now on Levophed 40, CBG 41, Bicarb 14, K 6.3 and INR 5.32. Orders received.

## 2015-04-12 NOTE — Progress Notes (Addendum)
[facility] Progress Note   Subjective: K up 6.3 today, last HD Friday  Filed Vitals:   04/12/15 0728 04/12/15 0800 04/12/15 0833 04/12/15 0930  BP: 105/47     Pulse: 91 91 91 91  Temp:  98.1 F (36.7 C) 97.9 F (36.6 C) 97.3 F (36.3 C)  TempSrc:  Axillary Axillary Axillary  Resp: 21 20 20 19   Height:      Weight:      SpO2: 95% 94% 96% 93%   Exam: On vent, sedated No jvd Chest coarse BS bilat RRR no MRG Abd nondistended, dec'd BS, no ascites LE no sig edema Neuro is sedated on vent R IJ cath  [facility]  TTS 88kg  2/2 bath  Heparin 10K   R IJ cath Calcitriol 1 ug Mircera 75 q 2, last 8.23 Venofer 100 x 5  ABG 7.28/ 32   Na 134 K 6.3  CO2 15  Creat 11  BUN 68  Alb 2.2   AST 10647, ALT 4218  Tbili 2.2 CXR today > bibasilar effusions, no edema     Assessment: 1 ESRD 2 Hyperkalemia 3 Shock/ prob sepsis w PNA, on pressors 4 +troponin/ sp PEA arrest 5 VDRF 6 Shock liver 7 TAA repair 8 Coagulopathy / Jeannie Done on coumadin  Plan - CRRT today, no hep    Kelly Splinter MD  pager [phone]    cell [phone]  04/12/2015, 11:23 AM     Recent Labs Lab 04/11/15 0137 04/11/15 0220 04/11/15 0438 04/12/15 0411  NA 134* 134* 134* 134*  K 4.9 4.4 4.7 6.3*  CL 97* 100* 95* 95*  CO2 18*  --  17* 15*  GLUCOSE 114* 115* 136* 47*  BUN 53* 54* 52* 68*  CREATININE 10.78* 9.50* 10.28* 11.61*  CALCIUM 8.8*  --  7.7* 7.8*  PHOS 9.3*  --  10.8* 11.4*    Recent Labs Lab 04/11/15 0137 04/11/15 0438 04/12/15 0411 04/12/15 0800  AST 316*  --   --  10647*  ALT 209*  --   --  4218*  ALKPHOS 63  --   --  119  BILITOT 1.0  --   --  2.2*  PROT 5.6*  --   --  4.4*  ALBUMIN 2.4* 2.0* 2.3* 2.2*    Recent Labs Lab 04/11/15 0137 04/11/15 0220 04/11/15 1118 04/12/15 0411  WBC 19.9*  --  42.4* 37.1*  NEUTROABS 16.8*  --   --   --   HGB 8.6* 7.8* 7.6* 7.1*  HCT 26.9* 23.0* 22.5* 21.9*  MCV 82.3  --  79.8 81.7  PLT 171  --  197 190   . sodium chloride    Intravenous Once  . antiseptic oral rinse  7 mL Mouth Rinse QID  . aspirin  324 mg Per Tube Daily  . aztreonam  500 mg Intravenous Q12H  . calcitRIOL  0.5 mcg Oral Daily  . chlorhexidine gluconate  15 mL Mouth Rinse BID  . darbepoetin (ARANESP) injection - NON-DIALYSIS  100 mcg Subcutaneous Q Sat-1800  . feeding supplement (NEPRO CARB STEADY)  1,000 mL Per Tube Q24H  . feeding supplement (PRO-STAT SUGAR FREE 64)  60 mL Per Tube TID  . ferric gluconate (FERRLECIT/NULECIT) IV  125 mg Intravenous Q T,Th,Sa-HD  . metronidazole  500 mg Intravenous Q6H  . multivitamin  1 tablet Oral QHS  . pantoprazole (PROTONIX) IV  40 mg Intravenous Daily  . sodium chloride  3 mL Intravenous Q12H   . sodium  chloride    . amiodarone 30 mg/hr (04/12/15 1100)  . dextrose 5 % and 0.9% NaCl 20 mL/hr at 04/12/15 1100  . DOPamine 5 mcg/kg/min (04/12/15 1100)  . norepinephrine (LEVOPHED) Adult infusion 40 mcg/min (04/12/15 1100)  . vasopressin (PITRESSIN) infusion - *FOR SHOCK* 0.03 Units/min (04/12/15 1100)   Place/Maintain arterial line **AND** sodium chloride, acetaminophen, fentaNYL (SUBLIMAZE) injection, LORazepam, ondansetron (ZOFRAN) IV, sodium chloride

## 2015-04-12 NOTE — Progress Notes (Signed)
Called after patient had a cardiac arrest.  Primary bedside.  ROSC noted.  Patient on multiple pressors.  Labs reviewed and addressed.  Patient will not survive unless dialysis of some sort is started.  They were planning on doing HD.  The patient will not survive HD given pressors demand.  Spoke with Dr. Lorrene Reid from renal.  Will increase bicarb flow rate to 250 ml/hr.  Run CRRT even and will adjust dialysate to 2K instead of 4K.  Will perform bicarb in pre and post.  Primary spoke with family.  Wishing for full support.  Additional CC time of 60 minutes.  Rush Farmer, M.D. [facility] Pulmonary/Critical Care Medicine. Pager: [phone]. After hours pager: [phone].

## 2015-04-12 NOTE — Progress Notes (Signed)
Hypoglycemic Event  CBG: 41  Treatment: D50 IV 25 mL  Symptoms: None  Follow-up CBG: [id number] CBG Result:83  Possible Reasons for Event: Unknown  Comments/MD notified:Dr Byrum notified    Matthew Kane  Remember to initiate Hypoglycemia Order Set & complete

## 2015-04-12 NOTE — Progress Notes (Signed)
[facility] On Call Note  Events of last 2 hours noted. Pt now hypotensive and on 4 pressors. Profound metabolic acidosis with pH of 7, K of 6.6, bicarb of 7. He will not tolerate hemodialysis.  I am told that tunnelled catheter is now working (post TPA)  Will proceed with CRRT 2K dialysate/4K replacement fluids Peripheral isotonic bicarbonate - to run for now at 250 cc/hour and can be adjusted by CCM as necessary for management of acidosis. Keep volume even.  In the event that the [facility] stops working again he would need placement by CCM of a temporary catheter in order to facilitate CRRT.  Jamal Maes, MD [facility] [phone] Pager 04/12/2015, 6:41 PM

## 2015-04-12 NOTE — Progress Notes (Signed)
[place] for heparin  Indication: RUE DVT  Allergies  Allergen Reactions  . Penicillins Itching and Rash   Patient Measurements: Height: 6' (182.9 cm) Weight: 179 lb 7.3 oz (81.4 kg) IBW/kg (Calculated) : 77.6  Vital Signs: Temp: 98.6 F (37 C) (09/05 0000) Temp Source: Axillary (09/05 0000) BP: 116/66 mmHg (09/04 1647) Pulse Rate: 91 (09/04 2330)  Labs:  Recent Labs  04/10/15 0230 04/10/15 0240  04/10/15 2225 04/11/15 0137 04/11/15 0220 04/11/15 0438 04/11/15 0441 04/11/15 1118 04/11/15 1423 04/11/15 2354  HGB  --  8.9*  --   --  8.6* 7.8*  --   --  7.6*  --   --   HCT  --  27.5*  --   --  26.9* 23.0*  --   --  22.5*  --   --   PLT  --  150  --   --  171  --   --   --  197  --   --   APTT  --   --   --   --  >200*  --   --   --   --   --   --   LABPROT 17.0*  --   --   --  22.4*  --   --   --  31.3*  --   --   INR 1.37  --   --   --  1.98*  --   --   --  3.09*  --   --   HEPARINUNFRC 0.82*  --   < > 0.92*  --   --   --  1.60*  --   --  0.12*  CREATININE  --   --   --   --  10.78* 9.50* 10.28*  --   --   --   --   TROPONINI  --   --   --   --   --   --  0.42*  --  0.91* 1.37*  --   < > = values in this interval not displayed. Estimated Creatinine Clearance: 8.1 mL/min (by C-G formula based on Cr of 10.28).  Assessment: 64 y/o male with RUE DVT for heparin.   Goal of Therapy:  INR 2-3 Heparin level 0.3-0.7 units/ml Monitor platelets by anticoagulation protocol: Yes   Plan:  Increase Heparin 1150 units/hr Follow-up am labs.    Phillis Knack, PharmD, BCPS  04/12/2015 1:11 AM

## 2015-04-12 NOTE — Progress Notes (Signed)
  Echocardiogram 2D Echocardiogram has been performed.  Matthew Kane 04/12/2015, 3:43 PM

## 2015-04-12 NOTE — Progress Notes (Signed)
PT Cancellation Note  Patient Details Name: Matthew Kane MRN: [id number] DOB: [birth year]   Cancelled Treatment:    Reason Eval/Treat Not Completed: Medical issues which prohibited therapy (remains in shock: on levo and vasopressin) Will sign off, please reconsult when medically appropriate   Duncan Dull 04/12/2015, 11:37 AM Alben Deeds, PT DPT  [phone]

## 2015-04-12 NOTE — Progress Notes (Signed)
Received call from [facility] regarding critical lab value K = 6.4 and notified Dr. Elsworth Soho eMD.

## 2015-04-12 NOTE — Procedures (Signed)
CPR Note  Was bedside with patient observing due to refractory shock.  Patient had a sudden asystolic arrest.  Please see code sheet for details.  Epi/atropine/bicarb given.  ROSC.  Pressors increased.   Rush Farmer, M.D. [facility] Pulmonary/Critical Care Medicine. Pager: [phone]. After hours pager: [phone].

## 2015-04-12 NOTE — Progress Notes (Signed)
Dr Deterding notified of K 6.3, Creat 11.3, and pt become more unstable. No new orders received.

## 2015-04-12 NOTE — Progress Notes (Signed)
CRITICAL VALUE ALERT  Critical value received:  K 6.3  Date of notification:  04/12/2015  Time of notification:  0504  Critical value read back:Yes.    Nurse who received alert:  Glenford Peers RN  MD notified (1st page):  Dr Lamonte Sakai  Time of first page:  0506  MD notified (2nd page):  Time of second page:  Responding MD:  Dr Lamonte Sakai  Time MD responded:  [phone]

## 2015-04-12 NOTE — Progress Notes (Signed)
Chaplain responded to request from RN for family support.  Sisters, brother, son and nieces present in conference room.   RN supervisor escorted family members to pt. Bedside.  They were unclear upon return whether he was still alive. Chaplain confirmed with RN that pt. Was still alive, but very critical.  Comfort measures, spiritual conversation, memory-sharing.    Will follow   Rev. [place], [place]

## 2015-04-12 NOTE — Progress Notes (Signed)
Nutrition Follow-up   INTERVENTION:   Initiate Nepro @ 20 ml/hr via OG tube and increase by 10 ml every 4 hours to goal rate of 35 ml/hr.   60 ml Prostat TID.    Tube feeding regimen provides 2112 kcal, 158 grams of protein, and 610 ml of H2O.    NUTRITION DIAGNOSIS:   Increased nutrient needs related to  (sepsis and dialysis ) as evidenced by estimated needs.  ongoing  GOAL:   Patient will meet greater than or equal to 90% of their needs  Not met  MONITOR:   TF tolerance, I & O's, Labs, Weight trends, Vent status  REASON FOR ASSESSMENT:   Consult Enteral/tube feeding initiation and management  ASSESSMENT:   64 yo Male with PMH of ESRD on HD, HTN, GERD, BPH, OSA who is transferred to [facility] tonight from [facility] ED for emergent cardiac cath. He presented to the ED at [facility] today with c/o chest pain and nausea that started around 5pm. EKG with ST elevation in the precordial leads. Code STEMI called. At time of arrival to [facility], pt c/o ongoing chest pain. Mild SOB. He has been on ESRD since 2012. Cardiac cath in 2006 with minimal CAD. No cardiac issues since then. He has seen Dr. Lattie Haw in our [place] office in 2013 for pre-operative evaluation  8/28 Pt extubated and started on diet. 9/4 pt with cardiac arrest (believed to be from sepsis/PNA) and re-intubated Pt on HD but may change to CRRT.  Potassium 6.3 and Phosphorus 11.4 Pt with loose stools now with rectal tube in place  Patient is currently intubated on ventilator support MV: 13.4 L/min Temp (24hrs), Avg:98 F (36.7 C), Min:97.3 F (36.3 C), Max:98.6 F (37 C)  Pt on pressors at stable rate.   Diet Order:  Diet NPO time specified  Skin:  Reviewed, no issues  Last BM:  9/4 large; loose rectal tube placed 9/4  Height:   Ht Readings from Last 1 Encounters:  04/01/15 6' (1.829 m)    Weight:   Wt Readings from Last 1 Encounters:  04/12/15 194 lb 7.1 oz (88.2 kg)    Ideal Body Weight:  81  kg  BMI:  Body mass index is 26.37 kg/(m^2).  Estimated Nutritional Needs:   Kcal:  2032  Protein:  130-150 grams  Fluid:  per MD  EDUCATION NEEDS:   No education needs identified at this time  [place], [place], [place] Pager [phone] After Hours Pager

## 2015-04-12 NOTE — Progress Notes (Signed)
[facility] Progress Note Patient Name: DOYL BITTING DOB: [birth year] MRN: [id number]   Date of Service  04/12/2015  HPI/Events of Note  Note metabolic disarray, anticipate that he will be dialyzed today. Will start low dose d5NS given hypoglycemia.   eICU Interventions       Intervention Category Intermediate Interventions: Other:  BYRUM,ROBERT S. 04/12/2015, 5:10 AM

## 2015-04-13 ENCOUNTER — Inpatient Hospital Stay (HOSPITAL_COMMUNITY): Payer: Medicare HMO

## 2015-04-13 LAB — PREPARE PLATELET PHERESIS: Unit division: 0

## 2015-04-13 LAB — GLUCOSE, CAPILLARY
GLUCOSE-CAPILLARY: 120 mg/dL — AB (ref 65–99)
GLUCOSE-CAPILLARY: 140 mg/dL — AB (ref 65–99)
GLUCOSE-CAPILLARY: 125 mg/dL — AB (ref 65–99)
Glucose-Capillary: 210 mg/dL — ABNORMAL HIGH (ref 65–99)

## 2015-04-13 LAB — PREPARE FRESH FROZEN PLASMA
UNIT DIVISION: 0
Unit division: 0

## 2015-04-13 LAB — TYPE AND SCREEN
ABO/RH(D): B POS
Antibody Screen: NEGATIVE
UNIT DIVISION: 0
UNIT DIVISION: 0
Unit division: 0
Unit division: 0

## 2015-04-13 MED FILL — Medication: Qty: 1 | Status: AC

## 2015-04-16 LAB — CULTURE, BLOOD (ROUTINE X 2)
CULTURE: NO GROWTH
Culture: NO GROWTH

## 2015-05-08 NOTE — Progress Notes (Signed)
Time of death called at 77.  Absence of heart sounds listened to by myself and 2nd RN.  Archie Endo, RN and Vista Lawman, RN.

## 2015-05-08 NOTE — Discharge Summary (Signed)
Physician Discharge Summary  Patient ID: Matthew Kane MRN: [id number] DOB/AGE: [birth year] 64 y.o.  Admit date: 03/20/2015 Discharge date: 26-Apr-2015  Admission Diagnoses:  Uncontrolled hypertension with hypertensive crisis Acute type A aortic dissection End stage renal disease on hemodialysis  Discharge Diagnoses:  Active Problems:   Hypertensive crisis   Precordial pain   Aortic dissection   Acute respiratory failure with hypoxia   Cardiac arrest   Discharged Condition: deceased  Hospital Course:   64 yo male with history of ESRD on HD, HTN, GERD, BPH, OSA who presented to the ED at [facility]  with c/o chest pain and nausea that started around 5pm. EKG with ST elevation in the precordial leads. Code STEMI called. At time of arrival to [facility], pt c/o ongoing chest pain. Mild SOB. He has been on ESRD since 2012. Cardiac cath in 2006 with minimal CAD. Cath now showed no significant coronary artery disease. Since he had uncontrolled HTN and ongoing chest pain a CTA of the chest was obtained that showed an acute type A aortic dissection. He was taken urgently to the operating room on 8/25 and underwent replacement of the ascending aorta and proximal aortic arch (Hemi-arch) using a 30 mm Hemashield supra-coronary tube graft under deep hypothermic circulatory arrest. His surgery was uneventful and he was returned to the SICU. He woke up from surgery and was extubated but remained confused and intermittently agitated. He was dialyzed and over the next few days seemed to have improving mental status. He remained hemodynamically stable. In the early morning hours of 04/11/2015 he became more agitated and combative and then had agonal respirations and bradycardia. He required intubation and resuscitation with defibrillation for V-fib. He developed hypotension requiring levophed and vasopressin. He was started on antibiotics for possible pneumonia and aspiration. Head CT showed chronic small vessel  disease but no acute abnormality. He woke up and was following commands but remained in shock with suspected sepsis. An echo showed marked LV dysfunction with an EF of 25-30%. He developed worsening acidosis and had 2 episodes of cardiac arrest requiring CPR, epinephrine, bicarb and atropine. After discussion with the family a decision was made to make him a DNR and he expired at 0344 on 04-26-2015.  Consults: cardiology, pulmonary/intensive care and nephrology   Disposition: 20-Expired  Signed: Gaye Pollack 05/06/2015, 3:37 PM

## 2015-05-08 DEATH — deceased

## 2015-12-15 IMAGING — CR DG ABD PORTABLE 1V
1 series · 1 of 1 positions shown · non-contrast
Comparison: 03/31/2015 CT

CLINICAL DATA: OG tube insertion.  Status post code.

EXAM:
PORTABLE ABDOMEN - 1 VIEW

[AP]
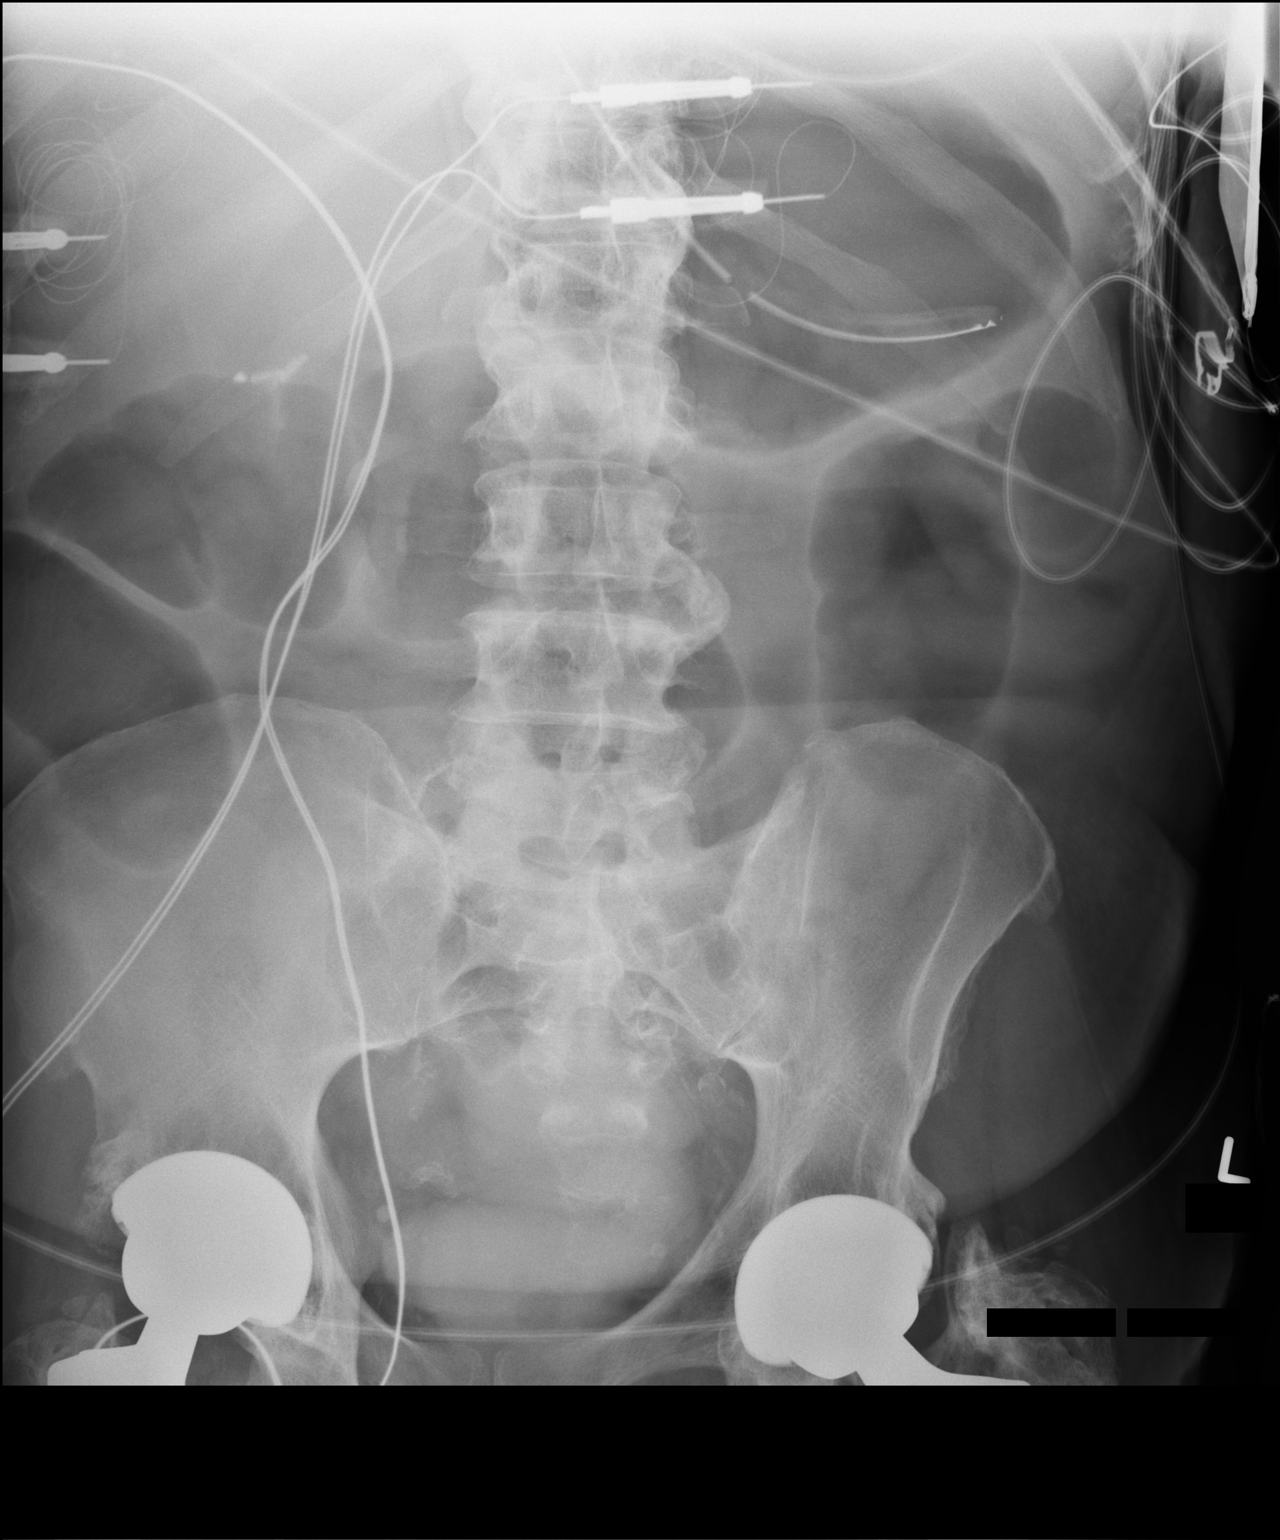

[1 of 1 positions shown; findings below may reference images not displayed]

FINDINGS: The tip of an OG tube is noted overlying the proximal -mid stomach.

Gas in the colon is noted.

No dilated small bowel loops are present.

Bilateral hip replacements are identified.

No acute bony abnormalities are noted.
IMPRESSION: OG tube with tip overlying the proximal -mid stomach.
